# Patient Record
Sex: Male | Born: 1950
Health system: Southern US, Community
[De-identification: ages and names within clinical notes are randomized; demographics above are authoritative.]

## PROBLEM LIST (undated history)

## (undated) DIAGNOSIS — Z789 Other specified health status: Secondary | ICD-10-CM

## (undated) DIAGNOSIS — I700 Atherosclerosis of aorta: Secondary | ICD-10-CM

## (undated) DIAGNOSIS — K5790 Diverticulosis of intestine, part unspecified, without perforation or abscess without bleeding: Secondary | ICD-10-CM

## (undated) DIAGNOSIS — R0600 Dyspnea, unspecified: Secondary | ICD-10-CM

## (undated) DIAGNOSIS — I2510 Atherosclerotic heart disease of native coronary artery without angina pectoris: Secondary | ICD-10-CM

## (undated) DIAGNOSIS — C679 Malignant neoplasm of bladder, unspecified: Secondary | ICD-10-CM

## (undated) DIAGNOSIS — M1990 Unspecified osteoarthritis, unspecified site: Secondary | ICD-10-CM

## (undated) DIAGNOSIS — H269 Unspecified cataract: Secondary | ICD-10-CM

## (undated) DIAGNOSIS — E785 Hyperlipidemia, unspecified: Secondary | ICD-10-CM

## (undated) DIAGNOSIS — J309 Allergic rhinitis, unspecified: Secondary | ICD-10-CM

## (undated) DIAGNOSIS — Z9889 Other specified postprocedural states: Secondary | ICD-10-CM

## (undated) DIAGNOSIS — R112 Nausea with vomiting, unspecified: Secondary | ICD-10-CM

## (undated) DIAGNOSIS — F419 Anxiety disorder, unspecified: Secondary | ICD-10-CM

## (undated) DIAGNOSIS — F32A Depression, unspecified: Secondary | ICD-10-CM

## (undated) HISTORY — DX: Unspecified cataract: H26.9

## (undated) HISTORY — DX: Depression, unspecified: F32.A

## (undated) HISTORY — DX: Anxiety disorder, unspecified: F41.9

## (undated) HISTORY — PX: FRACTURE SURGERY: SHX138

## (undated) HISTORY — DX: Malignant neoplasm of bladder, unspecified: C67.9

---

## 1971-06-12 HISTORY — PX: APPENDECTOMY: SHX54

## 1985-06-11 HISTORY — PX: VASECTOMY: SHX75

## 1998-06-11 HISTORY — PX: HERNIA REPAIR: SHX51

## 1999-06-12 HISTORY — PX: FINGER SURGERY: SHX640

## 2004-06-01 ENCOUNTER — Ambulatory Visit: Payer: Self-pay | Admitting: Internal Medicine

## 2007-01-28 ENCOUNTER — Ambulatory Visit: Payer: Self-pay | Admitting: General Surgery

## 2007-06-12 HISTORY — PX: SHOULDER SURGERY: SHX246

## 2008-11-23 ENCOUNTER — Ambulatory Visit: Payer: Self-pay | Admitting: Family Medicine

## 2009-01-03 ENCOUNTER — Ambulatory Visit: Payer: Self-pay | Admitting: Gastroenterology

## 2010-09-26 ENCOUNTER — Observation Stay: Payer: Self-pay | Admitting: Internal Medicine

## 2011-01-15 ENCOUNTER — Ambulatory Visit: Payer: Self-pay | Admitting: Family Medicine

## 2013-03-09 ENCOUNTER — Ambulatory Visit: Payer: Self-pay | Admitting: Family Medicine

## 2015-04-07 DIAGNOSIS — L821 Other seborrheic keratosis: Secondary | ICD-10-CM | POA: Diagnosis not present

## 2015-04-07 DIAGNOSIS — D225 Melanocytic nevi of trunk: Secondary | ICD-10-CM | POA: Diagnosis not present

## 2015-04-07 DIAGNOSIS — D2271 Melanocytic nevi of right lower limb, including hip: Secondary | ICD-10-CM | POA: Diagnosis not present

## 2015-04-07 DIAGNOSIS — D2272 Melanocytic nevi of left lower limb, including hip: Secondary | ICD-10-CM | POA: Diagnosis not present

## 2015-12-29 ENCOUNTER — Telehealth: Payer: Self-pay | Admitting: Family Medicine

## 2015-12-29 NOTE — Telephone Encounter (Signed)
Pt states he is getting ready to have a grand baby and is need to to verify if he has had the vaccine for Varicella.  [id number]

## 2015-12-29 NOTE — Telephone Encounter (Signed)
No record of Tdap or varicella? documented in EMR. LMTCB to advise pt. Renaldo Fiddler, CMA

## 2015-12-30 ENCOUNTER — Ambulatory Visit (INDEPENDENT_AMBULATORY_CARE_PROVIDER_SITE_OTHER): Payer: Medicare Other | Admitting: Family Medicine

## 2015-12-30 ENCOUNTER — Other Ambulatory Visit: Payer: Self-pay

## 2015-12-30 DIAGNOSIS — G4733 Obstructive sleep apnea (adult) (pediatric): Secondary | ICD-10-CM | POA: Insufficient documentation

## 2015-12-30 DIAGNOSIS — R319 Hematuria, unspecified: Secondary | ICD-10-CM | POA: Insufficient documentation

## 2015-12-30 DIAGNOSIS — E785 Hyperlipidemia, unspecified: Secondary | ICD-10-CM | POA: Insufficient documentation

## 2015-12-30 DIAGNOSIS — X58XXXA Exposure to other specified factors, initial encounter: Secondary | ICD-10-CM | POA: Insufficient documentation

## 2015-12-30 DIAGNOSIS — H6980 Other specified disorders of Eustachian tube, unspecified ear: Secondary | ICD-10-CM | POA: Insufficient documentation

## 2015-12-30 DIAGNOSIS — Z23 Encounter for immunization: Secondary | ICD-10-CM

## 2015-12-30 DIAGNOSIS — J309 Allergic rhinitis, unspecified: Secondary | ICD-10-CM | POA: Insufficient documentation

## 2016-01-02 NOTE — Progress Notes (Signed)
Patient ID: Francis Jones, male   DOB: 12/31/1950, 65 y.o.   MRN: [id number] Here for CMA vist for Tdap only.

## 2016-05-01 ENCOUNTER — Ambulatory Visit (INDEPENDENT_AMBULATORY_CARE_PROVIDER_SITE_OTHER): Payer: Medicare Other | Admitting: Family Medicine

## 2016-05-01 DIAGNOSIS — Z23 Encounter for immunization: Secondary | ICD-10-CM | POA: Diagnosis not present

## 2016-05-23 DIAGNOSIS — D2261 Melanocytic nevi of right upper limb, including shoulder: Secondary | ICD-10-CM | POA: Diagnosis not present

## 2016-05-23 DIAGNOSIS — X32XXXA Exposure to sunlight, initial encounter: Secondary | ICD-10-CM | POA: Diagnosis not present

## 2016-05-23 DIAGNOSIS — L570 Actinic keratosis: Secondary | ICD-10-CM | POA: Diagnosis not present

## 2016-05-23 DIAGNOSIS — L300 Nummular dermatitis: Secondary | ICD-10-CM | POA: Diagnosis not present

## 2016-05-23 DIAGNOSIS — D2272 Melanocytic nevi of left lower limb, including hip: Secondary | ICD-10-CM | POA: Diagnosis not present

## 2016-05-23 DIAGNOSIS — D225 Melanocytic nevi of trunk: Secondary | ICD-10-CM | POA: Diagnosis not present

## 2016-11-20 ENCOUNTER — Encounter: Payer: Self-pay | Admitting: Family Medicine

## 2016-11-20 ENCOUNTER — Ambulatory Visit (INDEPENDENT_AMBULATORY_CARE_PROVIDER_SITE_OTHER): Payer: Medicare Other | Admitting: Family Medicine

## 2016-11-20 ENCOUNTER — Other Ambulatory Visit: Payer: Self-pay | Admitting: Family Medicine

## 2016-11-20 VITALS — BP 126/92 | HR 58 | Temp 97.5°F | Resp 16 | Wt 265.0 lb

## 2016-11-20 DIAGNOSIS — H8110 Benign paroxysmal vertigo, unspecified ear: Secondary | ICD-10-CM

## 2016-11-20 MED ORDER — MECLIZINE HCL 25 MG PO TABS: 25.0000 mg | ORAL_TABLET | Freq: Three times a day (TID) | ORAL | 0 refills | Status: DC | PRN

## 2016-11-20 NOTE — Patient Instructions (Addendum)
We will call you with the lab results. Consider adding a steroid nasal spray for your allergy symptoms.

## 2016-11-20 NOTE — Progress Notes (Signed)
Subjective:     Patient ID: Francis Jones, male   DOB: [birth year], 66 y.o.   MRN: [id number]  HPI  Chief Complaint  Patient presents with  . Hypertension    Patient comes in office today with concerns of elevated blood pressure for the past 2 hrs. Patient states that when he awoke this morning he felt dizzy, patient states that blood pressure was 168/95. Patient denies being under any more stress or starting of new medication or past history of hypertension. Associated with dizziness patient reports 1 episode of vomiting this morning.   States he started getting dizzy last night when he went to bed. When he awoke lying on his back this AM he experienced vertigo. This improved after he got up but has continued to feel inwardly dizzy and off-balance. No change in hearing but reports chronic tinnitus (has had prior ENT evaluation). Also reports mild allergy sx and mild left upper tooth discomfort (has had negative dental x-rays).   Review of Systems     Objective:   Physical Exam  Constitutional: He appears well-developed and well-nourished. He has a sickly appearance.  HENT:  Ear canals patent and TM's intact without inflammation  Eyes: EOM are normal. Pupils are equal, round, and reactive to light.  EOM testing provokes vertigo to left gaze  Neck:  No carotid bruits  Cardiovascular: Normal rate and regular rhythm.   Pulmonary/Chest: Breath sounds normal.  Neurological: Coordination (Finger to Nose and heel to toe WNL. [place] negative) normal.       Assessment:    1. Benign paroxysmal positional vertigo, unspecified laterality - meclizine (ANTIVERT) 25 MG tablet; Take 1 tablet (25 mg total) by mouth 3 (three) times daily as needed for dizziness.  Dispense: 30 tablet; Refill: 0 - Renal function panel    Plan:    Further f/u pending lab work. Not clear why he is on potassium supplement.

## 2016-11-21 LAB — RENAL FUNCTION PANEL
Albumin: 4.4 g/dL (ref 3.6–4.8)
BUN/Creatinine Ratio: 16 (ref 10–24)
BUN: 13 mg/dL (ref 8–27)
CO2: 25 mmol/L (ref 20–29)
CREATININE: 0.83 mg/dL (ref 0.76–1.27)
Calcium: 9.4 mg/dL (ref 8.6–10.2)
Chloride: 104 mmol/L (ref 96–106)
GFR calc Af Amer: 106 mL/min/{1.73_m2} (ref >59)
GFR, EST NON AFRICAN AMERICAN: 92 mL/min/{1.73_m2} (ref >59)
Glucose: 122 mg/dL — ABNORMAL HIGH (ref 65–99)
PHOSPHORUS: 3.1 mg/dL (ref 2.5–4.5)
Potassium: 4.8 mmol/L (ref 3.5–5.2)
SODIUM: 142 mmol/L (ref 134–144)

## 2016-11-27 ENCOUNTER — Other Ambulatory Visit: Payer: Self-pay | Admitting: Family Medicine

## 2016-11-27 ENCOUNTER — Telehealth: Payer: Self-pay | Admitting: Family Medicine

## 2016-11-27 DIAGNOSIS — H8110 Benign paroxysmal vertigo, unspecified ear: Secondary | ICD-10-CM

## 2016-11-27 NOTE — Telephone Encounter (Signed)
Will refer to ENT, Dr.Vaught.His positional vertigo persists.

## 2016-11-27 NOTE — Telephone Encounter (Signed)
Please review. KW 

## 2016-11-27 NOTE — Telephone Encounter (Signed)
Pt would like to talk to [facility] regarding his vertigo.  Pt's call back  [phone]  [facility]

## 2016-12-03 DIAGNOSIS — H6980 Other specified disorders of Eustachian tube, unspecified ear: Secondary | ICD-10-CM | POA: Diagnosis not present

## 2016-12-03 DIAGNOSIS — H8112 Benign paroxysmal vertigo, left ear: Secondary | ICD-10-CM | POA: Diagnosis not present

## 2016-12-03 DIAGNOSIS — H9312 Tinnitus, left ear: Secondary | ICD-10-CM | POA: Diagnosis not present

## 2016-12-03 DIAGNOSIS — R42 Dizziness and giddiness: Secondary | ICD-10-CM | POA: Diagnosis not present

## 2016-12-10 DIAGNOSIS — H8111 Benign paroxysmal vertigo, right ear: Secondary | ICD-10-CM | POA: Diagnosis not present

## 2016-12-19 DIAGNOSIS — H8111 Benign paroxysmal vertigo, right ear: Secondary | ICD-10-CM | POA: Diagnosis not present

## 2017-02-08 ENCOUNTER — Encounter: Payer: Self-pay | Admitting: Family Medicine

## 2017-03-26 ENCOUNTER — Encounter: Payer: Self-pay | Admitting: Family Medicine

## 2017-05-22 DIAGNOSIS — L821 Other seborrheic keratosis: Secondary | ICD-10-CM | POA: Diagnosis not present

## 2017-05-22 DIAGNOSIS — D225 Melanocytic nevi of trunk: Secondary | ICD-10-CM | POA: Diagnosis not present

## 2017-05-22 DIAGNOSIS — D2272 Melanocytic nevi of left lower limb, including hip: Secondary | ICD-10-CM | POA: Diagnosis not present

## 2017-05-22 DIAGNOSIS — X32XXXA Exposure to sunlight, initial encounter: Secondary | ICD-10-CM | POA: Diagnosis not present

## 2017-05-22 DIAGNOSIS — L570 Actinic keratosis: Secondary | ICD-10-CM | POA: Diagnosis not present

## 2017-05-22 DIAGNOSIS — D2261 Melanocytic nevi of right upper limb, including shoulder: Secondary | ICD-10-CM | POA: Diagnosis not present

## 2017-09-11 DIAGNOSIS — H2513 Age-related nuclear cataract, bilateral: Secondary | ICD-10-CM | POA: Diagnosis not present

## 2017-12-20 ENCOUNTER — Encounter: Payer: Self-pay | Admitting: Family Medicine

## 2017-12-20 ENCOUNTER — Telehealth: Payer: Self-pay | Admitting: Family Medicine

## 2017-12-20 NOTE — Telephone Encounter (Signed)
Opened in error

## 2017-12-23 ENCOUNTER — Telehealth: Payer: Self-pay | Admitting: Family Medicine

## 2017-12-23 ENCOUNTER — Other Ambulatory Visit: Payer: Self-pay | Admitting: Family Medicine

## 2017-12-23 DIAGNOSIS — R310 Gross hematuria: Secondary | ICD-10-CM

## 2017-12-23 NOTE — Telephone Encounter (Signed)
Referral in progress. 

## 2017-12-23 NOTE — Telephone Encounter (Signed)
Pt states he has blood in the urine again.  States since he has spoke with Mikki Santee it has cleared up but was referred to [place] by Mikki Santee.  Pt states he called [facility] and they are requesting a referral sent over from our office.

## 2017-12-23 NOTE — Telephone Encounter (Signed)
Please review and advise. KW 

## 2018-01-06 NOTE — Progress Notes (Signed)
01/07/2018 2:25 PM   Francis Jones [birth year] [id number]  Referring provider: Carmon Ginsberg, [address] [place] [address] [place], [place] [address]  Chief Complaint  Patient presents with  . Hematuria    HPI: Patient is a 67 -year-old Caucasian male who presents today as a referral from their Carmon Ginsberg, [place] for gross hematuria.    Patient states that he had an incident of gross hematuria that abated three years ago.    He had the return of the gross hematuria with the passage of clots.  He increased his water intake.  It has since cleared.    He does not have a prior history of recurrent urinary tract infections, nephrolithiasis, trauma to the genitourinary tract, BPH or malignancies of the genitourinary tract.   He does not have a family medical history of nephrolithiasis, malignancies of the genitourinary tract or hematuria.   Today, he is having urgency.  Patient denies any gross hematuria, dysuria or suprapubic/flank pain.  Patient denies any fevers, chills, nausea or vomiting. His UA today demonstrates >30 RBC's.    He is a former smoker, with a 2 ppd history.  Quit 40 years ago.  He is not exposed to secondhand smoke.  He has not worked with chemicals.   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  He has a high BMI.     PMH: History reviewed. No pertinent past medical history.  Surgical History: Past Surgical History:  Procedure Laterality Date  . APPENDECTOMY  1973  . FINGER SURGERY Left 2001  . HERNIA REPAIR  2000  . SHOULDER SURGERY Left 2009  . VASECTOMY  1987    Home Medications:  Allergies as of 01/07/2018      Reactions   Oxycodone Nausea Only   Hydrocodone-acetaminophen Nausea And Vomiting   Other Nausea Only, Other (See Comments)   Drugs that end in "one"      Medication List        Accurate as of 01/07/18  2:25 PM. Always use your most recent med list.          ALLEGRA ALLERGY 60 MG  tablet Generic drug:  fexofenadine Take by mouth.   aspirin 81 MG tablet Take by mouth.   cholecalciferol 1000 units tablet Commonly known as:  VITAMIN D Take by mouth.   Cyanocobalamin 1000 MCG Caps Take by mouth.   loratadine 10 MG tablet Commonly known as:  CLARITIN Take by mouth.   meclizine 25 MG tablet Commonly known as:  ANTIVERT Take 1 tablet (25 mg total) by mouth 3 (three) times daily as needed for dizziness.   MULTIPLE VITAMIN PO Take by mouth.   OMEGA-3 FATTY ACIDS PO Take by mouth.   potassium chloride 10 MEQ CR capsule Commonly known as:  MICRO-K Take by mouth.       Allergies:  Allergies  Allergen Reactions  . Oxycodone Nausea Only  . Hydrocodone-Acetaminophen Nausea And Vomiting  . Other Nausea Only and Other (See Comments)    Drugs that end in "one"    Family History: History reviewed. No pertinent family history.  Social History:  reports that he has never smoked. He has never used smokeless tobacco. His alcohol and drug histories are not on file.  ROS: UROLOGY Frequent Urination?: No Hard to postpone urination?: Yes Burning/pain with urination?: No Get up at night to urinate?: No Leakage of urine?: No Urine stream starts and stops?: No Trouble starting  stream?: No Do you have to strain to urinate?: No Blood in urine?: Yes Urinary tract infection?: No Sexually transmitted disease?: No Injury to kidneys or bladder?: No Painful intercourse?: No Weak stream?: No Erection problems?: No Penile pain?: No  Gastrointestinal Nausea?: No Vomiting?: No Indigestion/heartburn?: No Diarrhea?: No Constipation?: Yes  Constitutional Fever: No Night sweats?: No Weight loss?: No Fatigue?: No  Skin Skin rash/lesions?: No Itching?: No  Eyes Blurred vision?: No Double vision?: No  Ears/Nose/Throat Sore throat?: No Sinus problems?: No  Hematologic/Lymphatic Swollen glands?: No Easy bruising?: No  Cardiovascular Leg  swelling?: No Chest pain?: Yes  Respiratory Cough?: No Shortness of breath?: Yes  Endocrine Excessive thirst?: No  Musculoskeletal Back pain?: Yes Joint pain?: Yes  Neurological Headaches?: No Dizziness?: No  Psychologic Depression?: No Anxiety?: No  Physical Exam: BP 132/77   Pulse 69   Ht 6' (1.829 m)   Wt 272 lb 9.6 oz (123.7 kg)   BMI 36.97 kg/m   Constitutional:  Well nourished. Alert and oriented, No acute distress. HEENT: [place] AT, moist mucus membranes.  Trachea midline, no masses. Cardiovascular: No clubbing, cyanosis, or edema. Respiratory: Normal respiratory effort, no increased work of breathing. GI: Abdomen is soft, non tender, non distended, no abdominal masses. Liver and spleen not palpable.  No hernias appreciated.  Stool sample for occult testing is not indicated.   GU: No CVA tenderness.  No bladder fullness or masses.  Patient with circumcised phallus.  Urethral meatus is patent.  No penile discharge. No penile lesions or rashes. Scrotum without lesions, cysts, rashes and/or edema.  Testicles are located scrotally bilaterally. No masses are appreciated in the testicles. Left and right epididymis are normal. Rectal: Patient with  normal sphincter tone. Anus and perineum without scarring or rashes. No rectal masses are appreciated. Prostate is approximately 50 grams, could only palpate the apex, no nodules are appreciated.  Skin: No rashes, bruises or suspicious lesions. Lymph: No cervical or inguinal adenopathy. Neurologic: Grossly intact, no focal deficits, moving all 4 extremities. Psychiatric: Normal mood and affect.  Laboratory Data: No results found for: WBC, HGB, HCT, MCV, PLT  Lab Results  Component Value Date   CREATININE 0.83 11/20/2016    No results found for: PSA  No results found for: TESTOSTERONE  No results found for: HGBA1C  No results found for: TSH  No results found for: CHOL, HDL, CHOLHDL, VLDL, LDLCALC  No results found for:  AST No results found for: ALT No components found for: ALKALINEPHOPHATASE No components found for: BILIRUBINTOTAL  No results found for: ESTRADIOL   Urinalysis > 30 RBC's.  See Epic.  See HPI.    I have reviewed the labs.   Assessment & Plan:    1. Gross hematuria Explained to the patient that there are a number of causes that can be associated with blood in the urine, such as stones,  BPH, UTI's, damage to the urinary tract and/or cancer. At this time, I felt that the patient warranted further urologic evaluation.   The AUA guidelines state that a CT urogram is the preferred imaging study to evaluate hematuria. I explained to the patient that a contrast material will be injected into a vein and that in rare instances, an allergic reaction can result and may even life threatening   The patient denies any allergies to contrast, iodine and/or seafood and is not taking metformin. Following the imaging study,  I've recommended a cystoscopy. I described how this is performed, typically in an office  setting with a flexible cystoscope. We described the risks, benefits, and possible side effects, the most common of which is a minor amount of blood in the urine and/or burning which usually resolves in 24 to 48 hours.   The patient had the opportunity to ask questions which were answered. Based upon this discussion, the patient is willing to proceed. Therefore, I've ordered: a CT Urogram and cystoscopy.  The patient will return following all of the above for discussion of the results.  UA > 30 RBC's Urine culture pending BUN + creatinine pending  Patient prefers Tramadol for pain   Return for CT Urogram report and cystoscopy.  These notes generated with voice recognition software. I apologize for typographical errors.  Zara Council, PA-C  [facility] [address] [place]  [place], [place] [address] [phone]

## 2018-01-07 ENCOUNTER — Encounter: Payer: Self-pay | Admitting: Urology

## 2018-01-07 ENCOUNTER — Ambulatory Visit (INDEPENDENT_AMBULATORY_CARE_PROVIDER_SITE_OTHER): Payer: Medicare Other | Admitting: Urology

## 2018-01-07 ENCOUNTER — Other Ambulatory Visit: Payer: Self-pay

## 2018-01-07 VITALS — BP 132/77 | HR 69 | Ht 72.0 in | Wt 272.6 lb

## 2018-01-07 DIAGNOSIS — R310 Gross hematuria: Secondary | ICD-10-CM

## 2018-01-07 NOTE — Patient Instructions (Signed)
Hematuria, Adult Hematuria is blood in your urine. It can be caused by a bladder infection, kidney infection, prostate infection, kidney stone, or cancer of your urinary tract. Infections can usually be treated with medicine, and a kidney stone usually will pass through your urine. If neither of these is the cause of your hematuria, further workup to find out the reason may be needed. It is very important that you tell your health care provider about any blood you see in your urine, even if the blood stops without treatment or happens without causing pain. Blood in your urine that happens and then stops and then happens again can be a symptom of a very serious condition. Also, pain is not a symptom in the initial stages of many urinary cancers. Follow these instructions at home:  Drink lots of fluid, 3-4 quarts a day. If you have been diagnosed with an infection, cranberry juice is especially recommended, in addition to large amounts of water.  Avoid caffeine, tea, and carbonated beverages because they tend to irritate the bladder.  Avoid alcohol because it may irritate the prostate.  Take all medicines as directed by your health care provider.  If you were prescribed an antibiotic medicine, finish it all even if you start to feel better.  If you have been diagnosed with a kidney stone, follow your health care provider's instructions regarding straining your urine to catch the stone.  Empty your bladder often. Avoid holding urine for long periods of time.  After a bowel movement, women should cleanse front to back. Use each tissue only once.  Empty your bladder before and after sexual intercourse if you are a male. Contact a health care provider if:  You develop back pain.  You have a fever.  You have a feeling of sickness in your stomach (nausea) or vomiting.  Your symptoms are not better in 3 days. Return sooner if you are getting worse. Get help right away if:  You develop  severe vomiting and are unable to keep the medicine down.  You develop severe back or abdominal pain despite taking your medicines.  You begin passing a large amount of blood or clots in your urine.  You feel extremely weak or faint, or you pass out. This information is not intended to replace advice given to you by your health care provider. Make sure you discuss any questions you have with your health care provider. Document Released: 05/28/2005 Document Revised: 11/03/2015 Document Reviewed: 01/26/2013 Elsevier Interactive Patient Education  2017 [employer]  CT Scan A CT scan (computed tomography scan) is an imaging scan. It uses X-rays and a computer to make detailed pictures of different areas inside the body. A CT scan can give more information than a regular X-ray exam. A CT scan provides data about internal organs, soft tissue structures, blood vessels, and bones. In this procedure, the pictures will be taken in a large machine that has an opening (CT scanner). Tell a health care provider about:  Any allergies you have.  All medicines you are taking, including vitamins, herbs, eye drops, creams, and over-the-counter medicines.  Any blood disorders you have.  Any surgeries you have had.  Any medical conditions you have.  Whether you are pregnant or may be pregnant. What are the risks? Generally, this is a safe procedure. However, problems may occur, including:  An allergic reaction to dyes.  Development of cancer from excessive exposure to radiation from multiple CT scans. This is rare.  What happens before   the procedure? Staying hydrated Follow instructions from your health care provider about hydration, which may include:  Up to 2 hours before the procedure - you may continue to drink clear liquids, such as water, clear fruit juice, black coffee, and plain tea.  Eating and drinking restrictions Follow instructions from your health care provider about eating and  drinking, which may include:  24 hours before the procedure - stop drinking caffeinated beverages, such as energy drinks, tea, soda, coffee, and hot chocolate.  8 hours before the procedure - stop eating heavy meals or foods such as meat, fried foods, or fatty foods.  6 hours before the procedure - stop eating light meals or foods, such as toast or cereal.  6 hours before the procedure - stop drinking milk or drinks that contain milk.  2 hours before the procedure - stop drinking clear liquids.  General instructions  Remove any jewelry.  Ask your health care provider about changing or stopping your regular medicines. This is especially important if you are taking diabetes medicines or blood thinners. What happens during the procedure?  You will lie on a table with your arms above your head.  An IV tube may be inserted into one of your veins.  The contrast dye may be injected into the IV tube. You may feel warm or have a metallic taste in your mouth.  The table you will be lying on will move into the CT scanner.  You will be able to see, hear, and talk to the person running the machine while you are in it. Follow that person's instructions.  The CT scanner will move around you to take pictures. Do not move while it is scanning. Staying still helps the scanner to get a good image.  When the best possible pictures have been taken, the machine will be turned off. The table will be moved out of the machine.  The IV tube will be removed. The procedure may vary among health care providers and hospitals. What happens after the procedure?  It is up to you to get the results of your procedure. Ask your health care provider, or the department that is doing the procedure, when your results will be ready. Summary  A CT scan is an imaging scan.  A CT scan uses X-rays and a computer to make detailed pictures of different areas of your body.  Follow instructions from your health care  provider about eating and drinking before the procedure.  You will be able to see, hear, and talk to the person running the machine while you are in it. Follow that person's instructions. This information is not intended to replace advice given to you by your health care provider. Make sure you discuss any questions you have with your health care provider. Document Released: 07/05/2004 Document Revised: 06/30/2016 Document Reviewed: 06/30/2016 Elsevier Interactive Patient Education  2018 [employer]  Cystoscopy Cystoscopy is a procedure that is used to help diagnose and sometimes treat conditions that affect that lower urinary tract. The lower urinary tract includes the bladder and the tube that drains urine from the bladder out of the body (urethra). Cystoscopy is performed with a thin, tube-shaped instrument with a light and camera at the end (cystoscope). The cystoscope may be hard (rigid) or flexible, depending on the goal of the procedure.The cystoscope is inserted through the urethra, into the bladder. Cystoscopy may be recommended if you have:  Urinary tractinfections that keep coming back (recurring).  Blood in the urine (hematuria).    Loss of bladder control (urinary incontinence) or an overactive bladder.  Unusual cells found in a urine sample.  A blockage in the urethra.  Painful urination.  An abnormality in the bladder found during an intravenous pyelogram (IVP) or CT scan.  Cystoscopy may also be done to remove a sample of tissue to be examined under a microscope (biopsy). Tell a health care provider about:  Any allergies you have.  All medicines you are taking, including vitamins, herbs, eye drops, creams, and over-the-counter medicines.  Any problems you or family members have had with anesthetic medicines.  Any blood disorders you have.  Any surgeries you have had.  Any medical conditions you have.  Whether you are pregnant or may be pregnant. What are the  risks? Generally, this is a safe procedure. However, problems may occur, including:  Infection.  Bleeding.  Allergic reactions to medicines.  Damage to other structures or organs.  What happens before the procedure?  Ask your health care provider about: ? Changing or stopping your regular medicines. This is especially important if you are taking diabetes medicines or blood thinners. ? Taking medicines such as aspirin and ibuprofen. These medicines can thin your blood. Do not take these medicines before your procedure if your health care provider instructs you not to.  Follow instructions from your health care provider about eating or drinking restrictions.  You may be given antibiotic medicine to help prevent infection.  You may have an exam or testing, such as X-rays of the bladder, urethra, or kidneys.  You may have urine tests to check for signs of infection.  Plan to have someone take you home after the procedure. What happens during the procedure?  To reduce your risk of infection,your health care team will wash or sanitize their hands.  You will be given one or more of the following: ? A medicine to help you relax (sedative). ? A medicine to numb the area (local anesthetic).  The area around the opening of your urethra will be cleaned.  The cystoscope will be passed through your urethra into your bladder.  Germ-free (sterile)fluid will flow through the cystoscope to fill your bladder. The fluid will stretch your bladder so that your surgeon can clearly examine your bladder walls.  The cystoscope will be removed and your bladder will be emptied. The procedure may vary among health care providers and hospitals. What happens after the procedure?  You may have some soreness or pain in your abdomen and urethra. Medicines will be available to help you.  You may have some blood in your urine.  Do not drive for 24 hours if you received a sedative. This information is  not intended to replace advice given to you by your health care provider. Make sure you discuss any questions you have with your health care provider. Document Released: 05/25/2000 Document Revised: 10/06/2015 Document Reviewed: 04/14/2015 Elsevier Interactive Patient Education  [employer].  Cystoscopy Cystoscopy is a procedure that is used to help diagnose and sometimes treat conditions that affect that lower urinary tract. The lower urinary tract includes the bladder and the tube that drains urine from the bladder out of the body (urethra). Cystoscopy is performed with a thin, tube-shaped instrument with a light and camera at the end (cystoscope). The cystoscope may be hard (rigid) or flexible, depending on the goal of the procedure.The cystoscope is inserted through the urethra, into the bladder. Cystoscopy may be recommended if you have:  Urinary tractinfections that keep coming back (  recurring).  Blood in the urine (hematuria).  Loss of bladder control (urinary incontinence) or an overactive bladder.  Unusual cells found in a urine sample.  A blockage in the urethra.  Painful urination.  An abnormality in the bladder found during an intravenous pyelogram (IVP) or CT scan.  Cystoscopy may also be done to remove a sample of tissue to be examined under a microscope (biopsy). Tell a health care provider about:  Any allergies you have.  All medicines you are taking, including vitamins, herbs, eye drops, creams, and over-the-counter medicines.  Any problems you or family members have had with anesthetic medicines.  Any blood disorders you have.  Any surgeries you have had.  Any medical conditions you have.  Whether you are pregnant or may be pregnant. What are the risks? Generally, this is a safe procedure. However, problems may occur, including:  Infection.  Bleeding.  Allergic reactions to medicines.  Damage to other structures or organs.  What happens  before the procedure?  Ask your health care provider about: ? Changing or stopping your regular medicines. This is especially important if you are taking diabetes medicines or blood thinners. ? Taking medicines such as aspirin and ibuprofen. These medicines can thin your blood. Do not take these medicines before your procedure if your health care provider instructs you not to.  Follow instructions from your health care provider about eating or drinking restrictions.  You may be given antibiotic medicine to help prevent infection.  You may have an exam or testing, such as X-rays of the bladder, urethra, or kidneys.  You may have urine tests to check for signs of infection.  Plan to have someone take you home after the procedure. What happens during the procedure?  To reduce your risk of infection,your health care team will wash or sanitize their hands.  You will be given one or more of the following: ? A medicine to help you relax (sedative). ? A medicine to numb the area (local anesthetic).  The area around the opening of your urethra will be cleaned.  The cystoscope will be passed through your urethra into your bladder.  Germ-free (sterile)fluid will flow through the cystoscope to fill your bladder. The fluid will stretch your bladder so that your surgeon can clearly examine your bladder walls.  The cystoscope will be removed and your bladder will be emptied. The procedure may vary among health care providers and hospitals. What happens after the procedure?  You may have some soreness or pain in your abdomen and urethra. Medicines will be available to help you.  You may have some blood in your urine.  Do not drive for 24 hours if you received a sedative. This information is not intended to replace advice given to you by your health care provider. Make sure you discuss any questions you have with your health care provider. Document Released: 05/25/2000 Document Revised:  10/06/2015 Document Reviewed: 04/14/2015 Elsevier Interactive Patient Education  [employer].

## 2018-01-08 LAB — MICROSCOPIC EXAMINATION: Epithelial Cells (non renal): NONE SEEN /hpf (ref 0–10)

## 2018-01-08 LAB — URINALYSIS, COMPLETE
BILIRUBIN UA: NEGATIVE
Glucose, UA: NEGATIVE
KETONES UA: NEGATIVE
LEUKOCYTES UA: NEGATIVE
NITRITE UA: NEGATIVE
PH UA: 6 (ref 5.0–7.5)
Protein, UA: NEGATIVE
UUROB: 0.2 mg/dL (ref 0.2–1.0)

## 2018-01-08 LAB — BUN+CREAT
BUN/Creatinine Ratio: 15 (ref 10–24)
BUN: 12 mg/dL (ref 8–27)
CREATININE: 0.8 mg/dL (ref 0.76–1.27)
GFR calc Af Amer: 107 mL/min/{1.73_m2} (ref >59)
GFR, EST NON AFRICAN AMERICAN: 92 mL/min/{1.73_m2} (ref >59)

## 2018-01-09 ENCOUNTER — Encounter: Payer: Self-pay | Admitting: Urology

## 2018-01-09 ENCOUNTER — Telehealth: Payer: Self-pay | Admitting: Family Medicine

## 2018-01-09 NOTE — Telephone Encounter (Signed)
-----   Message from Nori Riis, PA-C sent at 01/09/2018  8:23 AM EDT ----- Please let Mr. Turnbaugh know that I am working to get his appointment moved up.  We have availability next Thursday at 3 pm for the cystoscopy and will need to get his CT scan done prior to this appointment.  Let me know if this works for him.

## 2018-01-09 NOTE — Telephone Encounter (Signed)
Left message with spouse to have patient call back to confirm appointment time.

## 2018-01-10 LAB — CULTURE, URINE COMPREHENSIVE

## 2018-01-15 ENCOUNTER — Ambulatory Visit
Admission: RE | Admit: 2018-01-15 | Discharge: 2018-01-15 | Disposition: A | Discharge status: Home / Self Care | Payer: Medicare Other | Source: Ambulatory Visit | Attending: Urology | Admitting: Urology

## 2018-01-15 DIAGNOSIS — D414 Neoplasm of uncertain behavior of bladder: Secondary | ICD-10-CM | POA: Insufficient documentation

## 2018-01-15 DIAGNOSIS — R918 Other nonspecific abnormal finding of lung field: Secondary | ICD-10-CM | POA: Insufficient documentation

## 2018-01-15 DIAGNOSIS — R310 Gross hematuria: Secondary | ICD-10-CM | POA: Diagnosis not present

## 2018-01-15 DIAGNOSIS — I700 Atherosclerosis of aorta: Secondary | ICD-10-CM | POA: Diagnosis not present

## 2018-01-15 DIAGNOSIS — K5730 Diverticulosis of large intestine without perforation or abscess without bleeding: Secondary | ICD-10-CM | POA: Insufficient documentation

## 2018-01-15 MED ORDER — IOPAMIDOL (ISOVUE-300) INJECTION 61%
125.0000 mL | Freq: Once | INTRAVENOUS | Status: AC | PRN
  Administered 2018-01-15: 125 mL via INTRAVENOUS

## 2018-01-16 ENCOUNTER — Ambulatory Visit (INDEPENDENT_AMBULATORY_CARE_PROVIDER_SITE_OTHER): Payer: Medicare Other | Admitting: Urology

## 2018-01-16 ENCOUNTER — Encounter: Payer: Self-pay | Admitting: Urology

## 2018-01-16 VITALS — BP 151/74 | HR 69 | Ht 72.0 in | Wt 273.9 lb

## 2018-01-16 DIAGNOSIS — R310 Gross hematuria: Secondary | ICD-10-CM

## 2018-01-16 DIAGNOSIS — D414 Neoplasm of uncertain behavior of bladder: Secondary | ICD-10-CM | POA: Diagnosis not present

## 2018-01-16 LAB — URINALYSIS, COMPLETE
Bilirubin, UA: NEGATIVE
Glucose, UA: NEGATIVE
KETONES UA: NEGATIVE
LEUKOCYTES UA: NEGATIVE
NITRITE UA: NEGATIVE
SPEC GRAV UA: 1.02 (ref 1.005–1.030)
Urobilinogen, Ur: 0.2 mg/dL (ref 0.2–1.0)
pH, UA: 5.5 (ref 5.0–7.5)

## 2018-01-16 LAB — MICROSCOPIC EXAMINATION
Epithelial Cells (non renal): NONE SEEN /hpf (ref 0–10)
RBC, UA: >30 /hpf — ABNORMAL HIGH (ref 0–2)

## 2018-01-16 MED ORDER — CIPROFLOXACIN HCL 500 MG PO TABS
500.0000 mg | ORAL_TABLET | Freq: Once | ORAL | Status: AC
Start: 2018-01-16 — End: 2018-01-16
  Administered 2018-01-16: 500 mg via ORAL

## 2018-01-16 MED ORDER — LIDOCAINE HCL URETHRAL/MUCOSAL 2 % EX GEL
1.0000 "application " | Freq: Once | CUTANEOUS | Status: AC
  Administered 2018-01-16: 1 via URETHRAL

## 2018-01-16 NOTE — Progress Notes (Signed)
   01/16/18  CC:  Chief Complaint  Patient presents with  . Cysto    HPI:  Patient returns for cystoscopy.  Gross hematuria evaluation.  He has occasional urgency.  Former smoker.  He underwent CT scan of the abdomen and pelvis August seventh 2019 which revealed a 15 mm right posterior bladder tumor.  There were no worrisome features and no hydronephrosis.    There were no vitals taken for this visit. NED. A&Ox3.   No respiratory distress   Abd soft, NT, ND Normal phallus with bilateral descended testicles  Cystoscopy Procedure Note  Patient identification was confirmed, informed consent was obtained, and patient was prepped using Betadine solution.  Lidocaine jelly was administered per urethral meatus.    Preoperative abx where received prior to procedure.     Pre-Procedure: - Inspection reveals a normal caliber ureteral meatus.  Procedure: The flexible cystoscope was introduced without difficulty - No urethral strictures/lesions are present. - prostate mild obstruction - bladder neck patent - Bilateral ureteral orifices identified - small to medium Bladder tumor just lateral to right UO.  - No bladder stones - No trabeculation  Retroflexion shows normal bladder neck, no other tumors   Post-Procedure: - Patient tolerated the procedure well  Assessment/ Plan:  I discussed with the patient the nature of bladder neoplasms and the nature r/b/a to TURBT with post-op gemcitabine. We discussed he may need a ureteral stent and that one of my colleagues would likely be doing the procedure.  I sent urine for culture.

## 2018-01-17 ENCOUNTER — Other Ambulatory Visit: Payer: Self-pay | Admitting: Radiology

## 2018-01-17 DIAGNOSIS — D414 Neoplasm of uncertain behavior of bladder: Secondary | ICD-10-CM

## 2018-01-17 DIAGNOSIS — R310 Gross hematuria: Secondary | ICD-10-CM

## 2018-01-18 LAB — CULTURE, URINE COMPREHENSIVE

## 2018-01-27 ENCOUNTER — Encounter
Admission: RE | Admit: 2018-01-27 | Discharge: 2018-01-27 | Disposition: A | Discharge status: Home / Self Care | Payer: Medicare Other | Source: Ambulatory Visit | Attending: Urology | Admitting: Urology

## 2018-01-27 ENCOUNTER — Other Ambulatory Visit: Payer: Self-pay

## 2018-01-27 DIAGNOSIS — Z01810 Encounter for preprocedural cardiovascular examination: Secondary | ICD-10-CM | POA: Diagnosis not present

## 2018-01-27 DIAGNOSIS — Z01812 Encounter for preprocedural laboratory examination: Secondary | ICD-10-CM | POA: Insufficient documentation

## 2018-01-27 DIAGNOSIS — R54 Age-related physical debility: Secondary | ICD-10-CM | POA: Diagnosis not present

## 2018-01-27 HISTORY — DX: Other specified postprocedural states: Z98.890

## 2018-01-27 HISTORY — DX: Nausea with vomiting, unspecified: R11.2

## 2018-01-27 NOTE — Patient Instructions (Signed)
  Your procedure is scheduled on: Tuesday February 04, 2018 Report to Same Day Surgery 2nd floor medical mall ([place] [facility]-take elevator on left to 2nd floor.  Check in with surgery information desk.) To find out your arrival time please call [phone] between 1PM - 3PM on Monday February 03, 2018  Remember: Instructions that are not followed completely may result in serious medical risk, up to and including death, or upon the discretion of your surgeon and anesthesiologist your surgery may need to be rescheduled.    _x___ 1. Do not eat food (including mints, candies, chewing gum) after midnight the night before your procedure. You may drink clear liquids up to 2 hours before you are scheduled to arrive at the hospital for your procedure.  Do not drink clear liquids within 2 hours of your scheduled arrival to the hospital.  Clear liquids include  --Water or Apple juice without pulp  --Clear carbohydrate beverage such as Gatorade  --Black Coffee or Clear Tea (No milk, no creamers, do not add anything to the coffee or tea)    __x__ 2. No Alcohol for 24 hours before or after surgery.   __x__ 3. No Smoking or e-cigarettes for 24 prior to surgery.  Do not use any chewable tobacco products for at least 6 hour prior to surgery   __x__ 4. Notify your doctor if there is any change in your medical condition (cold, fever, infections).   __x__ 5. On the morning of surgery brush your teeth with toothpaste and water.  You may rinse your mouth with mouth wash if you wish.  Do not swallow any toothpaste or mouthwash.  Please read over the following fact sheets that you were given:   [facility] Preparing for Surgery and or MRSA Information    Do not wear jewelry.  Do not wear lotions, powders, deodorant, or perfumes.   Do not shave below the face/neck 48 hours prior to surgery.   Do not bring valuables to the hospital.    [facility] is not responsible for any belongings or valuables.            Contacts, dentures or bridgework may not be worn into surgery.  For patients discharged on the day of surgery, you will NOT be permitted to drive yourself home.   _x___ Take anti-hypertensive listed below, cardiac, seizure, asthma, anti-reflux and psychiatric medicines. These include:  1. Cetirizine/Zyrtec  _x___ Follow recommendations from Cardiologist, Pulmonologist or PCP regarding stopping Aspirin, Coumadin, Plavix ,Eliquis, Effient, or Pradaxa, and Pletal.  _x___ Stop Anti-inflammatories such as Advil, Aleve, Ibuprofen, Motrin, Naproxen, Naprosyn, Goodies powders or aspirin products. OK to take Tylenol and Celebrex.   _x___ NOW: Stop supplements until after surgery.  But may continue Vitamin D, Vitamin B, and multivitamin.

## 2018-02-03 ENCOUNTER — Ambulatory Visit: Payer: Medicare Other | Admitting: Urology

## 2018-02-03 MED ORDER — CEFAZOLIN SODIUM-DEXTROSE 2-4 GM/100ML-% IV SOLN
2.0000 g | INTRAVENOUS | Status: AC
  Administered 2018-02-04: 2 g via INTRAVENOUS
  Filled 2018-02-03: qty 100

## 2018-02-04 ENCOUNTER — Encounter: Payer: Self-pay | Admitting: Anesthesiology

## 2018-02-04 ENCOUNTER — Ambulatory Visit
Admission: RE | Admit: 2018-02-04 | Discharge: 2018-02-04 | Disposition: A | Discharge status: Home / Self Care | Payer: Medicare Other | Source: Ambulatory Visit | Attending: Urology | Admitting: Urology

## 2018-02-04 ENCOUNTER — Ambulatory Visit: Payer: Medicare Other | Admitting: Anesthesiology

## 2018-02-04 ENCOUNTER — Other Ambulatory Visit: Payer: Self-pay

## 2018-02-04 ENCOUNTER — Encounter
Admission: RE | Disposition: A | Discharge status: Home / Self Care | Payer: Self-pay | Source: Ambulatory Visit | Attending: Urology

## 2018-02-04 DIAGNOSIS — C672 Malignant neoplasm of lateral wall of bladder: Secondary | ICD-10-CM | POA: Diagnosis not present

## 2018-02-04 DIAGNOSIS — D494 Neoplasm of unspecified behavior of bladder: Secondary | ICD-10-CM

## 2018-02-04 DIAGNOSIS — Z885 Allergy status to narcotic agent status: Secondary | ICD-10-CM | POA: Diagnosis not present

## 2018-02-04 DIAGNOSIS — C679 Malignant neoplasm of bladder, unspecified: Secondary | ICD-10-CM

## 2018-02-04 DIAGNOSIS — G4730 Sleep apnea, unspecified: Secondary | ICD-10-CM | POA: Diagnosis not present

## 2018-02-04 DIAGNOSIS — Z7982 Long term (current) use of aspirin: Secondary | ICD-10-CM | POA: Diagnosis not present

## 2018-02-04 DIAGNOSIS — Z87891 Personal history of nicotine dependence: Secondary | ICD-10-CM | POA: Insufficient documentation

## 2018-02-04 DIAGNOSIS — R319 Hematuria, unspecified: Secondary | ICD-10-CM | POA: Diagnosis not present

## 2018-02-04 DIAGNOSIS — R310 Gross hematuria: Secondary | ICD-10-CM

## 2018-02-04 DIAGNOSIS — D414 Neoplasm of uncertain behavior of bladder: Secondary | ICD-10-CM

## 2018-02-04 HISTORY — DX: Malignant neoplasm of bladder, unspecified: C67.9

## 2018-02-04 HISTORY — PX: CYSTOSCOPY WITH STENT PLACEMENT: SHX5790

## 2018-02-04 HISTORY — PX: TRANSURETHRAL RESECTION OF BLADDER TUMOR: SHX2575

## 2018-02-04 LAB — URINE DRUG SCREEN, QUALITATIVE (ARMC ONLY)
Amphetamines, Ur Screen: NOT DETECTED
Barbiturates, Ur Screen: NOT DETECTED
CANNABINOID 50 NG, UR ~~LOC~~: NOT DETECTED
Cocaine Metabolite,Ur ~~LOC~~: NOT DETECTED
MDMA (ECSTASY) UR SCREEN: NOT DETECTED
Methadone Scn, Ur: NOT DETECTED
Opiate, Ur Screen: NOT DETECTED
PHENCYCLIDINE (PCP) UR S: NOT DETECTED
Tricyclic, Ur Screen: NOT DETECTED

## 2018-02-04 SURGERY — TURBT (TRANSURETHRAL RESECTION OF BLADDER TUMOR)
Anesthesia: General | Laterality: Right | Wound class: Clean Contaminated

## 2018-02-04 MED ORDER — MIDAZOLAM HCL 2 MG/2ML IJ SOLN
INTRAMUSCULAR | Status: DC | PRN
  Administered 2018-02-04: 2 mg via INTRAVENOUS

## 2018-02-04 MED ORDER — DEXAMETHASONE SODIUM PHOSPHATE 10 MG/ML IJ SOLN
INTRAMUSCULAR | Status: DC | PRN
  Administered 2018-02-04: 5 mg via INTRAVENOUS

## 2018-02-04 MED ORDER — HYDROMORPHONE HCL 1 MG/ML IJ SOLN
INTRAMUSCULAR | Status: AC
  Filled 2018-02-04: qty 1

## 2018-02-04 MED ORDER — HYDROMORPHONE HCL 1 MG/ML IJ SOLN
INTRAMUSCULAR | Status: DC | PRN
  Administered 2018-02-04: .2 mg via INTRAVENOUS

## 2018-02-04 MED ORDER — PROPOFOL 10 MG/ML IV BOLUS
INTRAVENOUS | Status: AC
  Filled 2018-02-04: qty 20

## 2018-02-04 MED ORDER — FENTANYL CITRATE (PF) 100 MCG/2ML IJ SOLN
INTRAMUSCULAR | Status: DC | PRN
  Administered 2018-02-04: 100 ug via INTRAVENOUS

## 2018-02-04 MED ORDER — SUGAMMADEX SODIUM 200 MG/2ML IV SOLN
INTRAVENOUS | Status: DC | PRN
  Administered 2018-02-04: 200 mg via INTRAVENOUS

## 2018-02-04 MED ORDER — MIDAZOLAM HCL 2 MG/2ML IJ SOLN
INTRAMUSCULAR | Status: AC
  Filled 2018-02-04: qty 2

## 2018-02-04 MED ORDER — SUCCINYLCHOLINE CHLORIDE 20 MG/ML IJ SOLN
INTRAMUSCULAR | Status: DC | PRN
  Administered 2018-02-04: 160 mg via INTRAVENOUS

## 2018-02-04 MED ORDER — FENTANYL CITRATE (PF) 100 MCG/2ML IJ SOLN: 25.0000 ug | INTRAMUSCULAR | Status: DC | PRN

## 2018-02-04 MED ORDER — FENTANYL CITRATE (PF) 100 MCG/2ML IJ SOLN
INTRAMUSCULAR | Status: AC
  Filled 2018-02-04: qty 2

## 2018-02-04 MED ORDER — SCOPOLAMINE 1 MG/3DAYS TD PT72
MEDICATED_PATCH | TRANSDERMAL | Status: AC
  Administered 2018-02-04: 1.5 mg
  Filled 2018-02-04: qty 1

## 2018-02-04 MED ORDER — LACTATED RINGERS IV SOLN
INTRAVENOUS | Status: DC
  Administered 2018-02-04: 08:00:00 via INTRAVENOUS

## 2018-02-04 MED ORDER — PROPOFOL 10 MG/ML IV BOLUS
INTRAVENOUS | Status: DC | PRN
  Administered 2018-02-04: 160 mg via INTRAVENOUS

## 2018-02-04 MED ORDER — LIDOCAINE HCL (CARDIAC) PF 100 MG/5ML IV SOSY
PREFILLED_SYRINGE | INTRAVENOUS | Status: DC | PRN
  Administered 2018-02-04: 100 mg via INTRAVENOUS

## 2018-02-04 MED ORDER — FAMOTIDINE 20 MG PO TABS
20.0000 mg | ORAL_TABLET | Freq: Once | ORAL | Status: AC
  Administered 2018-02-04: 20 mg via ORAL

## 2018-02-04 MED ORDER — ROCURONIUM BROMIDE 100 MG/10ML IV SOLN
INTRAVENOUS | Status: DC | PRN
  Administered 2018-02-04: 20 mg via INTRAVENOUS

## 2018-02-04 MED ORDER — ONDANSETRON HCL 4 MG/2ML IJ SOLN: 4.0000 mg | Freq: Once | INTRAMUSCULAR | Status: DC | PRN

## 2018-02-04 MED ORDER — FAMOTIDINE 20 MG PO TABS
ORAL_TABLET | ORAL | Status: AC
  Administered 2018-02-04: 20 mg via ORAL
  Filled 2018-02-04: qty 1

## 2018-02-04 SURGICAL SUPPLY — 33 items
BAG DRAIN CYSTO-URO LG1000N (MISCELLANEOUS) ×4 IMPLANT
BAG URINE DRAINAGE (UROLOGICAL SUPPLIES) ×4 IMPLANT
BRUSH SCRUB EZ  4% CHG (MISCELLANEOUS)
BRUSH SCRUB EZ 4% CHG (MISCELLANEOUS) IMPLANT
CATH FOLEY 2WAY  5CC 16FR (CATHETERS)
CATH URETL 5X70 OPEN END (CATHETERS) ×4 IMPLANT
CATH URTH 16FR FL 2W BLN LF (CATHETERS) IMPLANT
CONRAY 43 FOR UROLOGY 50M (MISCELLANEOUS) IMPLANT
DRAPE UTILITY 15X26 TOWEL STRL (DRAPES) ×4 IMPLANT
DRSG TELFA 4X3 1S NADH ST (GAUZE/BANDAGES/DRESSINGS) ×4 IMPLANT
ELECT LOOP 22F BIPOLAR SML (ELECTROSURGICAL)
ELECT REM PT RETURN 9FT ADLT (ELECTROSURGICAL)
ELECTRODE LOOP 22F BIPOLAR SML (ELECTROSURGICAL) IMPLANT
ELECTRODE REM PT RTRN 9FT ADLT (ELECTROSURGICAL) IMPLANT
GLOVE BIO SURGEON STRL SZ8 (GLOVE) ×4 IMPLANT
GOWN STANDARD XL  REUSABL (MISCELLANEOUS) ×4 IMPLANT
GOWN STRL REUS W/ TWL LRG LVL3 (GOWN DISPOSABLE) ×2 IMPLANT
GOWN STRL REUS W/TWL LRG LVL3 (GOWN DISPOSABLE) ×2
GOWN STRL REUS W/TWL XL LVL4 (GOWN DISPOSABLE) ×4 IMPLANT
KIT TURNOVER CYSTO (KITS) ×4 IMPLANT
LOOP CUT BIPOLAR 24F LRG (ELECTROSURGICAL) ×4 IMPLANT
PACK CYSTO AR (MISCELLANEOUS) ×4 IMPLANT
SENSORWIRE 0.038 NOT ANGLED (WIRE) ×4
SET CYSTO W/LG BORE CLAMP LF (SET/KITS/TRAYS/PACK) ×4 IMPLANT
SET IRRIG Y TYPE TUR BLADDER L (SET/KITS/TRAYS/PACK) ×4 IMPLANT
SOL .9 NS 3000ML IRR  AL (IV SOLUTION) ×4
SOL .9 NS 3000ML IRR UROMATIC (IV SOLUTION) ×4 IMPLANT
STENT URET 6FRX24 CONTOUR (STENTS) IMPLANT
STENT URET 6FRX26 CONTOUR (STENTS) IMPLANT
SURGILUBE 2OZ TUBE FLIPTOP (MISCELLANEOUS) ×4 IMPLANT
SYRINGE IRR TOOMEY STRL 70CC (SYRINGE) ×4 IMPLANT
WATER STERILE IRR 1000ML POUR (IV SOLUTION) ×4 IMPLANT
WIRE SENSOR 0.038 NOT ANGLED (WIRE) ×2 IMPLANT

## 2018-02-04 NOTE — Anesthesia Post-op Follow-up Note (Signed)
Anesthesia QCDR form completed.        

## 2018-02-04 NOTE — OR Nursing (Signed)
Gemcitabine instilled at 0911 by Dr. Bernardo Heater

## 2018-02-04 NOTE — H&P (Addendum)
02/04/2018 7:28 AM   Francis Jones August 01, 1950 [id number]   HPI: 67 year old male recently seen in our office for intermittent gross hematuria.  CT urogram performed on 01/15/2018 was remarkable for an enhancing 2.2 cm polypoid lesion in the right posterior bladder wall.  Cystoscopy performed by Dr. Junious Silk was remarkable for a medium papillary tumor just lateral to the right ureteral orifice.  He presents for TURBT.   PMH: Past Medical History:  Diagnosis Date  . PONV (postoperative nausea and vomiting)     Surgical History: Past Surgical History:  Procedure Laterality Date  . APPENDECTOMY  1973  . FINGER SURGERY Left 2001  . HERNIA REPAIR  2000  . SHOULDER SURGERY Left 2009  . VASECTOMY  1987    Home Medications:   Allergies:  Allergies  Allergen Reactions  . Hydrocodone-Acetaminophen Nausea And Vomiting  . Oxycodone Nausea Only  . Other Nausea Only and Other (See Comments)    Drugs that end in "one"   ALLEGRA ALLERGY 60 MG tablet Generic drug:  fexofenadine Take by mouth.   aspirin 81 MG tablet Take by mouth.   cholecalciferol 1000 units tablet Commonly known as:  VITAMIN D Take by mouth.   Cyanocobalamin 1000 MCG Caps Take by mouth.   loratadine 10 MG tablet Commonly known as:  CLARITIN Take by mouth.   meclizine 25 MG tablet Commonly known as:  ANTIVERT Take 1 tablet (25 mg total) by mouth 3 (three) times daily as needed for dizziness.   MULTIPLE VITAMIN PO Take by mouth.   OMEGA-3 FATTY ACIDS PO Take by mouth.   potassium chloride 10 MEQ CR capsule Commonly known as:  MICRO-K Take by mouth.   Family History: History reviewed. No pertinent family history.  Social History:  reports that he quit smoking about 40 years ago. His smoking use included cigarettes. He has never used smokeless tobacco. He reports that he drinks alcohol. He reports that he has current or past drug history. Drugs: LSD and Opium.  ROS: No significant change  from 01/07/2018  Physical Exam: BP 129/76   Pulse 74   Resp 16   Ht 6' (1.829 m)   Wt 122.2 kg   SpO2 98%   BMI 36.53 kg/m   Constitutional:  Alert and oriented, No acute distress. HEENT: [place] AT, moist mucus membranes.  Trachea midline, no masses. Cardiovascular: No clubbing, cyanosis, or edema.  RRR Respiratory: Normal respiratory effort, no increased work of breathing.  Lungs clear GI: Abdomen is soft, nontender, nondistended, no abdominal masses GU: No CVA tenderness Lymph: No cervical or inguinal lymphadenopathy. Skin: No rashes, bruises or suspicious lesions. Neurologic: Grossly intact, no focal deficits, moving all 4 extremities. Psychiatric: Normal mood and affect.    Pertinent Imaging:  Results for orders placed during the hospital encounter of 01/15/18  CT HEMATURIA WORKUP   Narrative CLINICAL DATA:  Intermittent gross hematuria both remotely and 6 weeks ago  EXAM: CT ABDOMEN AND PELVIS WITHOUT AND WITH CONTRAST  TECHNIQUE: Multidetector CT imaging of the abdomen and pelvis was performed following the standard protocol before and following the bolus administration of intravenous contrast.  CONTRAST:  175mL ISOVUE-300 IOPAMIDOL (ISOVUE-300) INJECTION 61%  COMPARISON:  Report from 05/04/2002  FINDINGS: Lower chest: There is an unusual lesion in the right pleural adipose tissues at the lung base, measuring up to about 27 Hounsfield units without appreciable enhancement, and with in appearance of multifocal lobularity. The entire region measures about 7.8 by 5.3 by 5.2 cm on  the supine images. This includes the adipose tissue separating the individual lobulations. Interestingly, on the prone images, the process seems to migrate anteriorly adjacent to the pericardial adipose tissue as shown on image 1/17. A description of similar findings is noted on prior chest CT reports from 2004.  Calcified granulomas in the left lower lobe. Mild lingular atelectasis or  scarring.  Hepatobiliary: Contracted gallbladder. Mildly prominent caudate lobe without appreciable mass. Otherwise unremarkable.  Pancreas: Unremarkable  Spleen: Unremarkable  Adrenals/Urinary Tract: The adrenal glands appear normal.  Enhancing 2.2 by 1.7 cm polypoid lesion within the right posterior urinary bladder in the vicinity of the right UVJ, with slightly lobulated borders.  No renal parenchymal mass. No additional filling defect along the urothelium. No urinary tract calculi.  Stomach/Bowel: Descending and proximal sigmoid colon diverticulosis. Wall thickening in the lower rectum is probably from nondistention rather than tumor or inflammation, although is technically nonspecific.  Vascular/Lymphatic: Aortoiliac atherosclerotic vascular disease. No pathologic adenopathy identified.  Reproductive: Coarse calcifications centrally in the prostate gland. There is no prostatomegaly, but there is asymmetric dilation enlargement of both seminal vesicles, with the right larger than the left.  Other: No supplemental non-categorized findings.  Musculoskeletal: Degenerative disc disease at L5-S1 with vacuum disc phenomenon and facet arthropathy. These factors contribute to mild bilateral foraminal stenosis at this level.  IMPRESSION: 1. Enhancing 2.2 cm polypoid lesion in the right posterior urinary bladder along the urothelium, favoring transitional cell carcinoma given the history of hematuria. No adenopathy or additional synchronous lesion identified. 2. Bilateral seminal vesicle dilation, right greater than left. Possibilities might include seminal vesicle cysts or ejaculatory duct obstruction. The kidneys appear normal. 3. Unusual lesion in the right pleural adipose tissues at the lung base, with complex nonenhancing multilobular density higher than fluid density making up this lesion. The lesion seems to be mobile, shifting from posterior to a patient-anterior  position on prone imaging. This unusual constellation of features raises the possibility of pleural loose bodies/pleural fibrin bodies, a rare benign condition. Similar findings were described on a chest CT from 15 years ago; those images from 2004 are not available for me to directly compare but similar findings were described in the report. Lack of progression over such a long time period would favor a benign etiology of the pleural lesion. 4.  Aortic Atherosclerosis (ICD10-I70.0). 5. Descending and sigmoid colon diverticulosis. 6. Lumbar impingement at L5-S1. 7. There is wall thickening in the lower rectum which is probably from nondistention rather than tumor or inflammation.   Electronically Signed   By: Van Clines M.D.   On: 01/16/2018 09:04      Assessment & Plan:   67 year old male with a left lateral wall bladder tumor scheduled for TURBT, instillation of post resection intravesical chemotherapy and possible right ureteral stent placement.  I discussed procedure including potential risks of bleeding, infection and bladder injury.  He indicated all questions were answered and desires to proceed.   Abbie Sons, [place] [address] [place], [place] [address] [phone]

## 2018-02-04 NOTE — Discharge Instructions (Signed)

## 2018-02-04 NOTE — Transfer of Care (Signed)
Immediate Anesthesia Transfer of Care Note  Patient: JARMAINE EHRLER  Procedure(s) Performed: TRANSURETHRAL RESECTION OF BLADDER TUMOR (TURBT) with gemcitabine (N/A ) CYSTOSCOPY WITH STENT PLACEMENT (Right )  Patient Location: PACU  Anesthesia Type:General  Level of Consciousness: sedated  Airway & Oxygen Therapy: Patient Spontanous Breathing and Patient connected to face mask oxygen  Post-op Assessment: Report given to RN and Post -op Vital signs reviewed and stable  Post vital signs: Reviewed and stable  Last Vitals:  Vitals Value Taken Time  BP 137/78 02/04/2018  9:35 AM  Temp 36.7 C 02/04/2018  9:35 AM  Pulse 66 02/04/2018  9:46 AM  Resp 14 02/04/2018  9:46 AM  SpO2 99 % 02/04/2018  9:46 AM  Vitals shown include unvalidated device data.  Last Pain:  Vitals:   02/04/18 0634  PainSc: 0-No pain         Complications: No apparent anesthesia complications

## 2018-02-04 NOTE — Anesthesia Preprocedure Evaluation (Signed)
Anesthesia Evaluation  Patient identified by MRN, date of birth, ID band Patient awake    Reviewed: Allergy & Precautions, NPO status , Patient's Chart, lab work & pertinent test results  History of Anesthesia Complications (+) PONV and history of anesthetic complications  Airway Mallampati: II  TM Distance: >3 FB     Dental   Pulmonary sleep apnea , former smoker,    Pulmonary exam normal        Cardiovascular negative cardio ROS Normal cardiovascular exam     Neuro/Psych negative neurological ROS  negative psych ROS   GI/Hepatic negative GI ROS, Neg liver ROS,   Endo/Other  negative endocrine ROS  Renal/GU negative Renal ROS     Musculoskeletal negative musculoskeletal ROS (+)   Abdominal Normal abdominal exam  (+)   Peds negative pediatric ROS (+)  Hematology negative hematology ROS (+)   Anesthesia Other Findings   Reproductive/Obstetrics                             Anesthesia Physical Anesthesia Plan  ASA: II  Anesthesia Plan: General   Post-op Pain Management:    Induction: Intravenous  PONV Risk Score and Plan:   Airway Management Planned: Oral ETT  Additional Equipment:   Intra-op Plan:   Post-operative Plan: Extubation in OR  Informed Consent: I have reviewed the patients History and Physical, chart, labs and discussed the procedure including the risks, benefits and alternatives for the proposed anesthesia with the patient or authorized representative who has indicated his/her understanding and acceptance.     Plan Discussed with: CRNA and Surgeon  Anesthesia Plan Comments:         Anesthesia Quick Evaluation

## 2018-02-04 NOTE — Op Note (Signed)
Preoperative diagnosis: 1. Bladder tumor (>2cm)  Postoperative diagnosis:  1. Bladder tumor (>2cm)  Procedure:  1. Cystoscopy 2. Transurethral resection of bladder tumor (medium) 3. Instillation intravesical gemcitabine  Surgeon: Nicki Reaper C. Stoioff, M.D.  Anesthesia: General  Complications: None  Intraoperative findings:  1. Bladder tumor: 2 to 3 cm papillary bladder tumor right lateral wall lateral to but not involving the ureteral orifice  EBL: Minimal  Specimens: 1. Bladder tumor      Indication: Francis Jones is a patient with intermittent gross hematuria who was found to have a right lateral wall bladder tumor. After reviewing the management options for treatment, he elected to proceed with the above surgical procedure(s). We have discussed the potential benefits and risks of the procedure, side effects of the proposed treatment, the likelihood of the patient achieving the goals of the procedure, and any potential problems that might occur during the procedure or recuperation. Informed consent has been obtained.  Description of procedure:  The patient was taken to the operating room and general anesthesia was induced.  The patient was placed in the dorsal lithotomy position, prepped and draped in the usual sterile fashion, and preoperative antibiotics were administered. A preoperative time-out was performed.   Cystourethroscopy was performed.  The patient's urethra was examined and was normal/ demonstrated mild lateral lobe enlargement with moderate bladder neck elevation.   The bladder was then systematically examined in its entirety.  With findings as described above.  A 26 French continuous-flow resectoscope sheath with visual obturator was lubricated and passed per urethra.  The [facility] resectoscope with a loop was then placed into the sheath.  The bladder was then re-examined after the resectoscope was placed.  The bladder tumor was 2-3 cm. Using loop cautery  resection, the entire tumor was resected and removed for permanent pathologic analysis.  After tumor resection the right ureteral orifice was uninvolved and effluxing clear urine.  A 0.038 Sensor wire was placed on the right ureteral orifice and the wire was not visible and the resected tumor bed.  Hemostasis was then achieved with the loop cautery and the bladder was emptied and reinspected with no further bleeding noted at the end of the procedure.    The bladder was then emptied and the the resectoscope was removed.    A 16 French Foley catheter was placed with return of pink-tinged effluent upon irrigation.  2000 mg of gemcitabine was then instilled through the catheter tubing and a closed system and will remain indwelling for 1 hour.  The patient appeared to tolerate the procedure well and without complications.  After anesthetic reversal he was transported to the PACU in stable condition.   Plan: His catheter will be removed prior to discharge.  Follow-up approximately 2 weeks for pathology review.   Francis C. Bernardo Heater,  MD

## 2018-02-04 NOTE — Interval H&P Note (Signed)
History and Physical Interval Note:  02/04/2018 7:38 AM  Francis Jones  has presented today for surgery, with the diagnosis of hematuria, bladder neoplasm  The various methods of treatment have been discussed with the patient and family. After consideration of risks, benefits and other options for treatment, the patient has consented to  Procedure(s): TRANSURETHRAL RESECTION OF BLADDER TUMOR (TURBT) with gemcitabine (N/A) CYSTOSCOPY WITH STENT PLACEMENT (Right) as a surgical intervention .  The patient's history has been reviewed, patient examined, no change in status, stable for surgery.  I have reviewed the patient's chart and labs.  Questions were answered to the patient's satisfaction.     [place]

## 2018-02-04 NOTE — Anesthesia Procedure Notes (Addendum)
Procedure Name: Intubation Date/Time: 02/04/2018 8:10 AM Performed by: Justus Memory, CRNA Pre-anesthesia Checklist: Patient identified, Patient being monitored, Timeout performed, Emergency Drugs available and Suction available Patient Re-evaluated:Patient Re-evaluated prior to induction Oxygen Delivery Method: Circle system utilized Preoxygenation: Pre-oxygenation with 100% oxygen Induction Type: IV induction Ventilation: Two handed mask ventilation required Laryngoscope Size: Mac and 3 Grade View: Grade I Tube type: Oral Tube size: 7.0 mm Number of attempts: 1 Airway Equipment and Method: Stylet Placement Confirmation: ETT inserted through vocal cords under direct vision,  positive ETCO2 and breath sounds checked- equal and bilateral Secured at: 21 cm Tube secured with: Tape Dental Injury: Teeth and Oropharynx as per pre-operative assessment  Difficulty Due To: Difficulty was anticipated and Difficult Airway- due to large tongue Future Recommendations: Recommend- induction with short-acting agent, and alternative techniques readily available

## 2018-02-05 ENCOUNTER — Telehealth: Payer: Self-pay | Admitting: Urology

## 2018-02-05 LAB — SURGICAL PATHOLOGY

## 2018-02-05 NOTE — Telephone Encounter (Signed)
-----   Message from Abbie Sons, MD sent at 02/05/2018  7:48 AM EDT ----- Needs postop follow-up appointment 2-3 weeks

## 2018-02-05 NOTE — Anesthesia Postprocedure Evaluation (Signed)
Anesthesia Post Note  Patient: Francis Jones  Procedure(s) Performed: TRANSURETHRAL RESECTION OF BLADDER TUMOR (TURBT) with gemcitabine (N/A ) CYSTOSCOPY (Right )  Patient location during evaluation: PACU Anesthesia Type: General Level of consciousness: awake and alert and oriented Pain management: pain level controlled Vital Signs Assessment: post-procedure vital signs reviewed and stable Respiratory status: spontaneous breathing Cardiovascular status: blood pressure returned to baseline Anesthetic complications: no     Last Vitals:  Vitals:   02/04/18 1033 02/04/18 1055  BP: (!) 141/83 (!) 144/80  Pulse: (!) 56   Resp: 16   Temp: (!) 36.2 C   SpO2: 97%     Last Pain:  Vitals:   02/05/18 0856  PainSc: 0-No pain                 [name]

## 2018-02-05 NOTE — Telephone Encounter (Signed)
I made the app and lm for pt to cb to confirm   Francis Jones

## 2018-02-13 ENCOUNTER — Other Ambulatory Visit: Payer: Medicare Other | Admitting: Urology

## 2018-03-04 ENCOUNTER — Encounter: Payer: Self-pay | Admitting: Urology

## 2018-03-04 ENCOUNTER — Ambulatory Visit (INDEPENDENT_AMBULATORY_CARE_PROVIDER_SITE_OTHER): Payer: Medicare Other | Admitting: Urology

## 2018-03-04 VITALS — BP 126/71 | HR 70 | Ht 72.0 in | Wt 276.1 lb

## 2018-03-04 DIAGNOSIS — C679 Malignant neoplasm of bladder, unspecified: Secondary | ICD-10-CM | POA: Diagnosis not present

## 2018-03-04 NOTE — Progress Notes (Signed)
03/04/2018 2:46 PM   Francis Jones 08/07/1950 [id number]  Referring provider: Jerrol Banana., MD [address] [place],  [address]  Chief Complaint  Patient presents with  . Routine Post Op    HPI: 67 year old male presents for postop follow-up.  TURBT was performed on 02/04/2018 for a 2-3 cm papillary tumor of the right lateral wall which did not involve the ureteral orifice.  He had an episode of hematuria 2 weeks postop which resolved and he has had no complaints.  Pathology: The resected tumor was a high-grade papillary urothelial carcinoma.  The path report states muscularis was not present for evaluation however the resection was extended in the muscle at the time of surgery.  No lamina propria invasion is present.   PMH: Past Medical History:  Diagnosis Date  . PONV (postoperative nausea and vomiting)     Surgical History: Past Surgical History:  Procedure Laterality Date  . APPENDECTOMY  1973  . CYSTOSCOPY WITH STENT PLACEMENT Right 02/04/2018   Procedure: CYSTOSCOPY;  Surgeon: Abbie Sons, MD;  Location: [facility] ORS;  Service: Urology;  Laterality: Right;  . FINGER SURGERY Left 2001  . HERNIA REPAIR  2000  . SHOULDER SURGERY Left 2009  . TRANSURETHRAL RESECTION OF BLADDER TUMOR N/A 02/04/2018   Procedure: TRANSURETHRAL RESECTION OF BLADDER TUMOR (TURBT) with gemcitabine;  Surgeon: Abbie Sons, MD;  Location: [facility] ORS;  Service: Urology;  Laterality: N/A;  . VASECTOMY  1987    Home Medications:  Allergies as of 03/04/2018      Reactions   Hydrocodone-acetaminophen Nausea And Vomiting   Oxycodone Nausea Only   Other Nausea Only, Other (See Comments)   Drugs that end in "one"      Medication List        Accurate as of 03/04/18  2:46 PM. Always use your most recent med list.          acetaminophen 500 MG tablet Commonly known as:  TYLENOL Take 1,000 mg by mouth daily as needed for moderate pain.   cetirizine 10 MG  tablet Commonly known as:  ZYRTEC Take 10 mg by mouth daily.   cholecalciferol 1000 units tablet Commonly known as:  VITAMIN D Take 1,000 Units by mouth daily.   FISH OIL PO Take 1,200 mg by mouth daily.   ibuprofen 200 MG tablet Commonly known as:  ADVIL,MOTRIN Take 400 mg by mouth daily as needed (muscle pain).   MULTIPLE VITAMIN PO Take 1 tablet by mouth daily.   SUPER B COMPLEX/C Caps Take 1 capsule by mouth daily.       Allergies:  Allergies  Allergen Reactions  . Hydrocodone-Acetaminophen Nausea And Vomiting  . Oxycodone Nausea Only  . Other Nausea Only and Other (See Comments)    Drugs that end in "one"    Family History: No family history on file.  Social History:  reports that he quit smoking about 40 years ago. His smoking use included cigarettes. He has never used smokeless tobacco. He reports that he drinks alcohol. He reports that he has current or past drug history. Drugs: LSD and Opium.  ROS: UROLOGY Frequent Urination?: No Hard to postpone urination?: No Burning/pain with urination?: No Get up at night to urinate?: No Leakage of urine?: No Urine stream starts and stops?: No Trouble starting stream?: No Do you have to strain to urinate?: No Blood in urine?: No Urinary tract infection?: No Sexually transmitted disease?: No Injury to kidneys or bladder?: No  Painful intercourse?: No Weak stream?: No Erection problems?: No Penile pain?: No  Gastrointestinal Nausea?: No Vomiting?: No Indigestion/heartburn?: No Diarrhea?: No Constipation?: No  Constitutional Fever: No Night sweats?: No Weight loss?: No Fatigue?: No  Skin Skin rash/lesions?: No Itching?: No  Eyes Blurred vision?: No Double vision?: No  Ears/Nose/Throat Sore throat?: No Sinus problems?: No  Hematologic/Lymphatic Swollen glands?: No Easy bruising?: No  Cardiovascular Leg swelling?: No Chest pain?: No  Respiratory Cough?: No Shortness of breath?:  No  Endocrine Excessive thirst?: No  Musculoskeletal Back pain?: No Joint pain?: No  Neurological Headaches?: No Dizziness?: No  Psychologic Depression?: No Anxiety?: No  Physical Exam: BP 126/71 (BP Location: Left Arm, Patient Position: Sitting, Cuff Size: Large)   Pulse 70   Ht 6' (1.829 m)   Wt 276 lb 1.6 oz (125.2 kg)   BMI 37.45 kg/m   Constitutional:  Alert and oriented, No acute distress.   Assessment & Plan:   67 year old male with intermediate risk Ta urothelial carcinoma.  He received a single post resection dose of gemcitabine.  We discussed strategies for prevention of recurrence and progression.  Surveillance cystoscopy versus a 6-week course of intravesical therapy were discussed and he would like to pursue.  Will plan on a 6-week induction of one half strength BCG.  Potential side effects were discussed including irritative voiding symptoms, low-grade fever, malaise and rarely BCG sepsis.  He indicated all questions were answered and desires to proceed.   Abbie Sons, [place] [address] [place], [place] [address] [phone]

## 2018-03-05 ENCOUNTER — Encounter: Payer: Self-pay | Admitting: Urology

## 2018-03-14 ENCOUNTER — Encounter: Payer: Self-pay | Admitting: Urology

## 2018-03-20 ENCOUNTER — Telehealth: Payer: Self-pay

## 2018-03-20 NOTE — Telephone Encounter (Signed)
Left pt mess to call to schedule BCG treatments. Split dose

## 2018-03-28 ENCOUNTER — Ambulatory Visit (INDEPENDENT_AMBULATORY_CARE_PROVIDER_SITE_OTHER): Payer: Medicare Other | Admitting: Family Medicine

## 2018-03-28 DIAGNOSIS — C679 Malignant neoplasm of bladder, unspecified: Secondary | ICD-10-CM | POA: Diagnosis not present

## 2018-03-28 LAB — URINALYSIS, COMPLETE
BILIRUBIN UA: NEGATIVE
GLUCOSE, UA: NEGATIVE
KETONES UA: NEGATIVE
Leukocytes, UA: NEGATIVE
Nitrite, UA: NEGATIVE
PROTEIN UA: NEGATIVE
Specific Gravity, UA: 1.02 (ref 1.005–1.030)
UUROB: 1 mg/dL (ref 0.2–1.0)
pH, UA: 6 (ref 5.0–7.5)

## 2018-03-28 MED ORDER — BCG LIVE 50 MG IS SUSR
0.3000 mL | Freq: Once | INTRAVESICAL | Status: AC
  Administered 2018-03-28: 7.5 mg via INTRAVESICAL

## 2018-03-28 NOTE — Progress Notes (Signed)
BCG Bladder Instillation  BCG # 1  Due to Bladder Cancer patient is present today for a BCG treatment. Patient was cleaned and prepped in a sterile fashion with betadine and lidocaine 2% jelly was instilled into the urethra.  A 14FR catheter was inserted, urine return was noted 1105ml, urine was yellow in color.  9ml of reconstituted BCG was instilled into the bladder. The catheter was then removed. Patient tolerated well, no complications were noted  Preformed by: Elberta Leatherwood, CMA  Follow up/ Additional notes: 1 week

## 2018-04-03 ENCOUNTER — Telehealth: Payer: Self-pay | Admitting: Medical

## 2018-04-03 NOTE — Telephone Encounter (Signed)
Patient here for Flu Vaccine Clinic today. Reviewed  Dr. Bernardo Heater message in [place].  Okay for patient to receive Flu vaccine in late October. Given flu vaccine today.

## 2018-04-04 ENCOUNTER — Ambulatory Visit (INDEPENDENT_AMBULATORY_CARE_PROVIDER_SITE_OTHER): Payer: Medicare Other | Admitting: Family Medicine

## 2018-04-04 DIAGNOSIS — C679 Malignant neoplasm of bladder, unspecified: Secondary | ICD-10-CM | POA: Diagnosis not present

## 2018-04-04 LAB — URINALYSIS, COMPLETE
Bilirubin, UA: NEGATIVE
Glucose, UA: NEGATIVE
Ketones, UA: NEGATIVE
LEUKOCYTES UA: NEGATIVE
Nitrite, UA: NEGATIVE
PH UA: 7 (ref 5.0–7.5)
Protein, UA: NEGATIVE
RBC, UA: NEGATIVE
Specific Gravity, UA: 1.01 (ref 1.005–1.030)
Urobilinogen, Ur: 0.2 mg/dL (ref 0.2–1.0)

## 2018-04-04 LAB — MICROSCOPIC EXAMINATION
BACTERIA UA: NONE SEEN
WBC UA: NONE SEEN /HPF (ref 0–5)

## 2018-04-04 MED ORDER — BCG LIVE 50 MG IS SUSR
0.3000 mL | Freq: Once | INTRAVESICAL | Status: AC
  Administered 2018-04-04: 7.5 mg via INTRAVESICAL

## 2018-04-04 NOTE — Progress Notes (Signed)
BCG Bladder Instillation  BCG # 2  Due to Bladder Cancer patient is present today for a BCG treatment. Patient was cleaned and prepped in a sterile fashion with betadine and lidocaine 2% jelly was instilled into the urethra.  A 14FR catheter was inserted, urine return was noted 280ml, urine was yellow in color.  98ml of reconstituted BCG was instilled into the bladder. The catheter was then removed. Patient tolerated well, no complications were noted  Preformed by: Elberta Leatherwood, CMA, Cristie Hem, CMA  Follow up/ Additional notes: 1 week

## 2018-04-11 ENCOUNTER — Ambulatory Visit: Payer: Medicare Other

## 2018-04-11 DIAGNOSIS — C679 Malignant neoplasm of bladder, unspecified: Secondary | ICD-10-CM

## 2018-04-11 LAB — URINALYSIS, COMPLETE
BILIRUBIN UA: NEGATIVE
GLUCOSE, UA: NEGATIVE
Ketones, UA: NEGATIVE
Leukocytes, UA: NEGATIVE
NITRITE UA: NEGATIVE
Protein, UA: NEGATIVE
RBC UA: NEGATIVE
Specific Gravity, UA: 1.01 (ref 1.005–1.030)
UUROB: 0.2 mg/dL (ref 0.2–1.0)
pH, UA: 6 (ref 5.0–7.5)

## 2018-04-11 LAB — MICROSCOPIC EXAMINATION
Epithelial Cells (non renal): NONE SEEN /hpf (ref 0–10)
RBC, UA: NONE SEEN /hpf (ref 0–2)
WBC UA: NONE SEEN /HPF (ref 0–5)

## 2018-04-17 ENCOUNTER — Ambulatory Visit (INDEPENDENT_AMBULATORY_CARE_PROVIDER_SITE_OTHER): Payer: Medicare Other | Admitting: Family Medicine

## 2018-04-17 DIAGNOSIS — C679 Malignant neoplasm of bladder, unspecified: Secondary | ICD-10-CM

## 2018-04-17 LAB — URINALYSIS, COMPLETE
Bilirubin, UA: NEGATIVE
Glucose, UA: NEGATIVE
KETONES UA: NEGATIVE
Leukocytes, UA: NEGATIVE
Nitrite, UA: NEGATIVE
PH UA: 7.5 (ref 5.0–7.5)
Protein, UA: NEGATIVE
SPEC GRAV UA: 1.02 (ref 1.005–1.030)
Urobilinogen, Ur: 1 mg/dL (ref 0.2–1.0)

## 2018-04-17 LAB — MICROSCOPIC EXAMINATION
EPITHELIAL CELLS (NON RENAL): NONE SEEN /HPF (ref 0–10)
WBC, UA: NONE SEEN /hpf (ref 0–5)

## 2018-04-17 MED ORDER — BCG LIVE 50 MG IS SUSR
0.5000 mL | Freq: Once | INTRAVESICAL | Status: AC
  Administered 2018-04-17: 12.5 mg via INTRAVESICAL

## 2018-04-17 NOTE — Progress Notes (Signed)
BCG Bladder Instillation  BCG # 4  Due to Bladder Cancer patient is present today for a BCG treatment. Patient was cleaned and prepped in a sterile fashion with betadine and lidocaine 2% jelly was instilled into the urethra.  A 14FR catheter was inserted, urine return was noted 51ml, urine was yellow in color.  3ml of reconstituted BCG was instilled into the bladder. The catheter was then removed. Patient tolerated well, no complications were noted  The BCG was split in half with 1 other patient.  Preformed by: Elberta Leatherwood, CMA  Follow up/ Additional notes: 1 week

## 2018-04-18 ENCOUNTER — Ambulatory Visit: Payer: Medicare Other

## 2018-04-25 ENCOUNTER — Ambulatory Visit (INDEPENDENT_AMBULATORY_CARE_PROVIDER_SITE_OTHER): Payer: Medicare Other | Admitting: Family Medicine

## 2018-04-25 DIAGNOSIS — C679 Malignant neoplasm of bladder, unspecified: Secondary | ICD-10-CM

## 2018-04-25 LAB — URINALYSIS, COMPLETE
BILIRUBIN UA: NEGATIVE
GLUCOSE, UA: NEGATIVE
Ketones, UA: NEGATIVE
Leukocytes, UA: NEGATIVE
Nitrite, UA: NEGATIVE
PROTEIN UA: NEGATIVE
SPEC GRAV UA: 1.01 (ref 1.005–1.030)
Urobilinogen, Ur: 0.2 mg/dL (ref 0.2–1.0)
pH, UA: 6 (ref 5.0–7.5)

## 2018-04-25 MED ORDER — BCG LIVE 50 MG IS SUSR
3.2400 mL | Freq: Once | INTRAVESICAL | Status: AC
  Administered 2018-04-28: 81 mg via INTRAVESICAL

## 2018-04-25 NOTE — Progress Notes (Signed)
BCG Bladder Instillation  BCG # 5  Due to Bladder Cancer patient is present today for a BCG treatment. Patient was cleaned and prepped in a sterile fashion with betadine and lidocaine 2% jelly was instilled into the urethra.  A 14FR catheter was inserted, urine return was noted 130ml, urine was yellow in color.  24ml of reconstituted BCG was instilled into the bladder. The catheter was then removed. Patient tolerated well, no complications were noted  1/2 dose given  Preformed by: Elberta Leatherwood, CMA, Dorette Grate, CMA  Follow up/ Additional notes: 1 week

## 2018-04-28 DIAGNOSIS — C679 Malignant neoplasm of bladder, unspecified: Secondary | ICD-10-CM | POA: Diagnosis not present

## 2018-05-02 ENCOUNTER — Ambulatory Visit (INDEPENDENT_AMBULATORY_CARE_PROVIDER_SITE_OTHER): Payer: Medicare Other

## 2018-05-02 DIAGNOSIS — C679 Malignant neoplasm of bladder, unspecified: Secondary | ICD-10-CM | POA: Diagnosis not present

## 2018-05-02 LAB — URINALYSIS, COMPLETE
Bilirubin, UA: NEGATIVE
Glucose, UA: NEGATIVE
Ketones, UA: NEGATIVE
Nitrite, UA: NEGATIVE
PH UA: 6 (ref 5.0–7.5)
Protein, UA: NEGATIVE
Specific Gravity, UA: 1.01 (ref 1.005–1.030)
Urobilinogen, Ur: 0.2 mg/dL (ref 0.2–1.0)

## 2018-05-02 LAB — MICROSCOPIC EXAMINATION
BACTERIA UA: NONE SEEN
EPITHELIAL CELLS (NON RENAL): NONE SEEN /HPF (ref 0–10)

## 2018-05-02 MED ORDER — BCG LIVE 50 MG IS SUSR
0.5000 mL | Freq: Once | INTRAVESICAL | Status: AC
  Administered 2018-05-02: 12.5 mg via INTRAVESICAL

## 2018-05-02 NOTE — Progress Notes (Signed)
BCG Bladder Instillation  BCG # 6  Due to Bladder Cancer patient is present today for a BCG treatment. Patient was cleaned and prepped in a sterile fashion with betadine and lidocaine 2% jelly was instilled into the urethra.  A 14FR catheter was inserted, urine return was noted 74ml, urine was yellow in color.  16ml of reconstituted BCG was instilled into the bladder. The catheter was then removed. Patient tolerated well, no complications were noted  Preformed by: Cristie Hem, CMA and Darrick Grinder, CMA  Follow up/ Additional notes: As Scheduled

## 2018-05-06 ENCOUNTER — Other Ambulatory Visit: Payer: Self-pay | Admitting: Urology

## 2018-05-06 ENCOUNTER — Other Ambulatory Visit: Payer: Medicare Other

## 2018-05-06 ENCOUNTER — Telehealth: Payer: Self-pay | Admitting: Urology

## 2018-05-06 DIAGNOSIS — R310 Gross hematuria: Secondary | ICD-10-CM | POA: Diagnosis not present

## 2018-05-06 DIAGNOSIS — D414 Neoplasm of uncertain behavior of bladder: Secondary | ICD-10-CM | POA: Diagnosis not present

## 2018-05-06 DIAGNOSIS — C679 Malignant neoplasm of bladder, unspecified: Secondary | ICD-10-CM

## 2018-05-06 LAB — URINALYSIS, COMPLETE
Bilirubin, UA: NEGATIVE
GLUCOSE, UA: NEGATIVE
KETONES UA: NEGATIVE
Nitrite, UA: NEGATIVE
Protein, UA: NEGATIVE
SPEC GRAV UA: 1.02 (ref 1.005–1.030)
Urobilinogen, Ur: 0.2 mg/dL (ref 0.2–1.0)
pH, UA: 6.5 (ref 5.0–7.5)

## 2018-05-06 LAB — MICROSCOPIC EXAMINATION: Epithelial Cells (non renal): NONE SEEN /hpf (ref 0–10)

## 2018-05-06 NOTE — Progress Notes (Signed)
error 

## 2018-05-06 NOTE — Telephone Encounter (Signed)
UA and CX were done today, please review. Thanks!

## 2018-05-06 NOTE — Telephone Encounter (Signed)
Would your call Francis Jones and ask if he can drop off a urine for UA and culture?  He is having gross hematuria, urgency and frequency.

## 2018-05-09 LAB — CULTURE, URINE COMPREHENSIVE

## 2018-05-13 ENCOUNTER — Other Ambulatory Visit: Payer: Self-pay | Admitting: Urology

## 2018-05-13 ENCOUNTER — Ambulatory Visit
Admission: RE | Admit: 2018-05-13 | Discharge: 2018-05-13 | Disposition: A | Discharge status: Home / Self Care | Payer: Medicare Other | Source: Ambulatory Visit | Attending: Urology | Admitting: Urology

## 2018-05-13 ENCOUNTER — Other Ambulatory Visit: Payer: Self-pay

## 2018-05-13 DIAGNOSIS — K5649 Other impaction of intestine: Secondary | ICD-10-CM | POA: Diagnosis not present

## 2018-05-13 DIAGNOSIS — R309 Painful micturition, unspecified: Secondary | ICD-10-CM | POA: Diagnosis not present

## 2018-05-13 DIAGNOSIS — R109 Unspecified abdominal pain: Secondary | ICD-10-CM | POA: Diagnosis not present

## 2018-05-13 DIAGNOSIS — C679 Malignant neoplasm of bladder, unspecified: Secondary | ICD-10-CM

## 2018-05-13 NOTE — Progress Notes (Signed)
KUB order is in .  

## 2018-05-14 ENCOUNTER — Ambulatory Visit: Payer: Medicare Other

## 2018-05-14 ENCOUNTER — Other Ambulatory Visit: Payer: Self-pay

## 2018-05-14 VITALS — BP 132/86 | HR 67 | Wt 272.0 lb

## 2018-05-14 DIAGNOSIS — C679 Malignant neoplasm of bladder, unspecified: Secondary | ICD-10-CM

## 2018-05-14 LAB — BLADDER SCAN AMB NON-IMAGING: Scan Result: 77

## 2018-05-14 NOTE — Progress Notes (Signed)
Patient seen today per Zara Council.  Bladder scan done resulting at 56ml.

## 2018-05-18 ENCOUNTER — Telehealth: Payer: Self-pay | Admitting: Urology

## 2018-05-18 NOTE — Telephone Encounter (Signed)
Spoke with Francis Jones and he states he is improving.  He is now having regular BM's.  He has stopped the Myrbetriq as he was starting to have difficultly with urination.  He is still having the urgency to urinate.  Patient denies any gross hematuria, dysuria or suprapubic/flank pain.  Patient denies any fevers, chills, nausea or vomiting.

## 2018-05-22 DIAGNOSIS — D2272 Melanocytic nevi of left lower limb, including hip: Secondary | ICD-10-CM | POA: Diagnosis not present

## 2018-05-22 DIAGNOSIS — D225 Melanocytic nevi of trunk: Secondary | ICD-10-CM | POA: Diagnosis not present

## 2018-05-22 DIAGNOSIS — D2261 Melanocytic nevi of right upper limb, including shoulder: Secondary | ICD-10-CM | POA: Diagnosis not present

## 2018-05-22 DIAGNOSIS — X32XXXA Exposure to sunlight, initial encounter: Secondary | ICD-10-CM | POA: Diagnosis not present

## 2018-05-22 DIAGNOSIS — L538 Other specified erythematous conditions: Secondary | ICD-10-CM | POA: Diagnosis not present

## 2018-05-22 DIAGNOSIS — D2271 Melanocytic nevi of right lower limb, including hip: Secondary | ICD-10-CM | POA: Diagnosis not present

## 2018-05-22 DIAGNOSIS — L570 Actinic keratosis: Secondary | ICD-10-CM | POA: Diagnosis not present

## 2018-05-22 DIAGNOSIS — L821 Other seborrheic keratosis: Secondary | ICD-10-CM | POA: Diagnosis not present

## 2018-05-22 DIAGNOSIS — L820 Inflamed seborrheic keratosis: Secondary | ICD-10-CM | POA: Diagnosis not present

## 2018-05-22 DIAGNOSIS — D2262 Melanocytic nevi of left upper limb, including shoulder: Secondary | ICD-10-CM | POA: Diagnosis not present

## 2018-07-18 ENCOUNTER — Other Ambulatory Visit: Payer: Medicare Other | Admitting: Urology

## 2018-07-29 ENCOUNTER — Other Ambulatory Visit: Payer: Medicare Other | Admitting: Urology

## 2018-08-01 ENCOUNTER — Other Ambulatory Visit: Payer: Medicare Other | Admitting: Urology

## 2018-08-15 ENCOUNTER — Encounter: Payer: Self-pay | Admitting: Urology

## 2018-08-15 ENCOUNTER — Ambulatory Visit (INDEPENDENT_AMBULATORY_CARE_PROVIDER_SITE_OTHER): Payer: Medicare Other | Admitting: Urology

## 2018-08-15 VITALS — BP 144/87 | HR 80 | Ht 72.0 in | Wt 276.0 lb

## 2018-08-15 DIAGNOSIS — C679 Malignant neoplasm of bladder, unspecified: Secondary | ICD-10-CM | POA: Diagnosis not present

## 2018-08-15 LAB — URINALYSIS, COMPLETE
Bilirubin, UA: NEGATIVE
Glucose, UA: NEGATIVE
Ketones, UA: NEGATIVE
Nitrite, UA: NEGATIVE
Protein, UA: NEGATIVE
Specific Gravity, UA: 1.02 (ref 1.005–1.030)
UUROB: 2 mg/dL — AB (ref 0.2–1.0)
pH, UA: 7 (ref 5.0–7.5)

## 2018-08-15 LAB — MICROSCOPIC EXAMINATION: EPITHELIAL CELLS (NON RENAL): NONE SEEN /HPF (ref 0–10)

## 2018-08-15 MED ORDER — LIDOCAINE HCL URETHRAL/MUCOSAL 2 % EX GEL
1.0000 "application " | Freq: Once | CUTANEOUS | Status: AC
  Administered 2018-08-15: 1 via URETHRAL

## 2018-08-15 NOTE — Patient Instructions (Signed)
Bladder Cancer  Bladder cancer is an abnormal growth of tissue in the bladder. The bladder is the balloon-like sac in the pelvis. It collects and stores urine that comes from the kidneys through the ureters. The bladder wall is made of layers. If cancer spreads into these layers and through the wall of the bladder, it becomes more difficult to treat. What are the causes? The cause of this condition is not known. What increases the risk? The following factors may make you more likely to develop this condition:  Smoking.  Workplace risks (occupational exposures), such as rubber, leather, textile, dyes, chemicals, and paint.  Being white.  Your age. Most people with bladder cancer are over the age of 55.  Being male.  Having chronic bladder inflammation.  Having a personal history of bladder cancer.  Having a family history of bladder cancer (heredity).  Having had chemotherapy or radiation therapy to the pelvis.  Having been exposed to arsenic. What are the signs or symptoms? Initial symptoms of this condition include:  Blood in the urine.  Painful urination.  Frequent bladder or urine infections.  Increase in urgency and frequency of urination. Advanced symptoms of this condition include:  Not being able to urinate.  Low back pain on one side.  Loss of appetite.  Weight loss.  Fatigue.  Swelling in the feet.  Bone pain. How is this diagnosed? This condition is diagnosed based on your medical history, a physical exam, urine tests, lab tests, imaging tests, and your symptoms. You may also have other tests or procedures done, such as:  A narrow tube being inserted into your bladder through your urethra (cystoscopy) in order to view the lining of your bladder for tumors.  A biopsy to sample the tumor to see if cancer is present. If cancer is present, it will then be staged to determine its severity and extent. Staging is an assessment of:  The size of the  tumor.  Whether the cancer has spread.  Where the cancer has spread. It is important to know how deeply into the bladder wall cancer has grown and whether cancer has spread to any other parts of your body. Staging may require blood tests or imaging tests, such as a CT scan, MRI, bone scan, or chest X-ray. How is this treated? Based on the stage of cancer, one treatment or a combination of treatments may be recommended. The most common forms of treatment are:  Surgery to remove the cancer. Procedures that may be done include transurethral resection and cystectomy.  Radiation therapy. This is high-energy X-rays or other particles. This is often used in combination with chemotherapy.  Chemotherapy. During this treatment, medicines are used to kill cancer cells.  Immunotherapy. This uses medicines to help your own immune system destroy cancer cells. Follow these instructions at home:  Take over-the-counter and prescription medicines only as told by your health care provider.  Maintain a healthy diet. Some of your treatments might affect your appetite.  Consider joining a support group. This may help you learn to cope with the stress of having bladder cancer.  Tell your cancer care team if you develop side effects. They may be able to recommend ways to relieve them.  Keep all follow-up visits as told by your health care provider. This is important. Where to find more information  American Cancer Society: www.cancer.org  National Cancer Institute (NCI): www.cancer.gov Contact a health care provider if:  You have symptoms of a urinary tract infection. These include: ?   Fever. ? Chills. ? Weakness. ? Muscle aches. ? Abdominal pain. ? Frequent and intense urge to urinate. ? Burning feeling in the bladder or urethra during urination. Get help right away if:  There is blood in your urine.  You cannot urinate.  You have severe pain or other symptoms that do not go  away. Summary  Bladder cancer is an abnormal growth of tissue in the bladder.  This condition is diagnosed based on your medical history, a physical exam, urine tests, lab tests, imaging tests, and your symptoms.  Based on the stage of cancer, surgery, chemotherapy, or a combination of treatments may be recommended.  Consider joining a support group. This may help you learn to cope with the stress of having bladder cancer. This information is not intended to replace advice given to you by your health care provider. Make sure you discuss any questions you have with your health care provider. Document Released: 05/31/2003 Document Revised: 05/01/2016 Document Reviewed: 05/01/2016 Elsevier Interactive Patient Education  2019 Elsevier Inc.  

## 2018-08-15 NOTE — Progress Notes (Signed)
   08/15/18  CC:  Chief Complaint  Patient presents with  . Cysto   Urologic history:  -TURBT August 2019 papillary tumor right lateral wall  - High-grade urothelial carcinoma Ta  - Induction BCG completed November 3053  HPI: 68 year old male presents for surveillance cystoscopy  Blood pressure (!) 144/87, pulse 80, height 6' (1.829 m), weight 276 lb (125.2 kg). NED. A&Ox3.   No respiratory distress   Abd soft, NT, ND Normal phallus with bilateral descended testicles  Cystoscopy Procedure Note  Patient identification was confirmed, informed consent was obtained, and patient was prepped using Betadine solution.  Lidocaine jelly was administered per urethral meatus.     Pre-Procedure: - Inspection reveals a normal caliber urethral meatus.  Procedure: The flexible cystoscope was introduced without difficulty - No urethral strictures/lesions are present. - Mild lateral lobe enlargement prostate - Mild elevation bladder neck - Bilateral ureteral orifices identified - Bladder mucosa  reveals no ulcers, tumors, or lesions - No bladder stones - No trabeculation  Retroflexion shows no significant abnormalities   Post-Procedure: - Patient tolerated the procedure well  Assessment/ Plan: No evidence of recurrent urothelial carcinoma.  Discussed maintenance BCG and he would like to think over.  Return in about 3 months (around 11/15/2018) for Cystoscopy.   Francis Sons, MD

## 2018-12-04 ENCOUNTER — Other Ambulatory Visit: Payer: Medicare Other | Admitting: Urology

## 2019-01-01 ENCOUNTER — Encounter: Payer: Self-pay | Admitting: Urology

## 2019-01-01 ENCOUNTER — Ambulatory Visit (INDEPENDENT_AMBULATORY_CARE_PROVIDER_SITE_OTHER): Payer: Medicare Other | Admitting: Urology

## 2019-01-01 ENCOUNTER — Other Ambulatory Visit: Payer: Self-pay

## 2019-01-01 VITALS — BP 117/78 | HR 74 | Ht 72.0 in | Wt 276.0 lb

## 2019-01-01 DIAGNOSIS — C679 Malignant neoplasm of bladder, unspecified: Secondary | ICD-10-CM

## 2019-01-01 LAB — URINALYSIS, COMPLETE
Bilirubin, UA: NEGATIVE
Glucose, UA: NEGATIVE
Ketones, UA: NEGATIVE
Leukocytes,UA: NEGATIVE
Nitrite, UA: NEGATIVE
Protein,UA: NEGATIVE
RBC, UA: NEGATIVE
Specific Gravity, UA: 1.02 (ref 1.005–1.030)
Urobilinogen, Ur: 0.2 mg/dL (ref 0.2–1.0)
pH, UA: 6 (ref 5.0–7.5)

## 2019-01-01 LAB — MICROSCOPIC EXAMINATION
Bacteria, UA: NONE SEEN
Epithelial Cells (non renal): NONE SEEN /hpf (ref 0–10)
WBC, UA: NONE SEEN /hpf (ref 0–5)

## 2019-01-01 MED ORDER — LIDOCAINE HCL URETHRAL/MUCOSAL 2 % EX GEL
1.0000 "application " | Freq: Once | CUTANEOUS | Status: AC
  Administered 2019-01-01: 1 via URETHRAL

## 2019-01-01 NOTE — Progress Notes (Signed)
   01/01/19  CC:  Chief Complaint  Patient presents with  . Cysto    Urologic history:             -TURBT August 2019 papillary bladder tumor             - High-grade urothelial carcinoma Ta             - Induction BCG completed November 2019  HPI: [name] presents today for surveillance cystoscopy.  Denies gross hematuria.  Blood pressure 117/78, pulse 74, height 6' (1.829 m), weight 276 lb (125.2 kg). NED. A&Ox3.   No respiratory distress   Abd soft, NT, ND Normal phallus with bilateral descended testicles  Cystoscopy Procedure Note  Patient identification was confirmed, informed consent was obtained, and patient was prepped using Betadine solution.  Lidocaine jelly was administered per urethral meatus.     Pre-Procedure: - Inspection reveals a normal caliber urethral meatus.  Procedure: The flexible cystoscope was introduced without difficulty - No urethral strictures/lesions are present. - Mild lateral lobe enlargement prostate - Mild elevation bladder neck - Bilateral ureteral orifices identified - Bladder mucosa  reveals no ulcers, tumors, or lesions - No bladder stones - No trabeculation  Retroflexion shows no significant abnormalities   Post-Procedure: - Patient tolerated the procedure well  Assessment/ Plan: No evidence of recurrent bladder tumor.  Urine was sent for cytology.  Follow-up surveillance cystoscopy in 3 months.   Abbie Sons, MD

## 2019-01-01 NOTE — Patient Instructions (Signed)
Bladder Cancer  Bladder cancer is an abnormal growth of tissue in the bladder. The bladder is the balloon-like sac in the pelvis. It collects and stores urine that comes from the kidneys through the ureters. The bladder wall is made of layers. If cancer spreads into these layers and through the wall of the bladder, it becomes more difficult to treat. What are the causes? The cause of this condition is not known. What increases the risk? The following factors may make you more likely to develop this condition:  Smoking.  Workplace risks (occupational exposures), such as rubber, leather, textile, dyes, chemicals, and paint.  Being white.  Your age. Most people with bladder cancer are over the age of 4.  Being male.  Having chronic bladder inflammation.  Having a personal history of bladder cancer.  Having a family history of bladder cancer (heredity).  Having had chemotherapy or radiation therapy to the pelvis.  Having been exposed to arsenic. What are the signs or symptoms? Initial symptoms of this condition include:  Blood in the urine.  Painful urination.  Frequent bladder or urine infections.  Increase in urgency and frequency of urination. Advanced symptoms of this condition include:  Not being able to urinate.  Low back pain on one side.  Loss of appetite.  Weight loss.  Fatigue.  Swelling in the feet.  Bone pain. How is this diagnosed? This condition is diagnosed based on your medical history, a physical exam, urine tests, lab tests, imaging tests, and your symptoms. You may also have other tests or procedures done, such as:  A narrow tube being inserted into your bladder through your urethra (cystoscopy) in order to view the lining of your bladder for tumors.  A biopsy to sample the tumor to see if cancer is present. If cancer is present, it will then be staged to determine its severity and extent. Staging is an assessment of:  The size of the tumor.   Whether the cancer has spread.  Where the cancer has spread. It is important to know how deeply into the bladder wall cancer has grown and whether cancer has spread to any other parts of your body. Staging may require blood tests or imaging tests, such as a CT scan, MRI, bone scan, or chest X-ray. How is this treated? Based on the stage of cancer, one treatment or a combination of treatments may be recommended. The most common forms of treatment are:  Surgery to remove the cancer. Procedures that may be done include transurethral resection and cystectomy.  Radiation therapy. This is high-energy X-rays or other particles. This is often used in combination with chemotherapy.  Chemotherapy. During this treatment, medicines are used to kill cancer cells.  Immunotherapy. This uses medicines to help your own immune system destroy cancer cells. Follow these instructions at home:  Take over-the-counter and prescription medicines only as told by your health care provider.  Maintain a healthy diet. Some of your treatments might affect your appetite.  Consider joining a support group. This may help you learn to cope with the stress of having bladder cancer.  Tell your cancer care team if you develop side effects. They may be able to recommend ways to relieve them.  Keep all follow-up visits as told by your health care provider. This is important. Where to find more information  American Cancer Society: www.cancer.[place] www.cancer.gov Contact a health care provider if:  You have symptoms of a urinary tract infection. These include: ?  Fever. ? Chills. ? Weakness. ? Muscle aches. ? Abdominal pain. ? Frequent and intense urge to urinate. ? Burning feeling in the bladder or urethra during urination. Get help right away if:  There is blood in your urine.  You cannot urinate.  You have severe pain or other symptoms that do not go  away. Summary  Bladder cancer is an abnormal growth of tissue in the bladder.  This condition is diagnosed based on your medical history, a physical exam, urine tests, lab tests, imaging tests, and your symptoms.  Based on the stage of cancer, surgery, chemotherapy, or a combination of treatments may be recommended.  Consider joining a support group. This may help you learn to cope with the stress of having bladder cancer. This information is not intended to replace advice given to you by your health care provider. Make sure you discuss any questions you have with your health care provider. Document Released: 05/31/2003 Document Revised: 05/10/2017 Document Reviewed: 05/01/2016 Elsevier Patient Education  2020 Elsevier Inc.  

## 2019-02-08 IMAGING — CT CT ABD-PEL WO/W CM
3 of 12 series · 11 of 46 positions shown, 17 images · IV contrast (iopamidol)
Comparison: Report from 05/04/2002

CLINICAL DATA: Intermittent gross hematuria both remotely and 6
weeks ago

EXAM:
CT ABDOMEN AND PELVIS WITHOUT AND WITH CONTRAST
TECHNIQUE: Multidetector CT imaging of the abdomen and pelvis was performed
following the standard protocol before and following the bolus
administration of intravenous contrast.
CONTRAST:  125mL 3GCZQS-YUU IOPAMIDOL (3GCZQS-YUU) INJECTION 61%

[Series 2: without pre · axial · non-contrast · 0.98mm/px · z∈[-1656,-1266]mm · 7 of 106 slices shown, 12 images]
[im 14/106  soft-tissue]
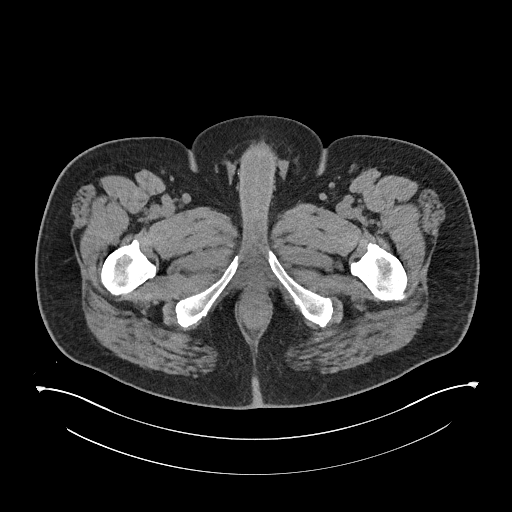
[im 14/106  bone]
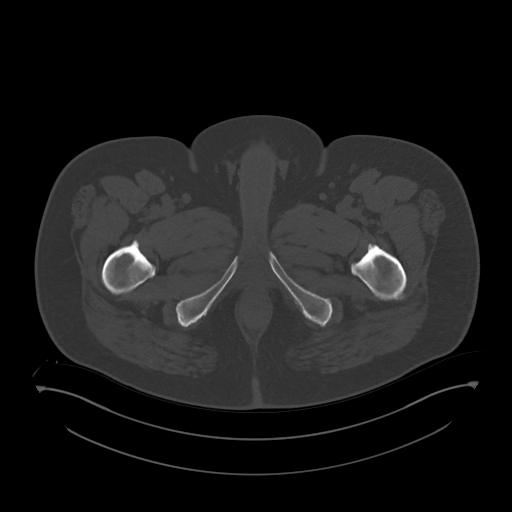
[im 27/106  soft-tissue]
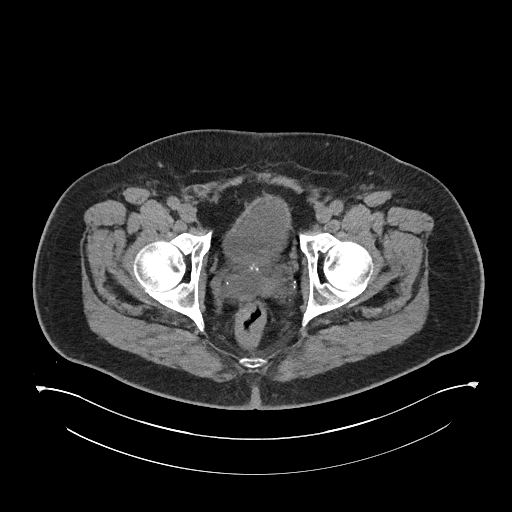
[im 40/106  soft-tissue]
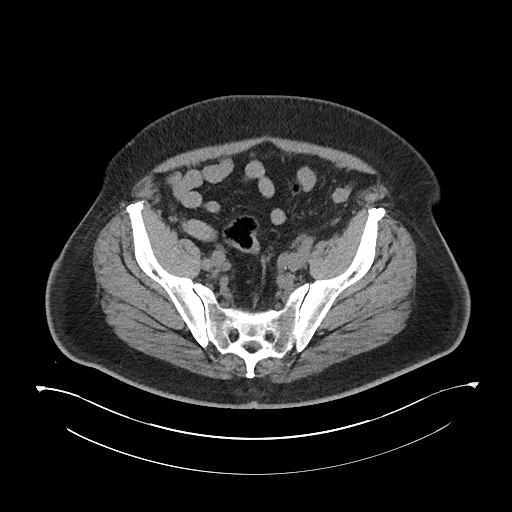
[im 53/106  soft-tissue]
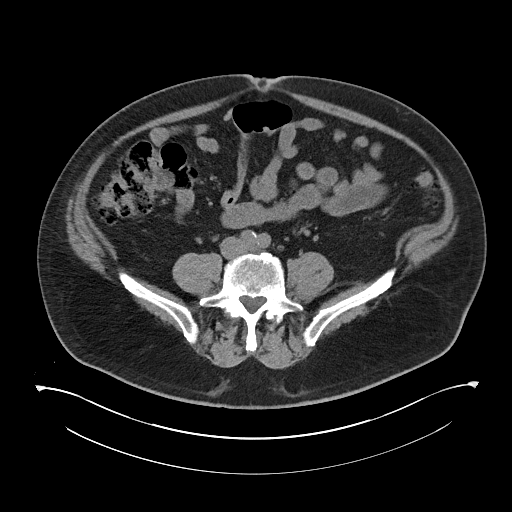
[im 53/106  lung]
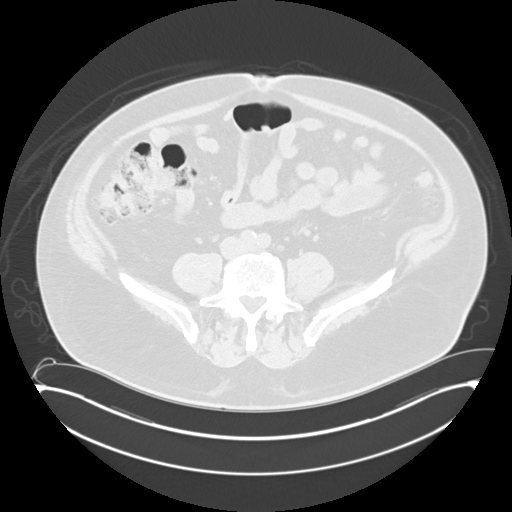
[im 66/106  soft-tissue]
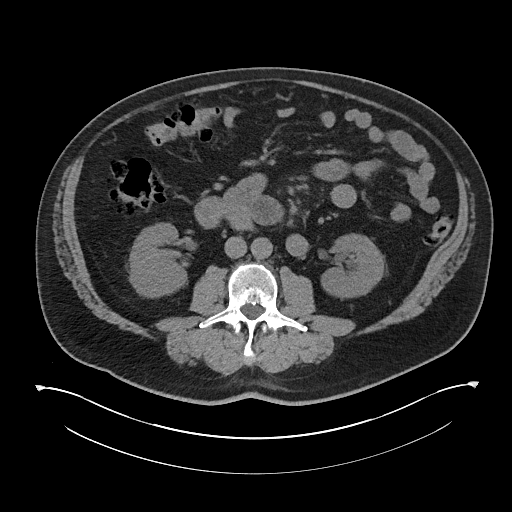
[im 66/106  lung]
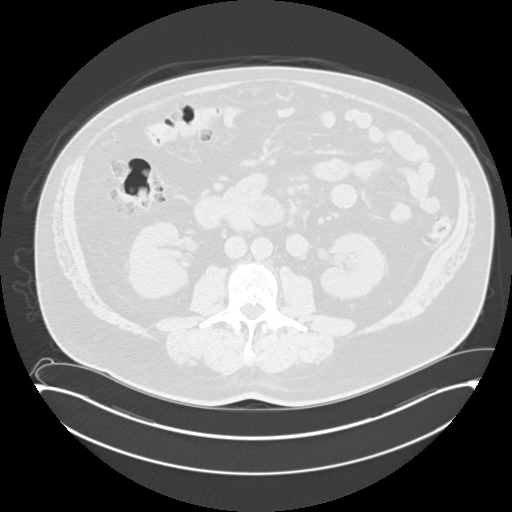
[im 79/106  soft-tissue]
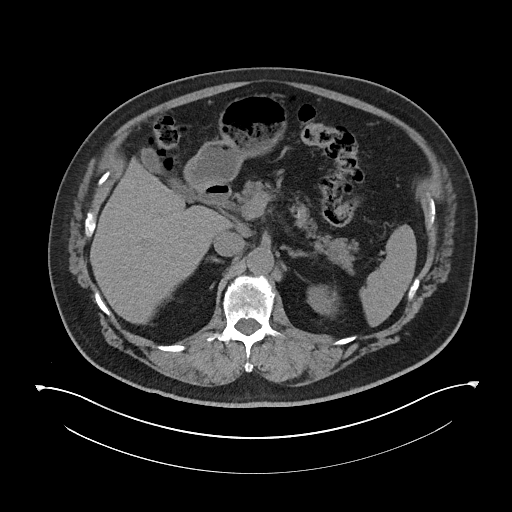
[im 79/106  lung]
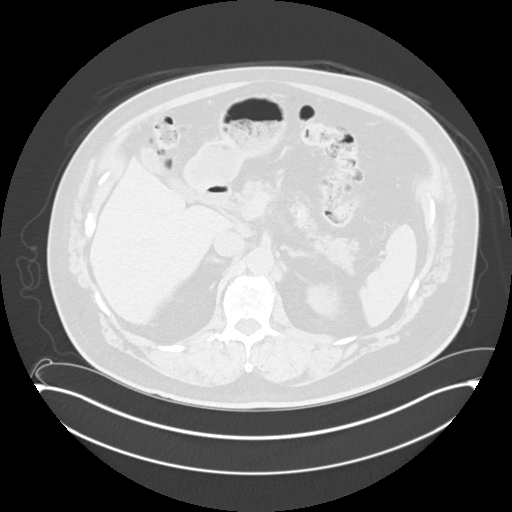
[im 92/106  soft-tissue]
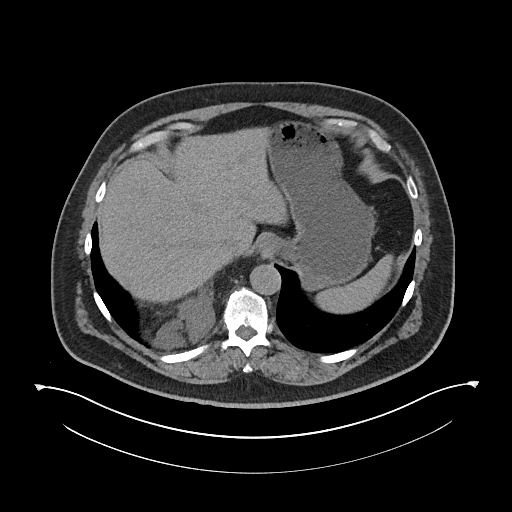
[im 92/106  lung]
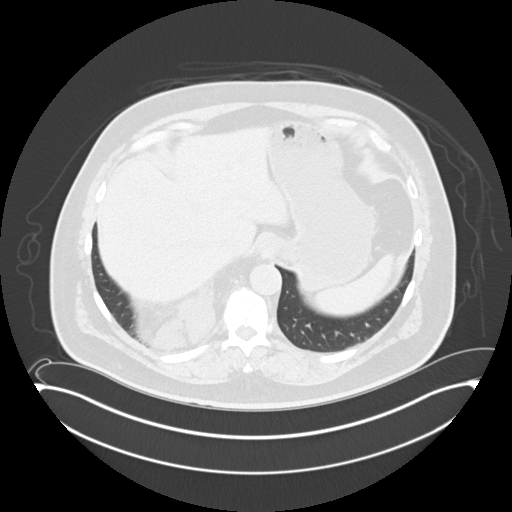

[Series 5: cor without without pre · coronal · non-contrast · 0.88mm/px · 2 of 176 slices shown, 3 images]
[im 59/176  soft-tissue]
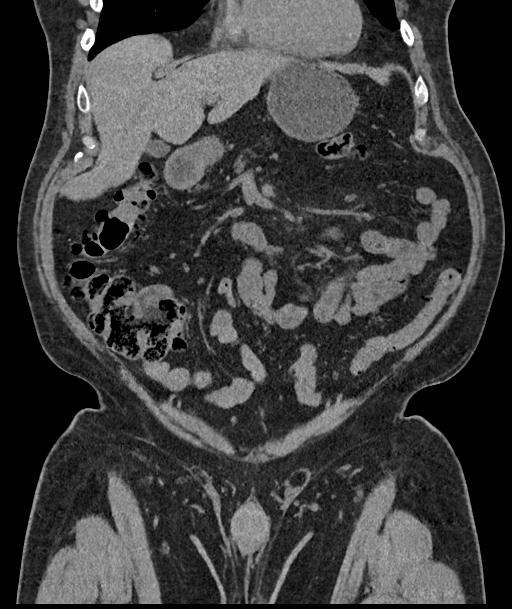
[im 59/176  bone]
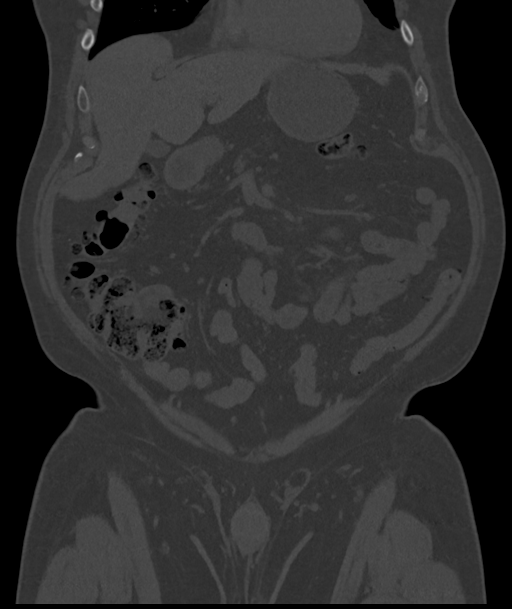
[im 117/176  soft-tissue]
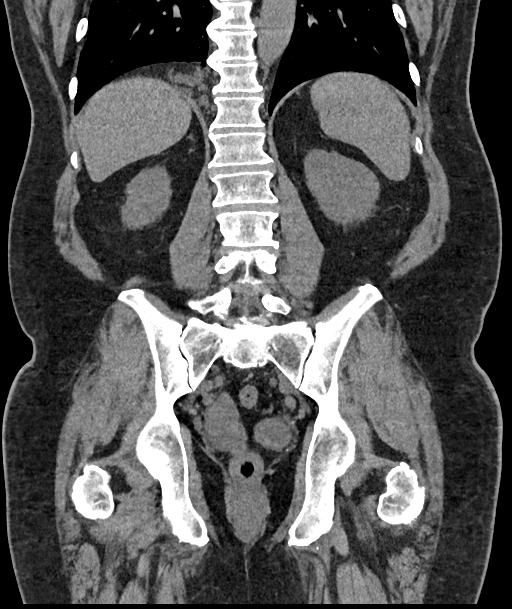

[Series 9: axial with hematuria with · axial · 0.98mm/px · z∈[-1646,-1571]mm · 2 of 106 slices shown]
[im 16/106  soft-tissue]
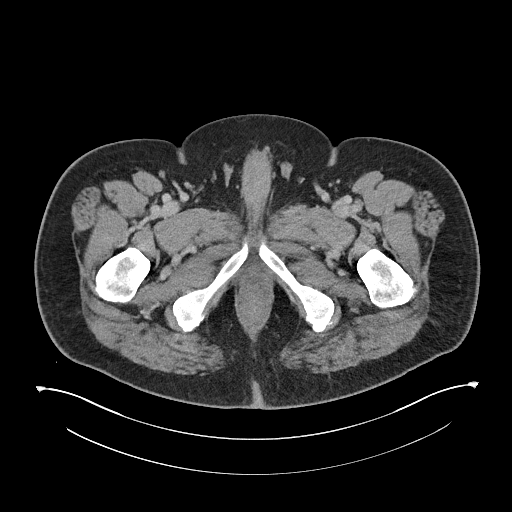
[im 31/106  soft-tissue]
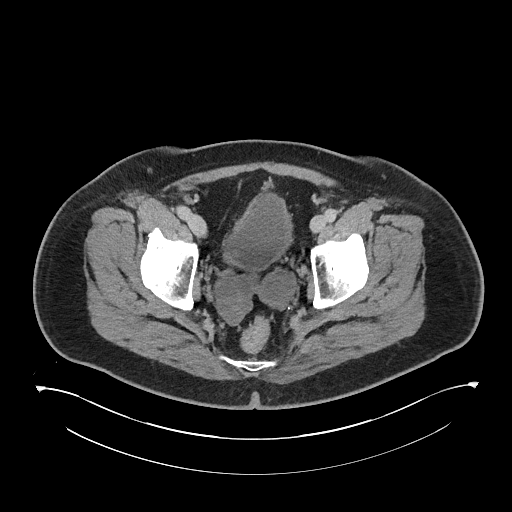

[11 of 46 positions shown; findings below may reference images not displayed]

FINDINGS: Lower chest: There is an unusual lesion in the right pleural adipose
tissues at the lung base, measuring up to about 27 Hounsfield units
without appreciable enhancement, and with in appearance of
multifocal lobularity. The entire region measures about 7.8 by
by 5.2 cm on the supine images. This includes the adipose tissue
separating the individual lobulations. Interestingly, on the prone
images, the process seems to migrate anteriorly adjacent to the
pericardial adipose tissue as shown on image [DATE]. A description of
similar findings is noted on prior chest CT reports from 5229.

Calcified granulomas in the left lower lobe. Mild lingular
atelectasis or scarring.

Hepatobiliary: Contracted gallbladder. Mildly prominent caudate lobe
without appreciable mass. Otherwise unremarkable.

Pancreas: Unremarkable

Spleen: Unremarkable

Adrenals/Urinary Tract: The adrenal glands appear normal.

Enhancing 2.2 by 1.7 cm polypoid lesion within the right posterior
urinary bladder in the vicinity of the right UVJ, with slightly
lobulated borders.

No renal parenchymal mass. No additional filling defect along the
urothelium. No urinary tract calculi.

Stomach/Bowel: Descending and proximal sigmoid colon diverticulosis.
Wall thickening in the lower rectum is probably from nondistention
rather than tumor or inflammation, although is technically
nonspecific.

Vascular/Lymphatic: Aortoiliac atherosclerotic vascular disease. No
pathologic adenopathy identified.

Reproductive: Coarse calcifications centrally in the prostate gland.
There is no prostatomegaly, but there is asymmetric dilation
enlargement of both seminal vesicles, with the right larger than the
left.

Other: No supplemental non-categorized findings.

Musculoskeletal: Degenerative disc disease at L5-S1 with vacuum disc
phenomenon and facet arthropathy. These factors contribute to mild
bilateral foraminal stenosis at this level.
IMPRESSION: 1. Enhancing 2.2 cm polypoid lesion in the right posterior urinary
bladder along the urothelium, favoring transitional cell carcinoma
given the history of hematuria. No adenopathy or additional
synchronous lesion identified.
2. Bilateral seminal vesicle dilation, right greater than left.
Possibilities might include seminal vesicle cysts or ejaculatory
duct obstruction. The kidneys appear normal.
3. Unusual lesion in the right pleural adipose tissues at the lung
base, with complex nonenhancing multilobular density higher than
fluid density making up this lesion. The lesion seems to be mobile,
shifting from posterior to a patient-anterior position on prone
imaging. This unusual constellation of features raises the
possibility of pleural loose bodies/pleural fibrin bodies, a rare
benign condition. Similar findings were described on a chest CT from
directly compare but similar findings were described in the report.
Lack of progression over such a long time period would favor a
benign etiology of the pleural lesion.
4.  Aortic Atherosclerosis ([id number]).
5. Descending and sigmoid colon diverticulosis.
6. Lumbar impingement at L5-S1.
7. There is wall thickening in the lower rectum which is probably
from nondistention rather than tumor or inflammation.

## 2019-03-25 ENCOUNTER — Other Ambulatory Visit: Payer: Self-pay

## 2019-03-25 ENCOUNTER — Ambulatory Visit: Payer: Self-pay

## 2019-03-25 DIAGNOSIS — Z23 Encounter for immunization: Secondary | ICD-10-CM

## 2019-04-03 ENCOUNTER — Other Ambulatory Visit: Payer: Self-pay

## 2019-04-03 ENCOUNTER — Ambulatory Visit (INDEPENDENT_AMBULATORY_CARE_PROVIDER_SITE_OTHER): Payer: Medicare Other | Admitting: Urology

## 2019-04-03 ENCOUNTER — Encounter: Payer: Self-pay | Admitting: Urology

## 2019-04-03 VITALS — BP 164/90 | HR 63 | Ht 72.0 in | Wt 276.0 lb

## 2019-04-03 DIAGNOSIS — C679 Malignant neoplasm of bladder, unspecified: Secondary | ICD-10-CM | POA: Diagnosis not present

## 2019-04-03 LAB — MICROSCOPIC EXAMINATION: Bacteria, UA: NONE SEEN

## 2019-04-03 LAB — URINALYSIS, COMPLETE
Bilirubin, UA: NEGATIVE
Glucose, UA: NEGATIVE
Ketones, UA: NEGATIVE
Leukocytes,UA: NEGATIVE
Nitrite, UA: NEGATIVE
Protein,UA: NEGATIVE
Specific Gravity, UA: >1.03 — ABNORMAL HIGH (ref 1.005–1.030)
Urobilinogen, Ur: 0.2 mg/dL (ref 0.2–1.0)
pH, UA: 5 (ref 5.0–7.5)

## 2019-04-03 MED ORDER — LIDOCAINE HCL URETHRAL/MUCOSAL 2 % EX GEL
1.0000 "application " | Freq: Once | CUTANEOUS | Status: AC
  Administered 2019-04-03: 1 via URETHRAL

## 2019-04-03 NOTE — Progress Notes (Signed)
   04/03/19  CC:  Chief Complaint  Patient presents with  . Cysto    Indications: -TURBT August 2019 papillary bladder tumor -High-grade urothelial carcinomaTa - Induction BCG completed November 2019  HPI: 68 y.o. male presents for quarterly surveillance cystoscopy.  He has no complaints  Blood pressure (!) 164/90, pulse 63, height 6' (1.829 m), weight 276 lb (125.2 kg). NED. A&Ox3.   No respiratory distress    Cystoscopy Procedure Note  Patient identification was confirmed, informed consent was obtained, and patient was prepped using Betadine solution.  Lidocaine jelly was administered per urethral meatus.    Pre-Procedure: - Inspection reveals a normal caliber urethral meatus.  Procedure: The flexible cystoscope was introduced without difficulty - No urethral strictures/lesions are present. -Mild lateral lobe enlargementprostate -Mild elevationbladder neck - Bilateral ureteral orifices identified - Bladder mucosa reveals no ulcers, tumors, or lesions - No bladder stones - No trabeculation  Retroflexion showsno significant abnormalities   Post-Procedure: - Patient tolerated the procedure well  Assessment/ Plan: No evidence of recurrent bladder tumor. Follow-up surveillance cystoscopy in 3 months.   Abbie Sons, MD

## 2019-04-05 ENCOUNTER — Encounter: Payer: Self-pay | Admitting: Urology

## 2019-05-21 DIAGNOSIS — L570 Actinic keratosis: Secondary | ICD-10-CM | POA: Diagnosis not present

## 2019-05-21 DIAGNOSIS — X32XXXA Exposure to sunlight, initial encounter: Secondary | ICD-10-CM | POA: Diagnosis not present

## 2019-05-21 DIAGNOSIS — D2271 Melanocytic nevi of right lower limb, including hip: Secondary | ICD-10-CM | POA: Diagnosis not present

## 2019-05-21 DIAGNOSIS — D2261 Melanocytic nevi of right upper limb, including shoulder: Secondary | ICD-10-CM | POA: Diagnosis not present

## 2019-05-21 DIAGNOSIS — D2272 Melanocytic nevi of left lower limb, including hip: Secondary | ICD-10-CM | POA: Diagnosis not present

## 2019-05-21 DIAGNOSIS — D2262 Melanocytic nevi of left upper limb, including shoulder: Secondary | ICD-10-CM | POA: Diagnosis not present

## 2019-05-21 DIAGNOSIS — D225 Melanocytic nevi of trunk: Secondary | ICD-10-CM | POA: Diagnosis not present

## 2019-05-21 DIAGNOSIS — D485 Neoplasm of uncertain behavior of skin: Secondary | ICD-10-CM | POA: Diagnosis not present

## 2019-07-06 ENCOUNTER — Encounter: Payer: Self-pay | Admitting: Urology

## 2019-07-06 ENCOUNTER — Other Ambulatory Visit: Payer: Self-pay

## 2019-07-06 ENCOUNTER — Ambulatory Visit (INDEPENDENT_AMBULATORY_CARE_PROVIDER_SITE_OTHER): Payer: Medicare Other | Admitting: Urology

## 2019-07-06 VITALS — BP 130/86 | HR 67 | Ht 72.0 in | Wt 267.0 lb

## 2019-07-06 DIAGNOSIS — Z8551 Personal history of malignant neoplasm of bladder: Secondary | ICD-10-CM

## 2019-07-06 DIAGNOSIS — R3129 Other microscopic hematuria: Secondary | ICD-10-CM | POA: Diagnosis not present

## 2019-07-06 DIAGNOSIS — R310 Gross hematuria: Secondary | ICD-10-CM | POA: Diagnosis not present

## 2019-07-06 LAB — URINALYSIS, COMPLETE
Bilirubin, UA: NEGATIVE
Glucose, UA: NEGATIVE
Ketones, UA: NEGATIVE
Leukocytes,UA: NEGATIVE
Nitrite, UA: NEGATIVE
Protein,UA: NEGATIVE
Specific Gravity, UA: 1.025 (ref 1.005–1.030)
Urobilinogen, Ur: 0.2 mg/dL (ref 0.2–1.0)
pH, UA: 5.5 (ref 5.0–7.5)

## 2019-07-06 LAB — MICROSCOPIC EXAMINATION: Bacteria, UA: NONE SEEN

## 2019-07-06 NOTE — Progress Notes (Signed)
   07/06/19  CC:  Chief Complaint  Patient presents with  . Cysto    Indications: -TURBT August 2019 papillarybladdertumor -High-grade urothelial carcinomaTa - Induction BCG completed November 2019  HPI: 69 y.o. male presents for quarterly surveillance cystoscopy.  He has no complaints.  Denies gross hematuria since his last visit.  Blood pressure 130/86, pulse 67, height 6' (1.829 m), weight 267 lb (121.1 kg). NED. A&Ox3.   No respiratory distress   Abd soft, NT, ND Normal phallus with bilateral descended testicles  Cystoscopy Procedure Note  Patient identification was confirmed, informed consent was obtained, and patient was prepped using Betadine solution.  Lidocaine jelly was administered per urethral meatus.     Pre-Procedure: - Inspection reveals a normal caliber urethral meatus.  Procedure: The flexible cystoscope was introduced without difficulty - No urethral strictures/lesions are present. -Mild lateral lobe enlargementprostate -Mild elevationbladder neck - Bilateral ureteral orifices identified - Bladder mucosa reveals no ulcers, tumors, or lesions - No bladder stones - No trabeculation  Retroflexion showsno significant abnormalities   Post-Procedure: - Patient tolerated the procedure well  Assessment/ Plan: -No evidence of recurrent bladder tumor -Urine cytology sent -Follow-up cystoscopy 3 months   Abbie Sons, MD

## 2019-07-06 NOTE — Patient Instructions (Signed)

## 2019-07-09 ENCOUNTER — Other Ambulatory Visit: Payer: Self-pay | Admitting: Urology

## 2019-07-24 ENCOUNTER — Other Ambulatory Visit: Payer: Self-pay

## 2019-07-24 ENCOUNTER — Ambulatory Visit: Payer: Medicare Other | Attending: Internal Medicine

## 2019-07-24 DIAGNOSIS — Z23 Encounter for immunization: Secondary | ICD-10-CM | POA: Insufficient documentation

## 2019-07-24 NOTE — Progress Notes (Signed)
   Covid-19 Vaccination Clinic  Name:  Francis Jones    MRN: [id number] DOB: January 12, 1951  07/24/2019  Mr. Morning was observed post Covid-19 immunization for 15 minutes without incidence. He was provided with Vaccine Information Sheet and instruction to access the V-Safe system.   [name] was instructed to call 911 with any severe reactions post vaccine: Marland Kitchen Difficulty breathing  . Swelling of your face and throat  . A fast heartbeat  . A bad rash all over your body  . Dizziness and weakness    Immunizations Administered    Name Date Dose VIS Date Route   Pfizer COVID-19 Vaccine 07/24/2019 11:32 AM 0.3 mL 05/22/2019 Intramuscular   Manufacturer: [place]   Lot: [id number]   [place]: [id number]

## 2019-08-19 ENCOUNTER — Ambulatory Visit: Payer: Medicare Other | Attending: Internal Medicine

## 2019-08-19 DIAGNOSIS — Z23 Encounter for immunization: Secondary | ICD-10-CM | POA: Insufficient documentation

## 2019-08-19 NOTE — Progress Notes (Signed)
   Covid-19 Vaccination Clinic  Name:  CHIDOZIE BREGENZER    MRN: [id number] DOB: 06/23/1950  08/19/2019  Mr. Buhrman was observed post Covid-19 immunization for 15 minutes without incident. He was provided with Vaccine Information Sheet and instruction to access the V-Safe system.   Mr. Duston was instructed to call 911 with any severe reactions post vaccine: Marland Kitchen Difficulty breathing  . Swelling of face and throat  . A fast heartbeat  . A bad rash all over body  . Dizziness and weakness   Immunizations Administered    Name Date Dose VIS Date Route   Pfizer COVID-19 Vaccine 08/19/2019 11:59 AM 0.3 mL 05/22/2019 Intramuscular   Manufacturer: [place]   Lot: [id number]   [place]: [id number]

## 2019-10-07 ENCOUNTER — Other Ambulatory Visit: Payer: Self-pay | Admitting: Radiology

## 2019-10-07 ENCOUNTER — Encounter: Payer: Self-pay | Admitting: Urology

## 2019-10-07 ENCOUNTER — Ambulatory Visit (INDEPENDENT_AMBULATORY_CARE_PROVIDER_SITE_OTHER): Payer: Medicare Other | Admitting: Urology

## 2019-10-07 ENCOUNTER — Other Ambulatory Visit: Payer: Self-pay

## 2019-10-07 VITALS — BP 159/83 | HR 65 | Ht 72.0 in | Wt 267.0 lb

## 2019-10-07 DIAGNOSIS — D494 Neoplasm of unspecified behavior of bladder: Secondary | ICD-10-CM

## 2019-10-07 DIAGNOSIS — D414 Neoplasm of uncertain behavior of bladder: Secondary | ICD-10-CM

## 2019-10-07 DIAGNOSIS — Z8551 Personal history of malignant neoplasm of bladder: Secondary | ICD-10-CM | POA: Diagnosis not present

## 2019-10-07 DIAGNOSIS — R310 Gross hematuria: Secondary | ICD-10-CM | POA: Diagnosis not present

## 2019-10-07 MED ORDER — GEMCITABINE CHEMO FOR BLADDER INSTILLATION 2000 MG: 2000.0000 mg | Freq: Once | INTRAVENOUS | Status: DC

## 2019-10-07 NOTE — Progress Notes (Signed)
   10/07/19  CC:  Chief Complaint  Patient presents with  . Cysto    Indications: -TURBT August 2019 papillarybladdertumor -High-grade urothelial carcinomaTa - Induction BCG completed November 2019   HPI: 69 y.o. male presents for surveillance cystoscopy.  He has no complaints.  Denies gross hematuria  Blood pressure (!) 159/83, pulse 65, height 6' (1.829 m), weight 267 lb (121.1 kg). NED. A&Ox3.   CV: RRR Lungs: Clear Abd soft, NT, ND  Normal phallus with bilateral descended testicles  Cystoscopy Procedure Note  Patient identification was confirmed, informed consent was obtained, and patient was prepped using Betadine solution.  Lidocaine jelly was administered per urethral meatus.     Pre-Procedure: - Inspection reveals a normal caliber urethral meatus.  Procedure: The flexible cystoscope was introduced without difficulty - No urethral strictures/lesions are present. - Mild lateral lobe enlargement prostate  - Mild elevation bladder neck - Bilateral ureteral orifices identified - Bladder mucosa  reveals a <10 mm papillary lesion at the right bladder neck in the 3 o'clock position - No bladder stones - No trabeculation  Retroflexion shows visualization of the above-noted papillary tumor   Post-Procedure: - Patient tolerated the procedure well  Assessment/ Plan: -Papillary bladder tumor consistent with urothelial carcinoma.  Endoscopically this may be a low-grade lesion -Schedule TURBT with post resection gemcitabine. -The procedure was discussed in detail including potential risks of bleeding, infection and anesthetic risk.  He indicated all questions were answered and desires to proceed.   Abbie Sons, MD

## 2019-10-07 NOTE — Patient Instructions (Signed)
Transurethral Resection of Bladder Tumor  Transurethral resection of a bladder tumor is the removal (resection) of a cancerous growth (tumor) on the inside wall of the bladder. The bladder is the organ that holds urine. The tumor is removed through the tube that carries urine out of the body (urethra). In a transurethral resection, a thin telescope with a light, a tiny camera, and an electric cutting edge (resectoscope) is passed through the urethra. In men, the opening of the urethra is at the end of the penis. In women, it is just above the opening of the vagina. Tell a health care provider about:  Any allergies you have.  All medicines you are taking, including vitamins, herbs, eye drops, creams, and over-the-counter medicines.  Any problems you or family members have had with anesthetic medicines.  Any blood disorders you have.  Any surgeries you have had.  Any medical conditions you have.  Any recent urinary tract infections you have had.  Whether you are pregnant or may be pregnant. What are the risks? Generally, this is a safe procedure. However, problems may occur, including:  Infection.  Bleeding.  Allergic reactions to medicines.  Damage to nearby structures or organs, such as: ? The urethra. ? The tubes that drain urine from the kidneys into the bladder (ureters).  Pain and burning during urination.  Difficulty urinating due to partial blockage of the urethra.  Inability to urinate (urinary retention). What happens before the procedure? Staying hydrated Follow instructions from your health care provider about hydration, which may include:  Up to 2 hours before the procedure - you may continue to drink clear liquids, such as water, clear fruit juice, black coffee, and plain tea.  Eating and drinking restrictions Follow instructions from your health care provider about eating and drinking, which may include:  8 hours before the procedure - stop eating heavy  meals or foods, such as meat, fried foods, or fatty foods.  6 hours before the procedure - stop eating light meals or foods, such as toast or cereal.  6 hours before the procedure - stop drinking milk or drinks that contain milk.  2 hours before the procedure - stop drinking clear liquids. Medicines Ask your health care provider about:  Changing or stopping your regular medicines. This is especially important if you are taking diabetes medicines or blood thinners.  Taking medicines such as aspirin and ibuprofen. These medicines can thin your blood. Do not take these medicines unless your health care provider tells you to take them.  Taking over-the-counter medicines, vitamins, herbs, and supplements. Tests You may have exams or tests, including:  Physical exam.  Blood tests.  Urine tests.  Electrocardiogram (ECG). This test measures the electrical activity of the heart. General instructions  Plan to have someone take you home from the hospital or clinic.  Ask your health care provider how your surgical site will be marked or identified.  Ask your health care provider what steps will be taken to help prevent infection. These may include: ? Washing skin with a germ-killing soap. ? Taking antibiotic medicine. What happens during the procedure?  An IV will be inserted into one of your veins.  You will be given one or more of the following: ? A medicine to help you relax (sedative). ? A medicine to make you fall asleep (general anesthetic). ? A medicine that is injected into your spine to numb the area below and slightly above the injection site (spinal anesthetic).  Your legs will be   placed in foot rests (stirrups) so that your legs are apart and your knees are bent.  The resectoscope will be passed through your urethra and into your bladder.  The part of your bladder that is affected by the tumor will be resected using the cutting edge of the resectoscope.  The  resectoscope will be removed.  A thin, flexible tube (catheter) will be passed through your urethra and into your bladder. The catheter will drain urine into a bag outside of your body. ? Fluid may be passed through the catheter to keep the catheter open. The procedure may vary among health care providers and hospitals. What happens after the procedure?  Your blood pressure, heart rate, breathing rate, and blood oxygen level will be monitored until you leave the hospital or clinic.  You may continue to receive fluids and medicines through an IV.  You will have some pain. You will be given pain medicine to relieve pain.  You will have a catheter to drain your urine. ? You will have blood in your urine. Your catheter may be kept in until your urine is clear. ? The amount of urine will be monitored. If necessary, your bladder may be rinsed out (irrigated) by passing fluid through your catheter.  You will be encouraged to walk around as soon as possible.  You may have to wear compression stockings. These stockings help to prevent blood clots and reduce swelling in your legs.  Do not drive for 24 hours if you were given a sedative during your procedure. Summary  Transurethral resection of a bladder tumor is the removal (resection) of a cancerous growth (tumor) on the inside wall of the bladder.  To do this procedure, your health care provider uses a thin telescope with a light, a tiny camera, and an electric cutting edge (resectoscope).  Follow your health care provider's instructions. You may need to stop or change certain medicines, and you may be told to stop eating and drinking several hours before the procedure.  Your blood pressure, heart rate, breathing rate, and blood oxygen level will be monitored until you leave the hospital or clinic.  You may have to wear compression stockings. These stockings help to prevent blood clots and reduce swelling in your legs. This information is  not intended to replace advice given to you by your health care provider. Make sure you discuss any questions you have with your health care provider. Document Revised: 12/27/2017 Document Reviewed: 12/27/2017 Elsevier Patient Education  2020 [employer]  

## 2019-10-07 NOTE — H&P (View-Only) (Signed)
   10/07/19  CC:  Chief Complaint  Patient presents with  . Cysto    Indications: -TURBT August 2019 papillarybladdertumor -High-grade urothelial carcinomaTa - Induction BCG completed November 2019   HPI: 69 y.o. male presents for surveillance cystoscopy.  He has no complaints.  Denies gross hematuria  Blood pressure (!) 159/83, pulse 65, height 6' (1.829 m), weight 267 lb (121.1 kg). NED. A&Ox3.   CV: RRR Lungs: Clear Abd soft, NT, ND  Normal phallus with bilateral descended testicles  Cystoscopy Procedure Note  Patient identification was confirmed, informed consent was obtained, and patient was prepped using Betadine solution.  Lidocaine jelly was administered per urethral meatus.     Pre-Procedure: - Inspection reveals a normal caliber urethral meatus.  Procedure: The flexible cystoscope was introduced without difficulty - No urethral strictures/lesions are present. - Mild lateral lobe enlargement prostate  - Mild elevation bladder neck - Bilateral ureteral orifices identified - Bladder mucosa  reveals a <10 mm papillary lesion at the right bladder neck in the 3 o'clock position - No bladder stones - No trabeculation  Retroflexion shows visualization of the above-noted papillary tumor   Post-Procedure: - Patient tolerated the procedure well  Assessment/ Plan: -Papillary bladder tumor consistent with urothelial carcinoma.  Endoscopically this may be a low-grade lesion -Schedule TURBT with post resection gemcitabine. -The procedure was discussed in detail including potential risks of bleeding, infection and anesthetic risk.  He indicated all questions were answered and desires to proceed.   Abbie Sons, MD

## 2019-10-08 LAB — MICROSCOPIC EXAMINATION: Bacteria, UA: NONE SEEN

## 2019-10-08 LAB — URINALYSIS, COMPLETE
Bilirubin, UA: NEGATIVE
Glucose, UA: NEGATIVE
Ketones, UA: NEGATIVE
Leukocytes,UA: NEGATIVE
Nitrite, UA: NEGATIVE
Protein,UA: NEGATIVE
Specific Gravity, UA: 1.025 (ref 1.005–1.030)
Urobilinogen, Ur: 0.2 mg/dL (ref 0.2–1.0)
pH, UA: 6 (ref 5.0–7.5)

## 2019-10-09 ENCOUNTER — Encounter: Payer: Self-pay | Admitting: Urology

## 2019-10-09 ENCOUNTER — Other Ambulatory Visit: Payer: Self-pay

## 2019-10-09 ENCOUNTER — Encounter
Admission: RE | Admit: 2019-10-09 | Discharge: 2019-10-09 | Disposition: A | Discharge status: Home / Self Care | Payer: Medicare Other | Source: Ambulatory Visit | Attending: Urology | Admitting: Urology

## 2019-10-09 HISTORY — DX: Other specified health status: Z78.9

## 2019-10-09 LAB — CULTURE, URINE COMPREHENSIVE

## 2019-10-09 NOTE — Patient Instructions (Signed)
OVID TESTING Date:10/12/19 Testing site:  [facility] [address] Thru Hours:  [id number] am - 1:00 pm Once you are tested, you are asked to stay quarantined (avoiding public places) until after your surgery.   Your procedure is scheduled on: 10/13/19 Report to Day Surgery on the 2nd floor of the [facility]. To find out your arrival time, please call [phone] between 1PM - 3PM on: 10/12/19  REMEMBER: Instructions that are not followed completely may result in serious medical risk, up to and including death; or upon the discretion of your surgeon and anesthesiologist your surgery may need to be rescheduled.  Do not eat food after midnight the night before surgery.  No gum chewing, lozengers or hard candies.  You may however, drink CLEAR liquids up to 2 hours before you are scheduled to arrive for your surgery. Do not drink anything within 2 hours of your scheduled arrival time.  Clear liquids include: - water  - apple juice without pulp - gatorade (not RED) - black coffee or tea (Do NOT add milk or creamers to the coffee or tea) Do NOT drink anything that is not on this list.  TAKE THESE MEDICATIONS THE MORNING OF SURGERY WITH A SIP OF WATER:  Zyrtec   Stop Anti-inflammatories (NSAIDS) such as Advil, Aleve, Ibuprofen, Motrin, Naproxen, Naprosyn and Aspirin based products such as Excedrin, Goodys Powder, BC Powder. (May take Tylenol or Acetaminophen if needed.)  Stop ANY OVER THE COUNTER supplements until after surgery. (May continue Vitamin D, Vitamin B, and multivitamin.)  No Alcohol for 24 hours before or after surgery.  No Smoking including e-cigarettes for 24 hours prior to surgery.  No chewable tobacco products for at least 6 hours prior to surgery.  No nicotine patches on the day of surgery.  Do not use any "recreational" drugs for at least a week prior to your surgery.  Please be advised that the combination of cocaine and anesthesia may  have negative outcomes, up to and including death. If you test positive for cocaine, your surgery will be cancelled.  On the morning of surgery brush your teeth with toothpaste and water, you may rinse your mouth with mouthwash if you wish. Do not swallow any toothpaste or mouthwash.  Do not wear jewelry, make-up, hairpins, clips or nail polish.  Do not wear lotions, powders, or perfumes.   Do not shave 48 hours prior to surgery.   Contact lenses, hearing aids and dentures may not be worn into surgery.  Do not bring valuables to the hospital. [facility] is not responsible for any missing/lost belongings or valuables.   Notify your doctor if there is any change in your medical condition (cold, fever, infection).  Wear comfortable clothing (specific to your surgery type) to the hospital.  Plan for stool softeners for home use; pain medications have a tendency to cause constipation. You can also help prevent constipation by eating foods high in fiber such as fruits and vegetables and drinking plenty of fluids as your diet allows.  After surgery, you can help prevent lung complications by doing breathing exercises.  Take deep breaths and cough every 1-2 hours. Your doctor may order a device called an Incentive Spirometer to help you take deep breaths. When coughing or sneezing, hold a pillow firmly against your incision with both hands. This is called "splinting." Doing this helps protect your incision. It also decreases belly discomfort.  If you are being admitted to the hospital overnight, leave your  suitcase in the car. After surgery it may be brought to your room.  If you are being discharged the day of surgery, you will not be allowed to drive home. You will need a responsible adult (18 years or older) to drive you home and stay with you that night.   If you are taking public transportation, you will need to have a responsible adult (18 years or older) with you. Please confirm  with your physician that it is acceptable to use public transportation.   Please call the [facility] at [phone] if you have any questions about these instructions.  Visitation Policy:  Patients undergoing a surgery or procedure may have one family member or support person with them as long as that person is not COVID-19 positive or experiencing its symptoms.  That person may remain in the waiting area during the procedure.  Children under 19 years of age may have both parents or legal guardians with them during their hospital stay.   Inpatient Visitation Update:  Two designated support people may visit a patient during visiting hours 7 am to 8 pm. It must be the same two designated people for the duration of the patient stay. The visitors may come and go during the day, and there is no switching out to have different visitors. A mask must be worn at all times, including in the patient room.

## 2019-10-12 ENCOUNTER — Other Ambulatory Visit
Admission: RE | Admit: 2019-10-12 | Discharge: 2019-10-12 | Disposition: A | Discharge status: Home / Self Care | Payer: Medicare Other | Source: Ambulatory Visit | Attending: Urology | Admitting: Urology

## 2019-10-12 ENCOUNTER — Other Ambulatory Visit: Payer: Self-pay

## 2019-10-12 DIAGNOSIS — Z20822 Contact with and (suspected) exposure to covid-19: Secondary | ICD-10-CM | POA: Diagnosis not present

## 2019-10-12 DIAGNOSIS — Z01812 Encounter for preprocedural laboratory examination: Secondary | ICD-10-CM | POA: Diagnosis not present

## 2019-10-12 LAB — BASIC METABOLIC PANEL
Anion gap: 10 (ref 5–15)
BUN: 12 mg/dL (ref 8–23)
CO2: 22 mmol/L (ref 22–32)
Calcium: 8.8 mg/dL — ABNORMAL LOW (ref 8.9–10.3)
Chloride: 107 mmol/L (ref 98–111)
Creatinine, Ser: 0.67 mg/dL (ref 0.61–1.24)
GFR calc Af Amer: >60 mL/min (ref >60)
GFR calc non Af Amer: >60 mL/min (ref >60)
Glucose, Bld: 168 mg/dL — ABNORMAL HIGH (ref 70–99)
Potassium: 4 mmol/L (ref 3.5–5.1)
Sodium: 139 mmol/L (ref 135–145)

## 2019-10-12 LAB — CBC
HCT: 45.3 % (ref 39.0–52.0)
Hemoglobin: 15.5 g/dL (ref 13.0–17.0)
MCH: 29.4 pg (ref 26.0–34.0)
MCHC: 34.2 g/dL (ref 30.0–36.0)
MCV: 86 fL (ref 80.0–100.0)
Platelets: 201 10*3/uL (ref 150–400)
RBC: 5.27 MIL/uL (ref 4.22–5.81)
RDW: 12.3 % (ref 11.5–15.5)
WBC: 7.3 10*3/uL (ref 4.0–10.5)
nRBC: 0 % (ref 0.0–0.2)

## 2019-10-12 LAB — SARS CORONAVIRUS 2 (TAT 6-24 HRS): SARS Coronavirus 2: NEGATIVE

## 2019-10-13 ENCOUNTER — Ambulatory Visit: Payer: Medicare Other | Admitting: Anesthesiology

## 2019-10-13 ENCOUNTER — Other Ambulatory Visit: Payer: Self-pay

## 2019-10-13 ENCOUNTER — Encounter: Payer: Self-pay | Admitting: Urology

## 2019-10-13 ENCOUNTER — Ambulatory Visit
Admission: RE | Admit: 2019-10-13 | Discharge: 2019-10-13 | Disposition: A | Discharge status: Home / Self Care | Payer: Medicare Other | Attending: Urology | Admitting: Urology

## 2019-10-13 ENCOUNTER — Encounter
Admission: RE | Disposition: A | Discharge status: Home / Self Care | Payer: Self-pay | Source: Home / Self Care | Attending: Urology

## 2019-10-13 DIAGNOSIS — D494 Neoplasm of unspecified behavior of bladder: Secondary | ICD-10-CM | POA: Diagnosis not present

## 2019-10-13 DIAGNOSIS — E669 Obesity, unspecified: Secondary | ICD-10-CM | POA: Insufficient documentation

## 2019-10-13 DIAGNOSIS — D414 Neoplasm of uncertain behavior of bladder: Secondary | ICD-10-CM

## 2019-10-13 DIAGNOSIS — Z6836 Body mass index (BMI) 36.0-36.9, adult: Secondary | ICD-10-CM | POA: Insufficient documentation

## 2019-10-13 DIAGNOSIS — Z87891 Personal history of nicotine dependence: Secondary | ICD-10-CM | POA: Insufficient documentation

## 2019-10-13 DIAGNOSIS — C675 Malignant neoplasm of bladder neck: Secondary | ICD-10-CM | POA: Diagnosis not present

## 2019-10-13 HISTORY — PX: TRANSURETHRAL RESECTION OF BLADDER TUMOR: SHX2575

## 2019-10-13 SURGERY — TURBT (TRANSURETHRAL RESECTION OF BLADDER TUMOR)
Anesthesia: General | Site: Bladder

## 2019-10-13 MED ORDER — PROPOFOL 10 MG/ML IV BOLUS
INTRAVENOUS | Status: DC | PRN
  Administered 2019-10-13: 150 mg via INTRAVENOUS

## 2019-10-13 MED ORDER — FAMOTIDINE 20 MG PO TABS: 20.0000 mg | ORAL_TABLET | Freq: Once | ORAL | Status: AC

## 2019-10-13 MED ORDER — ONDANSETRON HCL 4 MG/2ML IJ SOLN
INTRAMUSCULAR | Status: AC
  Filled 2019-10-13: qty 2

## 2019-10-13 MED ORDER — CEFAZOLIN SODIUM-DEXTROSE 2-4 GM/100ML-% IV SOLN
INTRAVENOUS | Status: AC
  Filled 2019-10-13: qty 100

## 2019-10-13 MED ORDER — ONDANSETRON HCL 4 MG/2ML IJ SOLN: 4.0000 mg | Freq: Once | INTRAMUSCULAR | Status: DC | PRN

## 2019-10-13 MED ORDER — FENTANYL CITRATE (PF) 100 MCG/2ML IJ SOLN: 25.0000 ug | INTRAMUSCULAR | Status: DC | PRN

## 2019-10-13 MED ORDER — LIDOCAINE HCL (CARDIAC) PF 100 MG/5ML IV SOSY
PREFILLED_SYRINGE | INTRAVENOUS | Status: DC | PRN
  Administered 2019-10-13: 100 mg via INTRAVENOUS

## 2019-10-13 MED ORDER — GEMCITABINE CHEMO FOR BLADDER INSTILLATION 2000 MG
INTRAVENOUS | Status: DC | PRN
  Administered 2019-10-13: 2000 mg via INTRAVESICAL

## 2019-10-13 MED ORDER — DEXAMETHASONE SODIUM PHOSPHATE 10 MG/ML IJ SOLN
INTRAMUSCULAR | Status: AC
  Filled 2019-10-13: qty 1

## 2019-10-13 MED ORDER — SEVOFLURANE IN SOLN
RESPIRATORY_TRACT | Status: AC
  Filled 2019-10-13: qty 250

## 2019-10-13 MED ORDER — FENTANYL CITRATE (PF) 100 MCG/2ML IJ SOLN
INTRAMUSCULAR | Status: AC
  Filled 2019-10-13: qty 2

## 2019-10-13 MED ORDER — LIDOCAINE HCL (PF) 2 % IJ SOLN
INTRAMUSCULAR | Status: AC
  Filled 2019-10-13: qty 5

## 2019-10-13 MED ORDER — LACTATED RINGERS IV SOLN: INTRAVENOUS | Status: DC

## 2019-10-13 MED ORDER — FENTANYL CITRATE (PF) 100 MCG/2ML IJ SOLN
INTRAMUSCULAR | Status: DC | PRN
  Administered 2019-10-13: 50 ug via INTRAVENOUS

## 2019-10-13 MED ORDER — DEXAMETHASONE SODIUM PHOSPHATE 10 MG/ML IJ SOLN
INTRAMUSCULAR | Status: DC | PRN
  Administered 2019-10-13: 10 mg via INTRAVENOUS

## 2019-10-13 MED ORDER — ONDANSETRON HCL 4 MG/2ML IJ SOLN
INTRAMUSCULAR | Status: DC | PRN
  Administered 2019-10-13: 4 mg via INTRAVENOUS

## 2019-10-13 MED ORDER — FAMOTIDINE 20 MG PO TABS
ORAL_TABLET | ORAL | Status: AC
  Administered 2019-10-13: 20 mg via ORAL
  Filled 2019-10-13: qty 1

## 2019-10-13 MED ORDER — URIBEL 118 MG PO CAPS
1.0000 | ORAL_CAPSULE | Freq: Three times a day (TID) | ORAL | 0 refills | Status: DC | PRN
Start: 2019-10-13 — End: 2019-12-03

## 2019-10-13 MED ORDER — CEFAZOLIN SODIUM-DEXTROSE 2-4 GM/100ML-% IV SOLN
2.0000 g | INTRAVENOUS | Status: AC
  Administered 2019-10-13: 2 g via INTRAVENOUS

## 2019-10-13 SURGICAL SUPPLY — 22 items
BAG DRAIN CYSTO-URO LG1000N (MISCELLANEOUS) ×3 IMPLANT
BAG URINE DRAIN 2000ML AR STRL (UROLOGICAL SUPPLIES) ×3 IMPLANT
CATH FOLEY 2WAY 18X30 (CATHETERS) IMPLANT
CATH FOLEY 2WAY SIL 18X30 (CATHETERS)
DRAPE UTILITY 15X26 TOWEL STRL (DRAPES) ×3 IMPLANT
DRSG TELFA 4X3 1S NADH ST (GAUZE/BANDAGES/DRESSINGS) ×3 IMPLANT
ELECT LOOP 22F BIPOLAR SML (ELECTROSURGICAL) ×3
ELECT REM PT RETURN 9FT ADLT (ELECTROSURGICAL)
ELECTRODE LOOP 22F BIPOLAR SML (ELECTROSURGICAL) IMPLANT
ELECTRODE REM PT RTRN 9FT ADLT (ELECTROSURGICAL) IMPLANT
GLOVE BIO SURGEON STRL SZ8 (GLOVE) ×3 IMPLANT
GOWN STRL REUS W/ TWL LRG LVL3 (GOWN DISPOSABLE) ×1 IMPLANT
GOWN STRL REUS W/TWL LRG LVL3 (GOWN DISPOSABLE) ×2
GOWN STRL REUS W/TWL XL LVL4 (GOWN DISPOSABLE) ×3 IMPLANT
IV NS IRRIG 3000ML ARTHROMATIC (IV SOLUTION) ×6 IMPLANT
KIT TURNOVER CYSTO (KITS) ×3 IMPLANT
LOOP CUT BIPOLAR 24F LRG (ELECTROSURGICAL) IMPLANT
PACK CYSTO AR (MISCELLANEOUS) ×3 IMPLANT
SET IRRIG Y TYPE TUR BLADDER L (SET/KITS/TRAYS/PACK) ×3 IMPLANT
SURGILUBE 2OZ TUBE FLIPTOP (MISCELLANEOUS) ×3 IMPLANT
SYRINGE IRR TOOMEY STRL 70CC (SYRINGE) ×3 IMPLANT
WATER STERILE IRR 1000ML POUR (IV SOLUTION) ×3 IMPLANT

## 2019-10-13 NOTE — Anesthesia Postprocedure Evaluation (Signed)
Anesthesia Post Note  Patient: NAVIAN AGUILA  Procedure(s) Performed: TRANSURETHRAL RESECTION OF BLADDER TUMOR (TURBT) with gemcitabine (N/A Bladder)  Patient location during evaluation: PACU Anesthesia Type: General Level of consciousness: awake and alert Pain management: pain level controlled Vital Signs Assessment: post-procedure vital signs reviewed and stable Respiratory status: spontaneous breathing, nonlabored ventilation, respiratory function stable and patient connected to nasal cannula oxygen Cardiovascular status: blood pressure returned to baseline and stable Postop Assessment: no apparent nausea or vomiting Anesthetic complications: no     Last Vitals:  Vitals:   10/13/19 1623 10/13/19 1636  BP: (!) 142/90 140/78  Pulse: (!) 56 61  Resp: 14 16  Temp: 36.4 C 36.5 C  SpO2: 94% 97%    Last Pain:  Vitals:   10/13/19 1636  TempSrc: Temporal  PainSc: 0-No pain                 Arita Miss

## 2019-10-13 NOTE — Transfer of Care (Signed)
Immediate Anesthesia Transfer of Care Note  Patient: Francis Jones  Procedure(s) Performed: Procedure(s): TRANSURETHRAL RESECTION OF BLADDER TUMOR (TURBT) with gemcitabine (N/A)  Patient Location: PACU  Anesthesia Type:General  Level of Consciousness: sedated  Airway & Oxygen Therapy: Patient Spontanous Breathing and Patient connected to face mask oxygen  Post-op Assessment: Report given to RN and Post -op Vital signs reviewed and stable  Post vital signs: Reviewed and stable  Last Vitals:  Vitals:   10/13/19 1321 10/13/19 1538  BP: (!) 159/97 (!) 160/91  Pulse: 67 (!) 59  Resp: 20 11  Temp: (!) 36.3 C 36.6 C  SpO2: [id number]    Complications: No apparent anesthesia complications

## 2019-10-13 NOTE — Anesthesia Preprocedure Evaluation (Signed)
Anesthesia Evaluation  Patient identified by MRN, date of birth, ID band Patient awake    Reviewed: Allergy & Precautions, NPO status , Patient's Chart, lab work & pertinent test results  History of Anesthesia Complications (+) PONV and history of anesthetic complications  Airway Mallampati: II  TM Distance: >3 FB Neck ROM: Full    Dental no notable dental hx.    Pulmonary neg sleep apnea, neg COPD, former smoker,    breath sounds clear to auscultation- rhonchi (-) wheezing      Cardiovascular Exercise Tolerance: Good (-) hypertension(-) CAD, (-) Past MI, (-) Cardiac Stents and (-) CABG  Rhythm:Regular Rate:Normal - Systolic murmurs and - Diastolic murmurs    Neuro/Psych neg Seizures negative neurological ROS  negative psych ROS   GI/Hepatic negative GI ROS, Neg liver ROS,   Endo/Other  negative endocrine ROSneg diabetes  Renal/GU negative Renal ROS     Musculoskeletal negative musculoskeletal ROS (+)   Abdominal (+) + obese,   Peds  Hematology negative hematology ROS (+)   Anesthesia Other Findings Past Medical History: No date: Medical history non-contributory No date: PONV (postoperative nausea and vomiting)   Reproductive/Obstetrics                             Anesthesia Physical Anesthesia Plan  ASA: II  Anesthesia Plan: General   Post-op Pain Management:    Induction: Intravenous  PONV Risk Score and Plan: 2 and Dexamethasone, Ondansetron and Treatment may vary due to age or medical condition  Airway Management Planned: LMA  Additional Equipment:   Intra-op Plan:   Post-operative Plan:   Informed Consent: I have reviewed the patients History and Physical, chart, labs and discussed the procedure including the risks, benefits and alternatives for the proposed anesthesia with the patient or authorized representative who has indicated his/her understanding and acceptance.      Dental advisory given  Plan Discussed with: CRNA and Anesthesiologist  Anesthesia Plan Comments:         Anesthesia Quick Evaluation

## 2019-10-13 NOTE — Discharge Instructions (Signed)
AMBULATORY SURGERY  DISCHARGE INSTRUCTIONS   1) The drugs that you were given will stay in your system until tomorrow so for the next 24 hours you should not:  A) Drive an automobile B) Make any legal decisions C) Drink any alcoholic beverage   2) You may resume regular meals tomorrow.  Today it is better to start with liquids and gradually work up to solid foods.  You may eat anything you prefer, but it is better to start with liquids, then soup and crackers, and gradually work up to solid foods.   3) Please notify your doctor immediately if you have any unusual bleeding, trouble breathing, redness and pain at the surgery site, drainage, fever, or pain not relieved by medication.    4) Additional Instructions:    Transurethral Resection of Bladder Tumor (TURBT) or Bladder Biopsy   Definition:  Transurethral Resection of the Bladder Tumor is a surgical procedure used to diagnose and remove tumors within the bladder.   General instructions:     Your recent bladder surgery requires very little post hospital care but some definite precautions.  Despite the fact that no skin incisions were used, the area around the bladder incisions are raw and covered with scabs to promote healing and prevent bleeding. Certain precautions are needed to insure that the scabs are not disturbed over the next 2-4 weeks while the healing proceeds.  Because the raw surface inside your bladder and the irritating effects of urine you may expect frequency of urination and/or urgency (a stronger desire to urinate) and perhaps even getting up at night more often. This will usually resolve or improve slowly over the healing period. You may see some blood in your urine over the first 6 weeks. Do not be alarmed, even if the urine was clear for a while. Get off your feet and drink lots of fluids until clearing occurs. If you start to pass clots or don't improve call us.  Diet:  You may return to your normal  diet immediately. Because of the raw surface of your bladder, alcohol, spicy foods, foods high in acid and drinks with caffeine may cause irritation or frequency and should be used in moderation. To keep your urine flowing freely and avoid constipation, drink plenty of fluids during the day (8-10 glasses). Tip: Avoid cranberry juice because it is very acidic.  Activity:  Your physical activity doesn't need to be restricted. However, if you are very active, you may see some blood in the urine. We suggest that you reduce your activity under the circumstances until the bleeding has stopped.  Bowels:  It is important to keep your bowels regular during the postoperative period. Straining with bowel movements can cause bleeding. A bowel movement every other day is reasonable. Use a mild laxative if needed, such as milk of magnesia 2-3 tablespoons, or 2 Dulcolax tablets. Call if you continue to have problems. If you had been taking narcotics for pain, before, during or after your surgery, you may be constipated. Take a laxative if necessary.    Medication:  You should resume your pre-surgery medications unless told not to.  All medication should be taken as prescribed until the bottles are finished unless you are having an unusual reaction to one of the drugs.  A prescription for Uribel was sent to your pharmacy which will help postop frequency, urgency, burning.  This medication is not always covered by insurance and if too expensive is an option but not a necessity.  [facility] [address], [place], [place] [address] [phone]       Please contact your physician with any problems or Same Day Surgery at [phone], Monday through Friday 6 am to 4 pm, or [place] at [facility] number at [phone].

## 2019-10-13 NOTE — Op Note (Signed)
Preoperative diagnosis: 1. Bladder tumor (< 2cm)  Postoperative diagnosis:  1. Bladder tumor (< 2cm)  Procedure:  1. Cystoscopy 2. Transurethral resection of bladder tumor (small) 3. Instillation intravesical gemcitabine  Surgeon: Nicki Reaper C. Abbygayle Helfand, M.D.  Anesthesia: General  Complications: None  Intraoperative findings:  1. Bladder tumor: ~1 cm papillary tumor right bladder neck   EBL: Minimal  Specimens: 1. Bladder tumor   Indication: Francis Jones is a 69 y.o. male with a history of a high-grade  Ta urothelial carcinoma the bladder in August 2019.  He completed BCG  induction.  On recent surveillance cystoscopy he was found to have a 1 cm papillary tumor at the right bladder neck. After reviewing the management options for treatment, he elected to proceed with the above surgical procedure(s). We have discussed the potential benefits and risks of the procedure, side effects of the proposed treatment, the likelihood of the patient achieving the goals of the procedure, and any potential problems that might occur during the procedure or recuperation. Informed consent has been obtained.  Description of procedure:  The patient was taken to the operating room and general anesthesia was induced.  The patient was placed in the dorsal lithotomy position, prepped and draped in the usual sterile fashion, and preoperative antibiotics were administered. A preoperative time-out was performed.   A 26 French continuous-flow resectoscope sheath with visual obturator was unable to be passed per urethra and a 24 French sheath was unable to be passed.  The distal urethra was dilated with Leander Rams sounds from 22-30 [place] and the 24 [place] sheath was then passed without difficulty.  The urethra was normal in caliber without stricture.  The prostate demonstrated mild lateral lobe enlargement with mild to moderate bladder neck elevation.  The visual after it is removed and replaced with an Beatrix Fetters  resectoscope with loop and the bladder mucosa was inspected in its entirety.  The papillary lesion at the bladder neck in the 9 o'clock position was identified.  No other mucosal lesions were found.  Using bipolar loop cautery resection, the entire tumor was resected and removed for permanent pathologic analysis.    Hemostasis was then achieved with the loop cautery and the bladder was emptied and reinspected with no bleeding noted at the end of the procedure.    The bladder was then emptied and a 16 French Foley catheter was placed with return of pink-tinged effluent upon irrigation.  The patient appeared to tolerate the procedure well and without complications.  The patient was able to be awakened and transferred to the recovery unit in satisfactory condition.   2000 mg of intravesical gemcitabine was instilled to the bladder.  This was allowed to dwell in the PACU for 1 hour.  This was well-tolerated.  After 1 hour, the catheter was drained and removed.  Plan: A virtual visit will be scheduled for pathology review.   Julienne Vogler C. Bernardo Heater,  MD

## 2019-10-13 NOTE — Anesthesia Procedure Notes (Signed)
Procedure Name: LMA Insertion Date/Time: 10/13/2019 2:56 PM Performed by: Doreen Salvage, CRNA Pre-anesthesia Checklist: Patient identified, Patient being monitored, Timeout performed, Emergency Drugs available and Suction available Patient Re-evaluated:Patient Re-evaluated prior to induction Oxygen Delivery Method: Circle system utilized Preoxygenation: Pre-oxygenation with 100% oxygen Induction Type: IV induction Ventilation: Mask ventilation without difficulty LMA: LMA inserted LMA Size: 5.0 Tube type: Oral Number of attempts: 1 Placement Confirmation: positive ETCO2 and breath sounds checked- equal and bilateral Tube secured with: Tape Dental Injury: Teeth and Oropharynx as per pre-operative assessment

## 2019-10-13 NOTE — Interval H&P Note (Signed)
History and Physical Interval Note:  10/13/2019 2:32 PM  Francis Jones  has presented today for surgery, with the diagnosis of bladder tumor.  The various methods of treatment have been discussed with the patient and family. After consideration of risks, benefits and other options for treatment, the patient has consented to  Procedure(s): TRANSURETHRAL RESECTION OF BLADDER TUMOR (TURBT) with gemcitabine (N/A) as a surgical intervention.  The patient's history has been reviewed, patient examined, no change in status, stable for surgery.  I have reviewed the patient's chart and labs.  Questions were answered to the patient's satisfaction.     [place]

## 2019-10-14 ENCOUNTER — Telehealth: Payer: Self-pay | Admitting: Radiology

## 2019-10-14 ENCOUNTER — Telehealth: Payer: Self-pay | Admitting: Urology

## 2019-10-14 NOTE — Telephone Encounter (Signed)
App made patient is aware  Michelle 

## 2019-10-14 NOTE — Telephone Encounter (Signed)
-----   Message from Abbie Sons, MD sent at 10/13/2019  3:47 PM EDT ----- Regarding: Path results Please schedule virtual telephone follow-up for path results

## 2019-10-14 NOTE — Telephone Encounter (Signed)
Patient reports hematuria and clots have resolved. Wants Dr Bernardo Heater to be aware.

## 2019-10-15 ENCOUNTER — Encounter: Payer: Self-pay | Admitting: Urology

## 2019-10-15 LAB — SURGICAL PATHOLOGY

## 2019-10-21 ENCOUNTER — Encounter: Payer: Self-pay | Admitting: Urology

## 2019-10-21 ENCOUNTER — Telehealth (INDEPENDENT_AMBULATORY_CARE_PROVIDER_SITE_OTHER): Payer: Medicare Other | Admitting: Urology

## 2019-10-21 ENCOUNTER — Other Ambulatory Visit: Payer: Self-pay

## 2019-10-21 DIAGNOSIS — C679 Malignant neoplasm of bladder, unspecified: Secondary | ICD-10-CM | POA: Diagnosis not present

## 2019-10-21 NOTE — Progress Notes (Signed)
error 

## 2019-10-21 NOTE — Progress Notes (Signed)
Virtual Visit via Telephone Note  I connected with Francis Jones on 10/21/19 at  8:00 AM EDT by telephone and verified that I am speaking with the correct person using two identifiers.  Location: Patient: Home Provider: [facility]   I discussed the limitations, risks, security and privacy concerns of performing an evaluation and management service by telephone and the availability of in person appointments. I also discussed with the patient that there may be a patient responsible charge related to this service. The patient expressed understanding and agreed to proceed.   History of Present Illness: 69 y.o. male with a history of high-grade urothelial carcinoma the bladder in 2019 recently found to have a small bladder neck tumor which was resected on 10/13/2019.  He received post resection intravesical gemcitabine.  He had occasional blood clots postoperatively but states he is doing well.  Path: Low-grade, noninvasive urothelial carcinoma the bladder   Observations/Objective: N/A  Assessment and Plan: -History high-grade urothelial carcinoma the bladder -Recent recurrence which was low-grade and he received post resection intravesical gemcitabine.  The pathology report was discussed in detail.  He will need no further therapy at this time  Follow Up Instructions: -Schedule office surveillance cystoscopy in 6 months   I discussed the assessment and treatment plan with the patient. The patient was provided an opportunity to ask questions and all were answered. The patient agreed with the plan and demonstrated an understanding of the instructions.   The patient was advised to call back or seek an in-person evaluation if the symptoms worsen or if the condition fails to improve as anticipated.  I provided 10 minutes of non-face-to-face time during this encounter.   Abbie Sons, MD

## 2019-10-23 NOTE — Telephone Encounter (Signed)
-----   Message from Abbie Sons, MD sent at 10/21/2019  9:14 AM EDT ----- Please schedule surveillance cystoscopy 6 months

## 2019-10-23 NOTE — Telephone Encounter (Signed)
App made and mailed ° °Francis Jones ° °

## 2019-11-25 ENCOUNTER — Encounter: Payer: Self-pay | Admitting: Urology

## 2019-11-27 ENCOUNTER — Other Ambulatory Visit: Payer: Self-pay

## 2019-11-27 ENCOUNTER — Other Ambulatory Visit: Payer: Medicare Other

## 2019-11-27 DIAGNOSIS — D494 Neoplasm of unspecified behavior of bladder: Secondary | ICD-10-CM

## 2019-11-27 DIAGNOSIS — C679 Malignant neoplasm of bladder, unspecified: Secondary | ICD-10-CM

## 2019-12-01 ENCOUNTER — Other Ambulatory Visit: Payer: Self-pay

## 2019-12-01 LAB — URINALYSIS, COMPLETE
Bilirubin, UA: NEGATIVE
Leukocytes,UA: NEGATIVE
Nitrite, UA: NEGATIVE
Protein,UA: NEGATIVE
Specific Gravity, UA: 1.025 (ref 1.005–1.030)
Urobilinogen, Ur: 1 mg/dL (ref 0.2–1.0)
pH, UA: 6 (ref 5.0–7.5)

## 2019-12-01 LAB — MICROSCOPIC EXAMINATION: Bacteria, UA: NONE SEEN

## 2019-12-03 ENCOUNTER — Telehealth: Payer: Self-pay | Admitting: *Deleted

## 2019-12-03 LAB — CULTURE, URINE COMPREHENSIVE

## 2019-12-03 MED ORDER — URIBEL 118 MG PO CAPS: 1.0000 | ORAL_CAPSULE | Freq: Three times a day (TID) | ORAL | 0 refills | Status: DC | PRN

## 2019-12-03 NOTE — Telephone Encounter (Signed)
Patient notified, voiced understanding.

## 2019-12-03 NOTE — Telephone Encounter (Signed)
-----   Message from Abbie Sons, MD sent at 12/03/2019 11:45 AM EDT ----- Can let patient know his urine culture grew a skin contaminant and that no treatment is needed

## 2020-03-10 ENCOUNTER — Ambulatory Visit: Payer: Medicare Other | Attending: Internal Medicine

## 2020-03-10 DIAGNOSIS — Z23 Encounter for immunization: Secondary | ICD-10-CM

## 2020-03-10 NOTE — Progress Notes (Signed)
   Covid-19 Vaccination Clinic  Name:  Francis Jones    MRN: [id number] DOB: 10-28-1950  03/10/2020  [name] was observed post Covid-19 immunization for 15 minutes without incident. He was provided with Vaccine Information Sheet and instruction to access the V-Safe system.   [name] was instructed to call 911 with any severe reactions post vaccine: Marland Kitchen Difficulty breathing  . Swelling of face and throat  . A fast heartbeat  . A bad rash all over body  . Dizziness and weakness

## 2020-04-25 ENCOUNTER — Encounter: Payer: Self-pay | Admitting: Urology

## 2020-04-25 ENCOUNTER — Ambulatory Visit (INDEPENDENT_AMBULATORY_CARE_PROVIDER_SITE_OTHER): Payer: Medicare Other | Admitting: Urology

## 2020-04-25 ENCOUNTER — Other Ambulatory Visit: Payer: Self-pay

## 2020-04-25 VITALS — BP 149/81 | HR 69 | Ht 69.0 in | Wt 270.0 lb

## 2020-04-25 DIAGNOSIS — Z8551 Personal history of malignant neoplasm of bladder: Secondary | ICD-10-CM

## 2020-04-25 DIAGNOSIS — D494 Neoplasm of unspecified behavior of bladder: Secondary | ICD-10-CM | POA: Diagnosis not present

## 2020-04-26 ENCOUNTER — Encounter: Payer: Self-pay | Admitting: Urology

## 2020-04-26 LAB — URINALYSIS, COMPLETE
Bilirubin, UA: NEGATIVE
Glucose, UA: NEGATIVE
Ketones, UA: NEGATIVE
Nitrite, UA: NEGATIVE
Protein,UA: NEGATIVE
Specific Gravity, UA: 1.02 (ref 1.005–1.030)
Urobilinogen, Ur: 2 mg/dL — ABNORMAL HIGH (ref 0.2–1.0)
pH, UA: 7 (ref 5.0–7.5)

## 2020-04-26 LAB — MICROSCOPIC EXAMINATION: Bacteria, UA: NONE SEEN

## 2020-04-26 NOTE — Progress Notes (Signed)
   04/26/20  CC:  Chief Complaint  Patient presents with  . Cysto    Indications: -TURBT August 2019 papillarybladdertumor -High-grade urothelial carcinomaTa - Induction BCG completed November 2019 -TURBT 10/13/2019 10 mm papillary lesion right bladder neck, received post resection gemcitabine -Pathology Ta urothelial carcinoma (low-grade)  HPI: Mr. Stephanie Coup has no complaints today  Blood pressure (!) 149/81, pulse 69, height 5\' 9"  (1.753 m), weight 270 lb (122.5 kg). NED. A&Ox3.   No respiratory distress   Abd soft, NT, ND Normal phallus with bilateral descended testicles  Cystoscopy Procedure Note  Patient identification was confirmed, informed consent was obtained, and patient was prepped using Betadine solution.  Lidocaine jelly was administered per urethral meatus.     Pre-Procedure: - Inspection reveals a normal caliber urethral meatus.  Procedure: The flexible cystoscope was introduced without difficulty - No urethral strictures/lesions are present. - Mild lateral lobe enlargement prostate  - Mild elevation bladder neck - Bilateral ureteral orifices identified - Bladder mucosa  reveals no ulcers, tumors, or lesions - No bladder stones - No trabeculation  Retroflexion shows no abnormalities   Post-Procedure: - Patient tolerated the procedure well  Assessment/ Plan:  No evidence recurrence urothelial carcinoma  Follow-up surveillance cystoscopy 6 months   Abbie Sons, MD

## 2020-05-19 DIAGNOSIS — D225 Melanocytic nevi of trunk: Secondary | ICD-10-CM | POA: Diagnosis not present

## 2020-05-19 DIAGNOSIS — D2262 Melanocytic nevi of left upper limb, including shoulder: Secondary | ICD-10-CM | POA: Diagnosis not present

## 2020-05-19 DIAGNOSIS — D2271 Melanocytic nevi of right lower limb, including hip: Secondary | ICD-10-CM | POA: Diagnosis not present

## 2020-05-19 DIAGNOSIS — D2261 Melanocytic nevi of right upper limb, including shoulder: Secondary | ICD-10-CM | POA: Diagnosis not present

## 2020-05-19 DIAGNOSIS — L821 Other seborrheic keratosis: Secondary | ICD-10-CM | POA: Diagnosis not present

## 2020-05-19 DIAGNOSIS — X32XXXA Exposure to sunlight, initial encounter: Secondary | ICD-10-CM | POA: Diagnosis not present

## 2020-05-19 DIAGNOSIS — L570 Actinic keratosis: Secondary | ICD-10-CM | POA: Diagnosis not present

## 2020-05-19 DIAGNOSIS — D2272 Melanocytic nevi of left lower limb, including hip: Secondary | ICD-10-CM | POA: Diagnosis not present

## 2020-08-30 DIAGNOSIS — H04123 Dry eye syndrome of bilateral lacrimal glands: Secondary | ICD-10-CM | POA: Diagnosis not present

## 2020-10-05 DIAGNOSIS — Z23 Encounter for immunization: Secondary | ICD-10-CM | POA: Diagnosis not present

## 2020-10-24 ENCOUNTER — Other Ambulatory Visit: Payer: Medicare Other | Admitting: Urology

## 2020-11-11 ENCOUNTER — Ambulatory Visit (INDEPENDENT_AMBULATORY_CARE_PROVIDER_SITE_OTHER): Payer: Medicare Other | Admitting: Urology

## 2020-11-11 ENCOUNTER — Encounter: Payer: Self-pay | Admitting: Urology

## 2020-11-11 ENCOUNTER — Other Ambulatory Visit: Payer: Self-pay

## 2020-11-11 VITALS — BP 138/72 | HR 70 | Ht 72.0 in | Wt 278.0 lb

## 2020-11-11 DIAGNOSIS — D494 Neoplasm of unspecified behavior of bladder: Secondary | ICD-10-CM | POA: Diagnosis not present

## 2020-11-11 LAB — URINALYSIS, COMPLETE
Bilirubin, UA: NEGATIVE
Glucose, UA: NEGATIVE
Ketones, UA: NEGATIVE
Leukocytes,UA: NEGATIVE
Nitrite, UA: NEGATIVE
Protein,UA: NEGATIVE
Specific Gravity, UA: 1.025 (ref 1.005–1.030)
Urobilinogen, Ur: 2 mg/dL — ABNORMAL HIGH (ref 0.2–1.0)
pH, UA: 6 (ref 5.0–7.5)

## 2020-11-11 LAB — MICROSCOPIC EXAMINATION
Bacteria, UA: NONE SEEN
Epithelial Cells (non renal): NONE SEEN /hpf (ref 0–10)

## 2020-11-11 NOTE — Progress Notes (Signed)
   11/11/20  CC:  Chief Complaint  Patient presents with  . Cysto   Indications: -TURBT August 2019 papillarybladdertumor -High-grade urothelial carcinomaTa - Induction BCG completed November 2019 -TURBT 10/13/2019 10 mm papillary lesion right bladder neck, received post resection gemcitabine -Pathology Ta urothelial carcinoma (low-grade)  HPI: Presents for surveillance cystoscopy.  He has no complaints  There were no vitals taken for this visit. NED. A&Ox3.   No respiratory distress   Abd soft, NT, ND Normal phallus with bilateral descended testicles  Cystoscopy Procedure Note  Patient identification was confirmed, informed consent was obtained, and patient was prepped using Betadine solution.  Lidocaine jelly was administered per urethral meatus.     Pre-Procedure: - Inspection reveals a normal caliber urethral meatus.  Procedure: The flexible cystoscope was introduced without difficulty - No urethral strictures/lesions are present. - Mild lateral lobe enlargement prostate  - Small median lobe  - Bilateral ureteral orifices identified - Bladder mucosa  reveals ~ 5 mm papillary tumor junction mid posterior wall/dome - No bladder stones -Mild trabeculation  Retroflexion shows no abnormalities   Post-Procedure: - Patient tolerated the procedure well  Assessment/ Plan:  Small recurrent bladder tumor  Schedule cystoscopy with biopsy/fulguration  Post resection gemcitabine instillation  The procedure was discussed including potential risks of bleeding, infection and bladder injury.  All questions were answered and he desires to proceed.   Abbie Sons, MD

## 2020-11-30 ENCOUNTER — Other Ambulatory Visit: Payer: Self-pay | Admitting: Urology

## 2020-11-30 DIAGNOSIS — D494 Neoplasm of unspecified behavior of bladder: Secondary | ICD-10-CM

## 2020-12-14 ENCOUNTER — Other Ambulatory Visit: Payer: Self-pay

## 2020-12-14 DIAGNOSIS — D494 Neoplasm of unspecified behavior of bladder: Secondary | ICD-10-CM

## 2020-12-16 ENCOUNTER — Other Ambulatory Visit
Admission: RE | Admit: 2020-12-16 | Discharge: 2020-12-16 | Disposition: A | Discharge status: Home / Self Care | Payer: Medicare Other | Source: Ambulatory Visit | Attending: Urology | Admitting: Urology

## 2020-12-16 ENCOUNTER — Other Ambulatory Visit: Payer: Medicare Other

## 2020-12-16 ENCOUNTER — Encounter
Admission: RE | Admit: 2020-12-16 | Discharge: 2020-12-16 | Disposition: A | Discharge status: Home / Self Care | Payer: Medicare Other | Source: Ambulatory Visit | Attending: Urology | Admitting: Urology

## 2020-12-16 ENCOUNTER — Encounter: Payer: Self-pay | Admitting: Emergency Medicine

## 2020-12-16 ENCOUNTER — Ambulatory Visit
Admission: EM | Admit: 2020-12-16 | Discharge: 2020-12-16 | Disposition: A | Discharge status: Home / Self Care | Payer: Medicare Other | Attending: Emergency Medicine | Admitting: Emergency Medicine

## 2020-12-16 ENCOUNTER — Other Ambulatory Visit: Payer: Self-pay

## 2020-12-16 DIAGNOSIS — I4891 Unspecified atrial fibrillation: Secondary | ICD-10-CM

## 2020-12-16 DIAGNOSIS — C679 Malignant neoplasm of bladder, unspecified: Secondary | ICD-10-CM | POA: Diagnosis not present

## 2020-12-16 DIAGNOSIS — D494 Neoplasm of unspecified behavior of bladder: Secondary | ICD-10-CM | POA: Diagnosis not present

## 2020-12-16 DIAGNOSIS — D414 Neoplasm of uncertain behavior of bladder: Secondary | ICD-10-CM | POA: Diagnosis not present

## 2020-12-16 DIAGNOSIS — Z8551 Personal history of malignant neoplasm of bladder: Secondary | ICD-10-CM | POA: Diagnosis not present

## 2020-12-16 DIAGNOSIS — H6691 Otitis media, unspecified, right ear: Secondary | ICD-10-CM | POA: Diagnosis not present

## 2020-12-16 DIAGNOSIS — R310 Gross hematuria: Secondary | ICD-10-CM | POA: Diagnosis not present

## 2020-12-16 DIAGNOSIS — Z01812 Encounter for preprocedural laboratory examination: Secondary | ICD-10-CM | POA: Diagnosis not present

## 2020-12-16 HISTORY — DX: Dyspnea, unspecified: R06.00

## 2020-12-16 HISTORY — DX: Malignant neoplasm of bladder, unspecified: C67.9

## 2020-12-16 HISTORY — DX: Unspecified atrial fibrillation: I48.91

## 2020-12-16 LAB — BASIC METABOLIC PANEL
Anion gap: 9 (ref 5–15)
BUN: 12 mg/dL (ref 8–23)
CO2: 24 mmol/L (ref 22–32)
Calcium: 9 mg/dL (ref 8.9–10.3)
Chloride: 104 mmol/L (ref 98–111)
Creatinine, Ser: 0.89 mg/dL (ref 0.61–1.24)
GFR, Estimated: >60 mL/min (ref >60)
Glucose, Bld: 153 mg/dL — ABNORMAL HIGH (ref 70–99)
Potassium: 3.6 mmol/L (ref 3.5–5.1)
Sodium: 137 mmol/L (ref 135–145)

## 2020-12-16 LAB — CBC
HCT: 46 % (ref 39.0–52.0)
Hemoglobin: 15.8 g/dL (ref 13.0–17.0)
MCH: 29.9 pg (ref 26.0–34.0)
MCHC: 34.3 g/dL (ref 30.0–36.0)
MCV: 87 fL (ref 80.0–100.0)
Platelets: 232 10*3/uL (ref 150–400)
RBC: 5.29 MIL/uL (ref 4.22–5.81)
RDW: 12.5 % (ref 11.5–15.5)
WBC: 9.1 10*3/uL (ref 4.0–10.5)
nRBC: 0 % (ref 0.0–0.2)

## 2020-12-16 MED ORDER — AMOXICILLIN 875 MG PO TABS: 875.0000 mg | ORAL_TABLET | Freq: Two times a day (BID) | ORAL | 0 refills | Status: AC

## 2020-12-16 NOTE — ED Triage Notes (Signed)
Patient c/o RT ear pain x 1 day.   Patient denies fever, nasal congestion, hearing changes, headache, and sore throat.  Patient has a history of clogged ears and infections. Patient reports small eustachian tube diagnosed by ENT.   Patient has taken loratadine with no relief of symptoms.

## 2020-12-16 NOTE — ED Provider Notes (Signed)
Francis Jones    CSN: [id number] Arrival date & time: 12/16/20  1504      History   Chief Complaint Chief Complaint  Patient presents with   Otalgia    HPI MARTICE Jones is a 70 y.o. male.  Patient presents with 1 day history of acute right ear pain.  He denies fever, chills, ear drainage, sore throat, cough, shortness of breath, or other symptoms.  OTC treatment attempted at home.  Patient states he has a history of frequent ear infections.  His medical history also includes bladder cancer, allergic rhinitis, obstructive apnea.  The history is provided by the patient and medical records.   Past Medical History:  Diagnosis Date   Bladder cancer [facility])    Dyspnea    Medical history non-contributory    PONV (postoperative nausea and vomiting)     Patient Active Problem List   Diagnosis Date Noted   Allergic rhinitis 12/30/2015   HLD (hyperlipidemia) 12/30/2015   Obstructive apnea 12/30/2015    Past Surgical History:  Procedure Laterality Date   APPENDECTOMY  1973   CYSTOSCOPY WITH STENT PLACEMENT Right 02/04/2018   Procedure: CYSTOSCOPY;  Surgeon: Abbie Sons, MD;  Location: [facility] ORS;  Service: Urology;  Laterality: Right;   FINGER SURGERY Left 2001   titanium in left 5th finger   HERNIA REPAIR  2000   SHOULDER SURGERY Left 2009   TRANSURETHRAL RESECTION OF BLADDER TUMOR N/A 02/04/2018   Procedure: TRANSURETHRAL RESECTION OF BLADDER TUMOR (TURBT) with gemcitabine;  Surgeon: Abbie Sons, MD;  Location: [facility] ORS;  Service: Urology;  Laterality: N/A;   TRANSURETHRAL RESECTION OF BLADDER TUMOR N/A 10/13/2019   Procedure: TRANSURETHRAL RESECTION OF BLADDER TUMOR (TURBT) with gemcitabine;  Surgeon: Abbie Sons, MD;  Location: [facility] ORS;  Service: Urology;  Laterality: N/A;   VASECTOMY  1987       Home Medications    Prior to Admission medications   Medication Sig Start Date End Date Taking? Authorizing Provider  amoxicillin (AMOXIL) 875 MG tablet  Take 1 tablet (875 mg total) by mouth 2 (two) times daily for 10 days. 12/16/20 12/26/20 Yes Sharion Balloon, NP  Carboxymethylcellul-Glycerin (REFRESH OPTIVE OP) Place 1 drop into both eyes daily as needed (dryness).   Yes [provider]  cholecalciferol (VITAMIN D3) 25 MCG (1000 UNIT) tablet Take 1,000 Units by mouth daily.   Yes [provider]  loratadine (CLARITIN) 10 MG tablet Take 10 mg by mouth daily.   Yes [provider]  MULTIPLE VITAMIN PO Take 1 tablet by mouth daily.    Yes [provider]  Omega-3 Fatty Acids (FISH OIL) 1200 MG CAPS Take 2,400 mg by mouth daily.   Yes [provider]  SUPER B COMPLEX/C CAPS Take 1 capsule by mouth daily.   Yes [provider]  acetaminophen (TYLENOL) 650 MG CR tablet Take 650-1,300 mg by mouth every 8 (eight) hours as needed for pain.    [provider]  ibuprofen (ADVIL,MOTRIN) 200 MG tablet Take 200-400 mg by mouth daily as needed for moderate pain.    [provider]    Family History History reviewed. No pertinent family history.  Social History Social History   Tobacco Use   Smoking status: Former    Pack years: 0.00    Types: Cigarettes    Quit date: 1979    Years since quitting: 43.5   Smokeless tobacco: Never  Vaping Use   Vaping Use: Never used  Substance Use Topics   Alcohol use: Yes    Alcohol/week: 2.0 standard drinks    Types: 1 Glasses of wine, 1 Cans of beer per week    Comment: occasional   Drug use: Not Currently    Types: LSD, Opium    Comment: as a teenager     Allergies   Hydrocodone-acetaminophen and Oxycodone   Review of Systems Review of Systems  Constitutional:  Negative for chills and fever.  HENT:  Positive for ear pain. Negative for ear discharge and sore throat.   Respiratory:  Negative for cough and shortness of breath.   Cardiovascular:  Negative for chest pain and palpitations.  Skin:  Negative for color change and rash.   All other systems reviewed and are negative.   Physical Exam Triage Vital Signs ED Triage Vitals  Enc Vitals Group     BP      Pulse      Resp      Temp      Temp src      SpO2      Weight      Height      Head Circumference      Peak Flow      Pain Score      Pain Loc      Pain Edu?      Excl. in [place]?    No data found.  Updated Vital Signs BP 111/73 (BP Location: Left Arm)   Pulse (!) 105   Temp 98.8 F (37.1 C) (Oral)   Resp 15   SpO2 95%   Visual Acuity Right Eye Distance:   Left Eye Distance:   Bilateral Distance:    Right Eye Near:   Left Eye Near:    Bilateral Near:     Physical Exam Vitals and nursing note reviewed.  Constitutional:      General: He is not in acute distress.    Appearance: He is well-developed.  HENT:     Head: Normocephalic and atraumatic.     Right Ear: Ear canal normal. Tympanic membrane is erythematous and bulging.     Left Ear: Tympanic membrane and ear canal normal.     Nose: Nose normal.     Mouth/Throat:     Mouth: Mucous membranes are moist.     Pharynx: Oropharynx is clear.  Eyes:     Conjunctiva/sclera: Conjunctivae normal.  Cardiovascular:     Rate and Rhythm: Normal rate and regular rhythm.     Heart sounds: Normal heart sounds.  Pulmonary:     Effort: Pulmonary effort is normal. No respiratory distress.     Breath sounds: Normal breath sounds.  Abdominal:     Palpations: Abdomen is soft.     Tenderness: There is no abdominal tenderness.  Musculoskeletal:     Cervical back: Neck supple.  Skin:    General: Skin is warm and dry.  Neurological:     General: No focal deficit present.     Mental Status: He is alert and oriented to person, place, and time.     Gait: Gait normal.  Psychiatric:        Mood and Affect: Mood normal.        Behavior: Behavior normal.     UC Treatments / Results  Labs (all labs ordered are listed, but only abnormal results are displayed) Labs Reviewed - No data to  display  EKG   Radiology No results found.  Procedures Procedures (including critical  care time)  Medications Ordered in UC Medications - No data to display  Initial Impression / Assessment and Plan / UC Course  I have reviewed the triage vital signs and the nursing notes.  Pertinent labs & imaging results that were available during my care of the patient were reviewed by me and considered in my medical decision making (see chart for details).  Right otitis media.  Treating with amoxicillin.  Discussed OTC treatment for discomfort.  Instructed patient to follow-up with his PCP if his symptoms are not improving.  He agrees to plan of care.   Final Clinical Impressions(s) / UC Diagnoses   Final diagnoses:  Right otitis media, unspecified otitis media type     Discharge Instructions      Take the amoxicillin as directed.    Follow up with your primary care provider if your symptoms are not improving.         ED Prescriptions     Medication Sig Dispense Auth. Provider   amoxicillin (AMOXIL) 875 MG tablet Take 1 tablet (875 mg total) by mouth 2 (two) times daily for 10 days. 20 tablet Sharion Balloon, NP      PDMP not reviewed this encounter.   Sharion Balloon, NP 12/16/20 [phone]

## 2020-12-16 NOTE — Patient Instructions (Addendum)
Your procedure is scheduled on: Tuesday December 27, 2020. Report to Day Surgery inside [facility] 2nd floor (stop by admissions desk first before getting on elevator). To find out your arrival time please call [phone] between 1PM - 3PM on Monday December 26, 2020.  Remember: Instructions that are not followed completely may result in serious medical risk,  up to and including death, or upon the discretion of your surgeon and anesthesiologist your  surgery may need to be rescheduled.     _X__ 1. Do not eat food or drink fluids after midnight the night before your procedure.                 No chewing gum or hard candies.   __X__2.  On the morning of surgery brush your teeth with toothpaste and water, you                may rinse your mouth with mouthwash if you wish.  Do not swallow any toothpaste of mouthwash.     _X__ 3.  No Alcohol for 24 hours before or after surgery.   _X__ 4.  Do Not Smoke or use e-cigarettes For 24 Hours Prior to Your Surgery.                 Do not use any chewable tobacco products for at least 6 hours prior to                 Surgery.  _X__  5.  Do not use any recreational drugs (marijuana, cocaine, heroin, ecstasy, MDMA or other)                For at least one week prior to your surgery.  Combination of these drugs with anesthesia                May have life threatening results.  __X__ 6.  Notify your doctor if there is any change in your medical condition      (cold, fever, infections).     Do not wear jewelry, make-up, hairpins, clips or nail polish. Do not wear lotions, powders, or perfumes. You may wear deodorant. Do not shave 48 hours prior to surgery. Men may shave face and neck. Do not bring valuables to the hospital.    [facility] is not responsible for any belongings or valuables.  Contacts, dentures or bridgework may not be worn into surgery. Leave your suitcase in the car. After surgery it may be brought to your  room. For patients admitted to the hospital, discharge time is determined by your treatment team.   Patients discharged the day of surgery will not be allowed to drive home.   Make arrangements for someone to be with you for the first 24 hours of your Same Day Discharge.  __X__ Take these medicines the morning of surgery with A SIP OF WATER:    1. None   2.   3.   4.  5.  6.  ____ Fleet Enema (as directed)   ____ Use CHG Soap (or wipes) as directed  ____ Use Benzoyl Peroxide Gel as instructed  ____ Use inhalers on the day of surgery  ____ Stop metformin 2 days prior to surgery    ____ Take 1/2 of usual insulin dose the night before surgery. No insulin the morning          of surgery.   ____ Call your PCP, cardiologist, or Pulmonologist if taking Coumadin/Plavix/aspirin and ask  when to stop before your surgery.   __X__ One Week prior to surgery- Stop Anti-inflammatories such as Ibuprofen, Aleve, Advil, Motrin, meloxicam (MOBIC), diclofenac, etodolac, ketorolac, Toradol, Daypro, piroxicam, Goody's or BC powders. OK TO USE TYLENOL IF NEEDED   __X__ One week prior to Surgery- Stop supplements until after surgery.    ____ Bring C-Pap to the hospital.    If you have any questions regarding your pre-procedure instructions,  Please call Pre-admit Testing at [phone].

## 2020-12-16 NOTE — Progress Notes (Signed)
  Perioperative Services Pre-Admission/Anesthesia Testing    Date: 12/16/20  Name: Francis Jones MRN:   [id number]  Re: Acute ECG changes and need for preoperative cardiovascular evaluation and clearance   Case: [id number] Date/Time: 12/27/20 9381   Procedures:      CYSTOSCOPY WITH BLADDER BIOPSY     CYSTOSCOPY WITH FULGURATION   Anesthesia type: Choice   Pre-op diagnosis: Bladder tumor   Location: [facility] OR ROOM 10 / [place] [facility]S FOR ANESTHESIA GROUP   Surgeons: Abbie Sons, MD   Patient is scheduled for the above procedure on 12/27/2020 with Dr. John Giovanni.  In preparation for his procedure, patient presented to the PAT clinic on the afternoon of 12/16/2020.  Preoperative ECG revealed new onset atrial fibrillation with RVR at a rate of 110 bpm.    Patient denies a history of cardiac diagnoses, and therefore, does not have a cardiologist.   In clinic, patient denies chest pain. He reports intermittent DOE, however attributes this to physical deconditioning.   In review of pre-anesthesia review performed in 12/2013 at [facility], patient reported, "he does get chest pain with strenuous and prolonged cardiac activity. Running up 3 flights of stairs, shoveling dirt into truck for over 30 min. Has been evaluated and told to "lose weight".  Pertinent Diagnostics:    Impression and Plan:  Patient presents for preoperative testing and was found to be in new onset atrial fibrillation with RVR. This has been communicated to the patient and questions fielded by the PAT staff. Currently he is asymptomatic for the most part, although he does endorse some (+) DOE. Patient referred to cardiology for preoperative evaluation and clearance. [facility] graciously able to offer an expedited appointment on 12/19/2020 at 0900 AM with Dr. Kate Sable, MD. He was advised that should develop CP, SOB, dizziness, experience presyncope/syncope, or any other concerning symptoms over the weekend, he  should proceed immediately to the ER for urgent/immediate evaluation.   Based on the timing of any required diagnostics (anticipate TTE), we may have to postpone patient's procedure with urology, however with the expedited appointment, this is hopefully less likely. Will defer any changes to the surgical schedule at this time pending cardiology input. Will forward copy of this note to primary attending surgeon Bernardo Heater, MD) to make him aware of acute findings and need for further evaluation.   Orders Placed This Encounter  Procedures   Ambulatory referral to Cardiology    Referral Priority:   Routine    Referral Type:   Consultation    Referral Reason:   Specialty Services Required    Requested Specialty:   Cardiology    Number of Visits Requested:   [place], MSN, APRN, FNP-C, [place]  Peri-operative Services Nurse Practitioner Phone: [phone] 12/16/20 2:37 PM  NOTE: This note has been prepared using Dragon dictation software. Despite my best ability to proofread, there is always the potential that unintentional transcriptional errors may still occur from this process

## 2020-12-16 NOTE — Discharge Instructions (Addendum)
Take the amoxicillin as directed.  Follow up with your primary care provider if your symptoms are not improving.   ° ° °

## 2020-12-19 ENCOUNTER — Ambulatory Visit (INDEPENDENT_AMBULATORY_CARE_PROVIDER_SITE_OTHER): Payer: Medicare Other | Admitting: Cardiology

## 2020-12-19 ENCOUNTER — Other Ambulatory Visit: Payer: Self-pay

## 2020-12-19 ENCOUNTER — Encounter: Payer: Self-pay | Admitting: Cardiology

## 2020-12-19 ENCOUNTER — Encounter: Payer: Self-pay | Admitting: Urology

## 2020-12-19 VITALS — BP 120/80 | HR 99 | Ht 72.0 in | Wt 275.0 lb

## 2020-12-19 DIAGNOSIS — Z01818 Encounter for other preprocedural examination: Secondary | ICD-10-CM

## 2020-12-19 DIAGNOSIS — R0600 Dyspnea, unspecified: Secondary | ICD-10-CM | POA: Diagnosis not present

## 2020-12-19 DIAGNOSIS — I4891 Unspecified atrial fibrillation: Secondary | ICD-10-CM | POA: Diagnosis not present

## 2020-12-19 DIAGNOSIS — Z01810 Encounter for preprocedural cardiovascular examination: Secondary | ICD-10-CM

## 2020-12-19 DIAGNOSIS — R0609 Other forms of dyspnea: Secondary | ICD-10-CM

## 2020-12-19 LAB — CULTURE, URINE COMPREHENSIVE

## 2020-12-19 MED ORDER — METOPROLOL SUCCINATE ER 25 MG PO TB24: 25.0000 mg | ORAL_TABLET | Freq: Every day | ORAL | 5 refills | Status: DC

## 2020-12-19 NOTE — Patient Instructions (Addendum)
Medication Instructions:   Your physician has recommended you make the following change in your medication:    START taking Toprol-XL (Metoprolol Succinate) 25 mg daily.   *If you need a refill on your cardiac medications before your next appointment, please call your pharmacy*   Lab Work: None ordered If you have labs (blood work) drawn today and your tests are completely normal, you will receive your results only by: [place] (if you have MyChart) OR A paper copy in the mail If you have any lab test that is abnormal or we need to change your treatment, we will call you to review the results.   Testing/Procedures:  Your physician has requested that you have an echocardiogram. Echocardiography is a painless test that uses sound waves to create images of your heart. It provides your doctor with information about the size and shape of your heart and how well your heart's chambers and valves are working. This procedure takes approximately one hour. There are no restrictions for this procedure.    Follow-Up: At [facility], you and your health needs are our priority.  As part of our continuing mission to provide you with exceptional heart care, we have created designated Provider Care Teams.  These Care Teams include your primary Cardiologist (physician) and Advanced Practice Providers (APPs -  Physician Assistants and Nurse Practitioners) who all work together to provide you with the care you need, when you need it.  We recommend signing up for the patient portal called "MyChart".  Sign up information is provided on this After Visit Summary.  MyChart is used to connect with patients for Virtual Visits (Telemedicine).  Patients are able to view lab/test results, encounter notes, upcoming appointments, etc.  Non-urgent messages can be sent to your provider as well.   To learn more about what you can do with MyChart, go to [email or web address].    Your next appointment:   1  month(s)  The format for your next appointment:   In Person  Provider:   Kate Sable, MD   Other Instructions

## 2020-12-19 NOTE — Progress Notes (Signed)
Perioperative Services Pre-Admission/Anesthesia Testing    Date: 12/19/20  Name: Francis Jones MRN:   [id number]  Re: Presurgical cardiac clearance   Case: [id number] Date/Time: 12/27/20 4008   Procedures:      CYSTOSCOPY WITH BLADDER BIOPSY     CYSTOSCOPY WITH FULGERATION   Anesthesia type: Choice   Pre-op diagnosis: Bladder tumor   Location: [facility] OR ROOM 10 / [place] [facility] FOR [facility]   Surgeons: Abbie Sons, MD     Patient scheduled for the above procedure on 12/27/2020 with Dr. John Giovanni.  In preparation for his procedure, patient presented for PAT testing where he was found to be in new onset atrial fibrillation with RVR.  Patient was referred to cardiology for further evaluation and preoperative clearance.  Patient was seen in consult on 12/19/2020 by Dr. Kate Sable, MD; notes reviewed.  At the time of his clinic visit, patient denied any episodes of chest pain or palpitation.  He continued to report DOE which he attributed to physical deconditioning.  Patient with no cardiac history.  He reported that he snores at night, however he had a sleep study years ago that was (-) for OSAH.  Repeat ECG in the office revealed persistent atrial fibrillation at a rate of 99 bpm. CHA2DS2-VASc Score = 1. Blood pressure well controlled in the office at 120/80.  Decision was made to pursue further noninvasive cardiac work-up via TTE; procedure scheduled for 01/10/2021 at 1600 PM.  Patient was started on metoprolol succinate 25 mg.  Deferring initiation of anticoagulation until after TTE results have been reviewed and urological procedure performed. Patient to follow-up with outpatient cardiology on 01/26/2021 to discuss episodes of echocardiogram.  Based on results of TTE, cardiologist noted that DCCV procedure may be considered.   Additionally, cardiology recommending repeat PSG to assess for OSAH given his new onset atrial fibrillation.   With that being said, per cardiology,  "urological procedures deemed LOW risk from a cardiac perspective.  Okay to proceed with urological procedure at this time.  Manage atrial fibrillation heart rate control with the prescribed beta-blocker.  May consider anticoagulation following procedure.  Patient may proceed with surgery prior to ordered echocardiogram".  Impression and Plan:  WITTEN CERTAIN has been referred for pre-anesthesia review and clearance prior to him undergoing the planned anesthetic and procedural courses. Available labs, pertinent testing, and imaging results were personally reviewed by me. This patient has been appropriately cleared by cardiology with an overall LOW risk of significant perioperative cardiovascular complications.  Based on clinical review performed today (12/19/20), barring any significant acute changes in the patient's overall condition, it is anticipated that he will be able to proceed with the planned surgical intervention. Any acute changes in clinical condition may necessitate his procedure being postponed and/or cancelled. Patient will meet with anesthesia team (MD and/or CRNA) on this day of his procedure for preoperative evaluation/assessment.   Pre-surgical instructions were reviewed with the patient during his PAT appointment and questions were fielded by PAT clinical staff. Patient was advised that if any questions or concerns arise prior to his procedure then he should return a call to PAT and/or his surgeon's office to discuss.  Honor Loh, MSN, APRN, FNP-C, CEN [facility]  Peri-operative Services Nurse Practitioner Phone: [phone] 12/19/20 1:01 PM  NOTE: This note has been prepared using Dragon dictation software. Despite my best ability to proofread, there is always the potential that unintentional transcriptional errors may still occur from this process.  Honor Loh, MSN, APRN, FNP-C, CEN [facility]  Peri-operative Services Nurse  Practitioner Phone: [phone] 12/19/20 12:52 PM

## 2020-12-19 NOTE — Progress Notes (Signed)
Cardiology Office Note:    Date:  12/19/2020   ID:  Francis Jones, DOB 08-05-1950, MRN [id number]  PCP:  Jerrol Banana., MD   [facility] Providers Cardiologist:  Kate Sable, MD     Referring MD: Karen Kitchens, NP   Chief Complaint  Patient presents with   New Patient (Initial Visit)    Pre op clearance ( 12/27/2020) - New onset afib. Meds reviewed verbally with patient.     History of Present Illness:    Francis Jones is a 70 y.o. male with a hx of bladder cancer who presents due to new onset atrial fibrillation.  Patient with history of bladder cancer, cystoscopy is being planned next week.  Preop EKG on 12/16/2020 showed new onset A. fib with RVR, rate 110.  He denies chest pain or palpitations.  States having shortness of breath with exertion which he attributes to deconditioning.  Endorsed snoring, had a sleep study performed years ago which was normal.  Denies smoking.  Denies any history of heart disease.  Denies edema.  Has radicular pain in his legs, plans to follow-up with PCP regarding this.  Past Medical History:  Diagnosis Date   Bladder cancer ([place])    Dyspnea    Medical history non-contributory    PONV (postoperative nausea and vomiting)     Past Surgical History:  Procedure Laterality Date   APPENDECTOMY  1973   CYSTOSCOPY WITH STENT PLACEMENT Right 02/04/2018   Procedure: CYSTOSCOPY;  Surgeon: Abbie Sons, MD;  Location: [facility] ORS;  Service: Urology;  Laterality: Right;   FINGER SURGERY Left 2001   titanium in left 5th finger   HERNIA REPAIR  2000   SHOULDER SURGERY Left 2009   TRANSURETHRAL RESECTION OF BLADDER TUMOR N/A 02/04/2018   Procedure: TRANSURETHRAL RESECTION OF BLADDER TUMOR (TURBT) with gemcitabine;  Surgeon: Abbie Sons, MD;  Location: [facility] ORS;  Service: Urology;  Laterality: N/A;   TRANSURETHRAL RESECTION OF BLADDER TUMOR N/A 10/13/2019   Procedure: TRANSURETHRAL RESECTION OF BLADDER TUMOR (TURBT) with gemcitabine;   Surgeon: Abbie Sons, MD;  Location: [facility] ORS;  Service: Urology;  Laterality: N/A;   VASECTOMY  1987    Current Medications: Current Meds  Medication Sig   acetaminophen (TYLENOL) 650 MG CR tablet Take 650-1,300 mg by mouth every 8 (eight) hours as needed for pain.   amoxicillin (AMOXIL) 875 MG tablet Take 1 tablet (875 mg total) by mouth 2 (two) times daily for 10 days.   Carboxymethylcellul-Glycerin (REFRESH OPTIVE OP) Place 1 drop into both eyes daily as needed (dryness).   cholecalciferol (VITAMIN D3) 25 MCG (1000 UNIT) tablet Take 1,000 Units by mouth daily.   ibuprofen (ADVIL,MOTRIN) 200 MG tablet Take 200-400 mg by mouth daily as needed for moderate pain.   loratadine (CLARITIN) 10 MG tablet Take 10 mg by mouth daily.   metoprolol succinate (TOPROL XL) 25 MG 24 hr tablet Take 1 tablet (25 mg total) by mouth daily.   MULTIPLE VITAMIN PO Take 1 tablet by mouth daily.    Omega-3 Fatty Acids (FISH OIL) 1200 MG CAPS Take 2,400 mg by mouth daily.   SUPER B COMPLEX/C CAPS Take 1 capsule by mouth daily.     Allergies:   Hydrocodone-acetaminophen and Oxycodone   Social History   Socioeconomic History   Marital status: Married    Spouse name: Pricilla Holm   Number of children: Not on file   Years of education: Not on file  Highest education level: Not on file  Occupational History   Occupation: retired Engineer, drilling  Tobacco Use   Smoking status: Former    Pack years: 0.00    Types: Cigarettes    Quit date: 1979    Years since quitting: 43.5   Smokeless tobacco: Never  Vaping Use   Vaping Use: Never used  Substance and Sexual Activity   Alcohol use: Yes    Alcohol/week: 2.0 standard drinks    Types: 1 Glasses of wine, 1 Cans of beer per week    Comment: occasional   Drug use: Not Currently    Types: LSD, Opium    Comment: as a teenager   Sexual activity: Yes    Birth control/protection: None  Other Topics Concern   Not on file  Social History Narrative    Pt lives with wife   Social Determinants of Health   Financial Resource Strain: Not on file  Food Insecurity: Not on file  Transportation Needs: Not on file  Physical Activity: Not on file  Stress: Not on file  Social Connections: Not on file     Family History: The patient's family history is not on file.  ROS:   Please see the history of present illness.     All other systems reviewed and are negative.  EKGs/Labs/Other Studies Reviewed:    The following studies were reviewed today:   EKG:  EKG is  ordered today.  The ekg ordered today demonstrates atrial fibrillation, heart rate 99  Recent Labs: 12/16/2020: BUN 12; Creatinine, Ser 0.89; Hemoglobin 15.8; Platelets 232; Potassium 3.6; Sodium 137  Recent Lipid Panel No results found for: CHOL, TRIG, HDL, CHOLHDL, VLDL, LDLCALC, LDLDIRECT   Risk Assessment/Calculations:          Physical Exam:    VS:  BP 120/80 (BP Location: Left Arm, Patient Position: Sitting, Cuff Size: Normal)   Pulse 99   Ht 6' (1.829 m)   Wt 275 lb (124.7 kg)   SpO2 96%   BMI 37.30 kg/m     Wt Readings from Last 3 Encounters:  12/19/20 275 lb (124.7 kg)  12/16/20 272 lb (123.4 kg)  11/11/20 278 lb (126.1 kg)     GEN:  Well nourished, well developed in no acute distress HEENT: Normal NECK: No JVD; No carotid bruits LYMPHATICS: No lymphadenopathy CARDIAC: Irregular irregular, no murmurs, rubs, gallops RESPIRATORY:  Clear to auscultation without rales, wheezing or rhonchi  ABDOMEN: Soft, non-tender, non-distended MUSCULOSKELETAL:  No edema; No deformity  SKIN: Warm and dry NEUROLOGIC:  Alert and oriented x 3 PSYCHIATRIC:  Normal affect   ASSESSMENT:    1. Atrial fibrillation, unspecified type ([place])   2. Pre-op evaluation   3. Dyspnea on exertion    PLAN:    In order of problems listed above:  New onset persistent atrial fibrillation, heart rate 99.  CHA2DS2-VASc of 1.  Start Toprol-XL 25 mg daily, get echocardiogram.  Procedure  being planned next week, we will not anticoagulate at this time.  Based on echo, may consider DC cardioversion if no additional procedures are being planned.  Patient will be placed on anticoagulation prior to DC cardioversion. Preop eval, cystoscopy.  Urological procedures deemed low risk from a cardiac perspective.  Okay to proceed with urological procedure.  Manage A. fib heart rate control as above.  May consider anticoagulation after procedure. Dyspnea on exertion, likely from deconditioning.  Get echo.  We will, in light of A. fib, snoring, obesity will consider sleep  study.  Follow-up after echo.      Medication Adjustments/Labs and Tests Ordered: Current medicines are reviewed at length with the patient today.  Concerns regarding medicines are outlined above.  Orders Placed This Encounter  Procedures   EKG 12-Lead   ECHOCARDIOGRAM COMPLETE    Meds ordered this encounter  Medications   metoprolol succinate (TOPROL XL) 25 MG 24 hr tablet    Sig: Take 1 tablet (25 mg total) by mouth daily.    Dispense:  30 tablet    Refill:  5     Patient Instructions  Medication Instructions:   Your physician has recommended you make the following change in your medication:    START taking Toprol-XL (Metoprolol Succinate) 25 mg daily.   *If you need a refill on your cardiac medications before your next appointment, please call your pharmacy*   Lab Work: None ordered If you have labs (blood work) drawn today and your tests are completely normal, you will receive your results only by: [place] (if you have MyChart) OR A paper copy in the mail If you have any lab test that is abnormal or we need to change your treatment, we will call you to review the results.   Testing/Procedures:  Your physician has requested that you have an echocardiogram. Echocardiography is a painless test that uses sound waves to create images of your heart. It provides your doctor with information  about the size and shape of your heart and how well your heart's chambers and valves are working. This procedure takes approximately one hour. There are no restrictions for this procedure.    Follow-Up: At [facility], you and your health needs are our priority.  As part of our continuing mission to provide you with exceptional heart care, we have created designated Provider Care Teams.  These Care Teams include your primary Cardiologist (physician) and Advanced Practice Providers (APPs -  Physician Assistants and Nurse Practitioners) who all work together to provide you with the care you need, when you need it.  We recommend signing up for the patient portal called "MyChart".  Sign up information is provided on this After Visit Summary.  MyChart is used to connect with patients for Virtual Visits (Telemedicine).  Patients are able to view lab/test results, encounter notes, upcoming appointments, etc.  Non-urgent messages can be sent to your provider as well.   To learn more about what you can do with MyChart, go to [email or web address].    Your next appointment:   1 month(s)  The format for your next appointment:   In Person  Provider:   Kate Sable, MD   Other Instructions    Signed, Kate Sable, MD  12/19/2020 12:27 PM    [place]

## 2020-12-26 MED ORDER — LACTATED RINGERS IV SOLN: INTRAVENOUS | Status: DC

## 2020-12-26 MED ORDER — CEFAZOLIN SODIUM-DEXTROSE 2-4 GM/100ML-% IV SOLN
2.0000 g | INTRAVENOUS | Status: AC
  Administered 2020-12-27: 2 g via INTRAVENOUS

## 2020-12-26 MED ORDER — APREPITANT 40 MG PO CAPS: 40.0000 mg | ORAL_CAPSULE | Freq: Once | ORAL | Status: AC

## 2020-12-26 MED ORDER — CHLORHEXIDINE GLUCONATE 0.12 % MT SOLN: 15.0000 mL | Freq: Once | OROMUCOSAL | Status: AC

## 2020-12-26 MED ORDER — ORAL CARE MOUTH RINSE: 15.0000 mL | Freq: Once | OROMUCOSAL | Status: AC

## 2020-12-26 MED ORDER — FAMOTIDINE 20 MG PO TABS: 20.0000 mg | ORAL_TABLET | Freq: Once | ORAL | Status: AC

## 2020-12-27 ENCOUNTER — Ambulatory Visit: Payer: Medicare Other | Admitting: Urgent Care

## 2020-12-27 ENCOUNTER — Ambulatory Visit
Admission: RE | Admit: 2020-12-27 | Discharge: 2020-12-27 | Disposition: A | Discharge status: Home / Self Care | Payer: Medicare Other | Attending: Urology | Admitting: Urology

## 2020-12-27 ENCOUNTER — Encounter: Payer: Self-pay | Admitting: Urology

## 2020-12-27 ENCOUNTER — Other Ambulatory Visit: Payer: Self-pay

## 2020-12-27 ENCOUNTER — Encounter
Admission: RE | Disposition: A | Discharge status: Home / Self Care | Payer: Self-pay | Source: Home / Self Care | Attending: Urology

## 2020-12-27 DIAGNOSIS — Z9049 Acquired absence of other specified parts of digestive tract: Secondary | ICD-10-CM | POA: Diagnosis not present

## 2020-12-27 DIAGNOSIS — Z885 Allergy status to narcotic agent status: Secondary | ICD-10-CM | POA: Diagnosis not present

## 2020-12-27 DIAGNOSIS — Z01810 Encounter for preprocedural cardiovascular examination: Secondary | ICD-10-CM | POA: Diagnosis not present

## 2020-12-27 DIAGNOSIS — D494 Neoplasm of unspecified behavior of bladder: Secondary | ICD-10-CM | POA: Diagnosis not present

## 2020-12-27 DIAGNOSIS — C679 Malignant neoplasm of bladder, unspecified: Secondary | ICD-10-CM | POA: Diagnosis not present

## 2020-12-27 DIAGNOSIS — I4891 Unspecified atrial fibrillation: Secondary | ICD-10-CM | POA: Diagnosis not present

## 2020-12-27 DIAGNOSIS — Z9852 Vasectomy status: Secondary | ICD-10-CM | POA: Insufficient documentation

## 2020-12-27 DIAGNOSIS — C671 Malignant neoplasm of dome of bladder: Secondary | ICD-10-CM | POA: Diagnosis not present

## 2020-12-27 DIAGNOSIS — Z87891 Personal history of nicotine dependence: Secondary | ICD-10-CM | POA: Diagnosis not present

## 2020-12-27 HISTORY — PX: CYSTOSCOPY WITH BIOPSY: SHX5122

## 2020-12-27 HISTORY — PX: CYSTOSCOPY WITH FULGERATION: SHX6638

## 2020-12-27 SURGERY — CYSTOSCOPY, WITH BIOPSY
Anesthesia: General

## 2020-12-27 MED ORDER — DEXAMETHASONE SODIUM PHOSPHATE 10 MG/ML IJ SOLN
INTRAMUSCULAR | Status: AC
  Filled 2020-12-27: qty 1

## 2020-12-27 MED ORDER — FENTANYL CITRATE (PF) 100 MCG/2ML IJ SOLN
INTRAMUSCULAR | Status: DC | PRN
  Administered 2020-12-27: 25 ug via INTRAVENOUS

## 2020-12-27 MED ORDER — LIDOCAINE HCL (PF) 2 % IJ SOLN
INTRAMUSCULAR | Status: AC
  Filled 2020-12-27: qty 5

## 2020-12-27 MED ORDER — PROMETHAZINE HCL 25 MG/ML IJ SOLN: 6.2500 mg | INTRAMUSCULAR | Status: DC | PRN

## 2020-12-27 MED ORDER — CHLORHEXIDINE GLUCONATE 0.12 % MT SOLN
OROMUCOSAL | Status: AC
  Administered 2020-12-27: 15 mL via OROMUCOSAL
  Filled 2020-12-27: qty 15

## 2020-12-27 MED ORDER — OXYCODONE HCL 5 MG/5ML PO SOLN: 5.0000 mg | Freq: Once | ORAL | Status: DC | PRN

## 2020-12-27 MED ORDER — ONDANSETRON HCL 4 MG/2ML IJ SOLN
INTRAMUSCULAR | Status: AC
  Filled 2020-12-27: qty 2

## 2020-12-27 MED ORDER — FAMOTIDINE 20 MG PO TABS
ORAL_TABLET | ORAL | Status: AC
  Administered 2020-12-27: 20 mg via ORAL
  Filled 2020-12-27: qty 1

## 2020-12-27 MED ORDER — OXYCODONE HCL 5 MG PO TABS: 5.0000 mg | ORAL_TABLET | Freq: Once | ORAL | Status: DC | PRN

## 2020-12-27 MED ORDER — PROPOFOL 10 MG/ML IV BOLUS
INTRAVENOUS | Status: AC
  Filled 2020-12-27: qty 20

## 2020-12-27 MED ORDER — PROPOFOL 10 MG/ML IV BOLUS
INTRAVENOUS | Status: DC | PRN
  Administered 2020-12-27: 120 mg via INTRAVENOUS

## 2020-12-27 MED ORDER — FENTANYL CITRATE (PF) 100 MCG/2ML IJ SOLN: 25.0000 ug | INTRAMUSCULAR | Status: DC | PRN

## 2020-12-27 MED ORDER — ONDANSETRON HCL 4 MG/2ML IJ SOLN
INTRAMUSCULAR | Status: DC | PRN
  Administered 2020-12-27: 4 mg via INTRAVENOUS

## 2020-12-27 MED ORDER — LIDOCAINE HCL (CARDIAC) PF 100 MG/5ML IV SOSY
PREFILLED_SYRINGE | INTRAVENOUS | Status: DC | PRN
  Administered 2020-12-27: 100 mg via INTRAVENOUS

## 2020-12-27 MED ORDER — APREPITANT 40 MG PO CAPS
ORAL_CAPSULE | ORAL | Status: AC
  Administered 2020-12-27: 40 mg via ORAL
  Filled 2020-12-27: qty 1

## 2020-12-27 MED ORDER — CEFAZOLIN SODIUM-DEXTROSE 2-4 GM/100ML-% IV SOLN
INTRAVENOUS | Status: AC
  Filled 2020-12-27: qty 100

## 2020-12-27 MED ORDER — FENTANYL CITRATE (PF) 100 MCG/2ML IJ SOLN
INTRAMUSCULAR | Status: AC
  Filled 2020-12-27: qty 2

## 2020-12-27 MED ORDER — SODIUM CHLORIDE 0.9 % IR SOLN
Status: DC | PRN
  Administered 2020-12-27 (×2): 3000 mL

## 2020-12-27 MED ORDER — DEXAMETHASONE SODIUM PHOSPHATE 10 MG/ML IJ SOLN
INTRAMUSCULAR | Status: DC | PRN
  Administered 2020-12-27: 10 mg via INTRAVENOUS

## 2020-12-27 SURGICAL SUPPLY — 27 items
BAG DRAIN CYSTO-URO LG1000N (MISCELLANEOUS) ×2 IMPLANT
BRUSH SCRUB EZ 1% IODOPHOR (MISCELLANEOUS) ×2 IMPLANT
CATH URET FLEX-TIP 2 LUMEN 10F (CATHETERS) ×2 IMPLANT
DRSG TELFA 4X3 1S NADH ST (GAUZE/BANDAGES/DRESSINGS) ×2 IMPLANT
ELECT LOOP 22F BIPOLAR SML (ELECTROSURGICAL) ×2
ELECT REM PT RETURN 9FT ADLT (ELECTROSURGICAL) ×2
ELECTRODE LOOP 22F BIPOLAR SML (ELECTROSURGICAL) ×1 IMPLANT
ELECTRODE REM PT RTRN 9FT ADLT (ELECTROSURGICAL) ×1 IMPLANT
GAUZE 4X4 16PLY ~~LOC~~+RFID DBL (SPONGE) ×4 IMPLANT
GLOVE SURG UNDER POLY LF SZ7.5 (GLOVE) ×2 IMPLANT
GOWN STRL REUS W/ TWL LRG LVL3 (GOWN DISPOSABLE) ×2 IMPLANT
GOWN STRL REUS W/ TWL XL LVL3 (GOWN DISPOSABLE) ×1 IMPLANT
GOWN STRL REUS W/TWL LRG LVL3 (GOWN DISPOSABLE) ×4
GOWN STRL REUS W/TWL XL LVL3 (GOWN DISPOSABLE) ×2
GUIDEWIRE STR DUAL SENSOR (WIRE) ×4 IMPLANT
IV NS IRRIG 3000ML ARTHROMATIC (IV SOLUTION) ×2 IMPLANT
KIT TURNOVER CYSTO (KITS) ×2 IMPLANT
MANIFOLD NEPTUNE II (INSTRUMENTS) ×2 IMPLANT
NDL SAFETY ECLIPSE 18X1.5 (NEEDLE) ×1 IMPLANT
NEEDLE HYPO 18GX1.5 SHARP (NEEDLE) ×2
PACK CYSTO AR (MISCELLANEOUS) ×2 IMPLANT
SET CYSTO W/LG BORE CLAMP LF (SET/KITS/TRAYS/PACK) ×2 IMPLANT
SET IRRIG Y TYPE TUR BLADDER L (SET/KITS/TRAYS/PACK) ×2 IMPLANT
SHEATH URETERAL 12FRX35CM (MISCELLANEOUS) ×2 IMPLANT
SURGILUBE 2OZ TUBE FLIPTOP (MISCELLANEOUS) ×2 IMPLANT
WATER STERILE IRR 1000ML POUR (IV SOLUTION) ×2 IMPLANT
WATER STERILE IRR 3000ML UROMA (IV SOLUTION) ×2 IMPLANT

## 2020-12-27 NOTE — Anesthesia Postprocedure Evaluation (Signed)
Anesthesia Post Note  Patient: Francis Jones  Procedure(s) Performed: CYSTOSCOPY WITH BLADDER BIOPSY CYSTOSCOPY WITH [place]  Patient location during evaluation: PACU Anesthesia Type: General Level of consciousness: combative Pain management: pain level controlled Vital Signs Assessment: post-procedure vital signs reviewed and stable Respiratory status: spontaneous breathing, nonlabored ventilation, respiratory function stable and patient connected to nasal cannula oxygen Cardiovascular status: blood pressure returned to baseline and stable Postop Assessment: no apparent nausea or vomiting Anesthetic complications: no   No notable events documented.   Last Vitals:  Vitals:   12/27/20 0925 12/27/20 0937  BP: 120/71 (!) 122/93  Pulse: 75 84  Resp: 12 16  Temp: (!) 36.2 C (!) 36.2 C  SpO2: 96% 100%    Last Pain:  Vitals:   12/27/20 0937  TempSrc: Temporal  PainSc: 0-No pain                 [place] Ouch

## 2020-12-27 NOTE — Anesthesia Preprocedure Evaluation (Signed)
Anesthesia Evaluation  Patient identified by MRN, date of birth, ID band Patient awake    Reviewed: Allergy & Precautions, NPO status , Patient's Chart, lab work & pertinent test results  History of Anesthesia Complications (+) PONV and history of anesthetic complications  Airway Mallampati: II  TM Distance: >3 FB Neck ROM: full    Dental no notable dental hx. (+) Edentulous Upper, Edentulous Lower   Pulmonary neg pulmonary ROS, neg sleep apnea, neg COPD, former smoker,    Pulmonary exam normal breath sounds clear to auscultation- rhonchi (-) wheezing      Cardiovascular Exercise Tolerance: Good (-) hypertension(-) CAD, (-) Past MI, (-) Cardiac Stents and (-) CABG + dysrhythmias Atrial Fibrillation  Rhythm:Regular Rate:Normal - Systolic murmurs and - Diastolic murmurs    Neuro/Psych neg Seizures negative neurological ROS  negative psych ROS   GI/Hepatic negative GI ROS, Neg liver ROS,   Endo/Other  negative endocrine ROSneg diabetes  Renal/GU negative Renal ROS     Musculoskeletal negative musculoskeletal ROS (+)   Abdominal (+) + obese,   Peds  Hematology negative hematology ROS (+)   Anesthesia Other Findings Past Medical History: 12/16/2020: Atrial fibrillation ([facility]) No date: Bladder cancer ([place]) No date: Dyspnea No date: Medical history non-contributory No date: PONV (postoperative nausea and vomiting)  Past Surgical History: 1973: APPENDECTOMY 02/04/2018: CYSTOSCOPY WITH STENT PLACEMENT; Right     Comment:  Procedure: CYSTOSCOPY;  Surgeon: Abbie Sons, MD;                Location: [facility] ORS;  Service: Urology;  Laterality:               Right; 2001: FINGER SURGERY; Left     Comment:  titanium in left 5th finger 2000: HERNIA REPAIR 2009: SHOULDER SURGERY; Left 02/04/2018: TRANSURETHRAL RESECTION OF BLADDER TUMOR; N/A     Comment:  Procedure: TRANSURETHRAL RESECTION OF BLADDER TUMOR                (TURBT) with gemcitabine;  Surgeon: Abbie Sons, MD;              Location: [facility] ORS;  Service: Urology;  Laterality: N/A; 10/13/2019: TRANSURETHRAL RESECTION OF BLADDER TUMOR; N/A     Comment:  Procedure: TRANSURETHRAL RESECTION OF BLADDER TUMOR               (TURBT) with gemcitabine;  Surgeon: Abbie Sons, MD;              Location: [facility] ORS;  Service: Urology;  Laterality: N/A; 1987: VASECTOMY     Reproductive/Obstetrics negative OB ROS                             Anesthesia Physical  Anesthesia Plan  ASA: 3  Anesthesia Plan: General LMA and General   Post-op Pain Management:    Induction: Intravenous  PONV Risk Score and Plan: 3 and Dexamethasone, Ondansetron and Treatment may vary due to age or medical condition  Airway Management Planned: LMA  Additional Equipment:   Intra-op Plan:   Post-operative Plan:   Informed Consent: I have reviewed the patients History and Physical, chart, labs and discussed the procedure including the risks, benefits and alternatives for the proposed anesthesia with the patient or authorized representative who has indicated his/her understanding and acceptance.     Dental Advisory Given  Plan Discussed with: Anesthesiologist, CRNA and Surgeon  Anesthesia Plan Comments:  Anesthesia Quick Evaluation  

## 2020-12-27 NOTE — Discharge Instructions (Addendum)
Transurethral Resection of Bladder Tumor (TURBT) or Bladder Biopsy   Definition:  Transurethral Resection of the Bladder Tumor is a surgical procedure used to diagnose and remove tumors within the bladder. T  General instructions:     Your recent bladder surgery requires very little post hospital care but some definite precautions.  Despite the fact that no skin incisions were used, the area around the bladder incisions are raw and covered with scabs to promote healing and prevent bleeding. Certain precautions are needed to insure that the scabs are not disturbed over the next 2-4 weeks while the healing proceeds.  Because the raw surface inside your bladder and the irritating effects of urine you may expect frequency of urination and/or urgency (a stronger desire to urinate) and perhaps even getting up at night more often. This will usually resolve or improve slowly over the healing period. You may see some blood in your urine over the first 6 weeks. Do not be alarmed, even if the urine was clear for a while. Get off your feet and drink lots of fluids until clearing occurs. If you start to pass clots or don't improve call us.  Diet:  You may return to your normal diet immediately. Because of the raw surface of your bladder, alcohol, spicy foods, foods high in acid and drinks with caffeine may cause irritation or frequency and should be used in moderation. To keep your urine flowing freely and avoid constipation, drink plenty of fluids during the day (8-10 glasses). Tip: Avoid cranberry juice because it is very acidic.  Activity:  Your physical activity doesn't need to be restricted. However, if you are very active, you may see some blood in the urine. We suggest that you reduce your activity under the circumstances until the bleeding has stopped.  Bowels:  It is important to keep your bowels regular during the postoperative period. Straining with bowel movements can cause bleeding. A bowel  movement every other day is reasonable. Use a mild laxative if needed, such as milk of magnesia 2-3 tablespoons, or 2 Dulcolax tablets. Call if you continue to have problems. If you had been taking narcotics for pain, before, during or after your surgery, you may be constipated. Take a laxative if necessary.    Medication:  You should resume your pre-surgery medications unless told not to. In addition you may be given an antibiotic to prevent or treat infection. Antibiotics are not always necessary. All medication should be taken as prescribed until the bottles are finished unless you are having an unusual reaction to one of the drugs.   [facility] [address] [place], [place] [address] [phone]  AMBULATORY SURGERY  DISCHARGE INSTRUCTIONS   The drugs that you were given will stay in your system until tomorrow so for the next 24 hours you should not:  Drive an automobile Make any legal decisions Drink any alcoholic beverage   You may resume regular meals tomorrow.  Today it is better to start with liquids and gradually work up to solid foods.  You may eat anything you prefer, but it is better to start with liquids, then soup and crackers, and gradually work up to solid foods.   Please notify your doctor immediately if you have any unusual bleeding, trouble breathing, redness and pain at the surgery site, drainage, fever, or pain not relieved by medication.     Your post-operative visit with Dr.  is: Date:                        Time:    Please call to schedule your post-operative visit.  Additional Instructions:

## 2020-12-27 NOTE — Interval H&P Note (Signed)
History and Physical Interval Note:  12/27/2020 8:13 AM  Francis Jones  has presented today for surgery, with the diagnosis of Bladder tumor.  The various methods of treatment have been discussed with the patient and family. After consideration of risks, benefits and other options for treatment, the patient has consented to  Procedure(s): CYSTOSCOPY WITH BLADDER BIOPSY (N/A) CYSTOSCOPY WITH FULGERATION (N/A) as a surgical intervention.  The patient's history has been reviewed, patient examined, no change in status, stable for surgery.  I have reviewed the patient's chart and labs.  Questions were answered to the patient's satisfaction.     [place]

## 2020-12-27 NOTE — Transfer of Care (Signed)
Immediate Anesthesia Transfer of Care Note  Patient: Francis Jones  Procedure(s) Performed: CYSTOSCOPY WITH BLADDER BIOPSY CYSTOSCOPY WITH FULGERATION  Patient Location: PACU  Anesthesia Type:General  Level of Consciousness: drowsy  Airway & Oxygen Therapy: Patient Spontanous Breathing and Patient connected to face mask oxygen  Post-op Assessment: Report given to RN and Post -op Vital signs reviewed and stable  Post vital signs: stable  Last Vitals:  Vitals Value Taken Time  BP 122/85 12/27/20 0854  Temp    Pulse 77 12/27/20 0856  Resp 13 12/27/20 0856  SpO2 98 % 12/27/20 0856  Vitals shown include unvalidated device data.  Last Pain:  Vitals:   12/27/20 0731  PainSc: 0-No pain         Complications: No notable events documented.

## 2020-12-27 NOTE — H&P (Signed)
Urology H&P   History of Present Illness: Francis Jones is a 70 y.o. male who presents for TURBT.  -TURBT August 2019 papillary bladder tumor - High-grade urothelial carcinoma Ta - Induction BCG completed November 2019 -TURBT 10/13/2019 10 mm papillary lesion right bladder neck, received post resection gemcitabine -Pathology Ta urothelial carcinoma (low-grade)  -Recent surveillance cystoscopy with a 5 mm posterior wall papillary lesion  Past Medical History:  Diagnosis Date   Atrial fibrillation ([place]) 12/16/2020   Bladder cancer ([place])    Dyspnea    Medical history non-contributory    PONV (postoperative nausea and vomiting)     Past Surgical History:  Procedure Laterality Date   APPENDECTOMY  1973   CYSTOSCOPY WITH STENT PLACEMENT Right 02/04/2018   Procedure: CYSTOSCOPY;  Surgeon: Abbie Sons, MD;  Location: [facility] ORS;  Service: Urology;  Laterality: Right;   FINGER SURGERY Left 2001   titanium in left 5th finger   HERNIA REPAIR  2000   SHOULDER SURGERY Left 2009   TRANSURETHRAL RESECTION OF BLADDER TUMOR N/A 02/04/2018   Procedure: TRANSURETHRAL RESECTION OF BLADDER TUMOR (TURBT) with gemcitabine;  Surgeon: Abbie Sons, MD;  Location: [facility] OR[facility];  Service: Urology;  Laterality: N/A;   TRANSURETHRAL RESECTION OF BLADDER TUMOR N/A 10/13/2019   Procedure: TRANSURETHRAL RESECTION OF BLADDER TUMOR (TURBT) with gemcitabine;  Surgeon: Abbie Sons, MD;  Location: [facility] ORS;  Service: Urology;  Laterality: N/A;   VASECTOMY  1987    Home Medications:  Current Meds  Medication Sig   acetaminophen (TYLENOL) 650 MG CR tablet Take 650-1,300 mg by mouth every 8 (eight) hours as needed for pain.   Carboxymethylcellul-Glycerin (REFRESH OPTIVE OP) Place 1 drop into both eyes daily as needed (dryness).   cholecalciferol (VITAMIN D3) 25 MCG (1000 UNIT) tablet Take 1,000 Units by mouth daily.   ibuprofen (ADVIL,MOTRIN) 200 MG tablet Take 200-400 mg by mouth daily as needed for  moderate pain.   loratadine (CLARITIN) 10 MG tablet Take 10 mg by mouth daily.   MULTIPLE VITAMIN PO Take 1 tablet by mouth daily.    Omega-3 Fatty Acids (FISH OIL) 1200 MG CAPS Take 2,400 mg by mouth daily.   SUPER B COMPLEX/C CAPS Take 1 capsule by mouth daily.    Allergies:  Allergies  Allergen Reactions   Hydrocodone-Acetaminophen Nausea And Vomiting   Oxycodone Nausea And Vomiting    No family history on file.  Social History:  reports that he quit smoking about 43 years ago. His smoking use included cigarettes. He has never used smokeless tobacco. He reports current alcohol use of about 2.0 standard drinks of alcohol per week. He reports previous drug use. Drugs: LSD and Opium.  ROS: Otherwise noncontributory except as per the HPI  Physical Exam:  Vital signs in last 24 hours:   Constitutional:  Alert and oriented, No acute distress HEENT:  AT, moist mucus membranes.  Trachea midline, no masses Cardiovascular: Regular rate and rhythm, no clubbing, cyanosis, or edema. Respiratory: Normal respiratory effort, lungs clear bilaterally   Laboratory Data:  No results for input(s): WBC, HGB, HCT in the last 72 hours. No results for input(s): NA, K, CL, CO2, GLUCOSE, BUN, CREATININE, CALCIUM in the last 72 hours. No results for input(s): LABPT, INR in the last 72 hours. No results for input(s): LABURIN in the last 72 hours. Results for orders placed or performed in visit on 12/16/20  CULTURE, URINE COMPREHENSIVE     Status: None   Collection  Time: 12/16/20  2:28 PM   Specimen: Urine   UR  Result Value Ref Range Status   Urine Culture, Comprehensive Final report  Final   Organism ID, Bacteria Comment  Final    Comment: No growth in 36 - 48 hours.    Impression/Assessment:  70 y.o. male with history of urothelial carcinoma the bladder and a papillary lesion posterior wall consistent with recurrent urothelial carcinoma  Plan:  TURBT The procedure was discussed in detail  include potential risks of bleeding, infection and bladder perforation.    12/27/2020, 7:21 AM  John Giovanni,  MD

## 2020-12-27 NOTE — Op Note (Signed)
Preoperative diagnosis:  Bladder tumor  Postoperative diagnosis:  Same  Procedure: Bladder biopsy with fulguration  Surgeon: Abbie Sons, MD  Anesthesia: General  Complications: None  Intraoperative findings:  Cystoscopy-urethra normal in caliber without strictures; prostate mild lateral lobe enlargement with normal bladder neck; <5 mm papillary lesion bladder dome.  No other mucosal abnormalities noted.  UOs normal with clear efflux  EBL: Minimal  Specimens: Bladder biopsy  Indication: Francis Jones is a 70 y.o. male with a history of high-grade TA urothelial carcinoma completing BCG 2019.  He had a recurrent tumor May 2021 which was a low-grade urothelial carcinoma.  Recent surveillance cystoscopy with a small papillary lesion upper posterior wall.  After reviewing the management options for treatment, he elected to proceed with the above surgical procedure(s). We have discussed the potential benefits and risks of the procedure, side effects of the proposed treatment, the likelihood of the patient achieving the goals of the procedure, and any potential problems that might occur during the procedure or recuperation. Informed consent has been obtained.  Description of procedure:  The patient was taken to the operating room and general anesthesia was induced.  The patient was placed in the dorsal lithotomy position, prepped and draped in the usual sterile fashion, and preoperative antibiotics were administered. A preoperative time-out was performed.   A 24 French resectoscope sheath with obturator was lubricated and passed per urethra.  The [facility] resectoscope with loop was then placed into the sheath and panendoscopy was performed with findings as described above.  Based on tumor size and location it was elected to perform biopsy with fulguration and lieu of resection.  The 24 French resectoscope was removed and replaced with a 26 French resectoscope sheath.  A cold cup biopsy  forcep was then inserted into the sheath and a single biopsy was able to remove the lesion in total. The resectoscope with loop was then replaced into the sheath and the biopsy site as well as the surrounding mucosa was fulgurated.  No significant bleeding was noted.  The resectoscope was removed.  After anesthetic reversal he was transported to the PACU in stable condition.   Plan: He will be contacted with the biopsy results and further follow-up recommendations at that time    Abbie Sons, M.D.

## 2020-12-27 NOTE — Anesthesia Procedure Notes (Signed)
Procedure Name: LMA Insertion Date/Time: 12/27/2020 8:30 AM Performed by: Rona Ravens, CRNA Pre-anesthesia Checklist: Patient identified, Patient being monitored, Timeout performed, Emergency Drugs available and Suction available Patient Re-evaluated:Patient Re-evaluated prior to induction Oxygen Delivery Method: Circle system utilized Preoxygenation: Pre-oxygenation with 100% oxygen Induction Type: IV induction Ventilation: Mask ventilation without difficulty LMA: LMA inserted LMA Size: 5.0 Tube type: Oral Number of attempts: 1 Placement Confirmation: positive ETCO2 and breath sounds checked- equal and bilateral Tube secured with: Tape Dental Injury: Teeth and Oropharynx as per pre-operative assessment

## 2020-12-28 LAB — SURGICAL PATHOLOGY

## 2020-12-30 ENCOUNTER — Encounter: Payer: Self-pay | Admitting: Urology

## 2021-01-04 ENCOUNTER — Other Ambulatory Visit: Payer: Self-pay | Admitting: Urology

## 2021-01-04 DIAGNOSIS — C679 Malignant neoplasm of bladder, unspecified: Secondary | ICD-10-CM

## 2021-01-10 ENCOUNTER — Encounter: Payer: Self-pay | Admitting: *Deleted

## 2021-01-10 ENCOUNTER — Other Ambulatory Visit: Payer: Self-pay

## 2021-01-10 ENCOUNTER — Ambulatory Visit (INDEPENDENT_AMBULATORY_CARE_PROVIDER_SITE_OTHER): Payer: Medicare Other

## 2021-01-10 DIAGNOSIS — R0600 Dyspnea, unspecified: Secondary | ICD-10-CM

## 2021-01-10 DIAGNOSIS — R0609 Other forms of dyspnea: Secondary | ICD-10-CM

## 2021-01-10 DIAGNOSIS — I4891 Unspecified atrial fibrillation: Secondary | ICD-10-CM | POA: Diagnosis not present

## 2021-01-10 MED ORDER — PERFLUTREN LIPID MICROSPHERE
1.0000 mL | INTRAVENOUS | Status: AC | PRN
Start: 2021-01-10 — End: 2021-01-10
  Administered 2021-01-10: 3 mL via INTRAVENOUS

## 2021-01-11 ENCOUNTER — Other Ambulatory Visit: Payer: Self-pay

## 2021-01-11 ENCOUNTER — Inpatient Hospital Stay: Payer: Medicare Other | Attending: Internal Medicine | Admitting: Internal Medicine

## 2021-01-11 ENCOUNTER — Inpatient Hospital Stay: Payer: Medicare Other

## 2021-01-11 ENCOUNTER — Encounter: Payer: Self-pay | Admitting: Internal Medicine

## 2021-01-11 DIAGNOSIS — E669 Obesity, unspecified: Secondary | ICD-10-CM | POA: Diagnosis not present

## 2021-01-11 DIAGNOSIS — C678 Malignant neoplasm of overlapping sites of bladder: Secondary | ICD-10-CM | POA: Diagnosis not present

## 2021-01-11 DIAGNOSIS — I4891 Unspecified atrial fibrillation: Secondary | ICD-10-CM | POA: Insufficient documentation

## 2021-01-11 DIAGNOSIS — Z5111 Encounter for antineoplastic chemotherapy: Secondary | ICD-10-CM | POA: Diagnosis not present

## 2021-01-11 LAB — ECHOCARDIOGRAM COMPLETE
AR max vel: 3.09 cm2
AV Area VTI: 3.41 cm2
AV Area mean vel: 3.1 cm2
AV Mean grad: 4 mmHg
AV Peak grad: 7.4 mmHg
Ao pk vel: 1.36 m/s
Area-P 1/2: 3.3 cm2
Calc EF: 69 %
MV VTI: 3.64 cm2
S' Lateral: 3.7 cm
Single Plane A2C EF: 72.7 %
Single Plane A4C EF: 63.1 %

## 2021-01-11 NOTE — Progress Notes (Signed)
[facility] NOTE  Patient Care Team: Jerrol Banana., MD as PCP - General (Family Medicine) Kate Sable, MD as PCP - Cardiology (Cardiology)  CHIEF COMPLAINTS/PURPOSE OF CONSULTATION: Bladder cancer  #  Oncology History Overview Note  July 2022- A. BLADDER LESION; BIOPSY:  - NON-INVASIVE PAPILLARY UROTHELIAL CARCINOMA, LOW GRADE.  - MUSCULARIS PROPRIA IS NOT PRESENT FOR EVALUATION.  MQY 2021 DIAGNOSIS:  A. BLADDER TUMOR; BIOPSY:  - PAPILLARY UROTHELIAL CARCINOMA, LOW-GRADE.  - NEGATIVE FOR INVASION.  - MUSCULARIS PROPRIA IS FOCALLY PRESENT AND IS NEGATIVE FOR TUMOR  Aug 2019  DIAGNOSIS:  A. BLADDER TUMOR; TURBT:  - PAPILLARY UROTHELIAL CARCINOMA, HIGH-GRADE (WHO/ISUP 2004).  - NON-INVASIVE.  - MUSCULARIS PROPRIA IS NOT PRESENT FOR EVALUATION.   # A.fib [id number]; Dr.Agbor- awaiting 2 d ecoh-waiting on anti-coag]   Cancer of overlapping sites of bladder ([place])  01/11/2021 Initial Diagnosis   Cancer of overlapping sites of bladder ([place])   01/22/2021 Cancer Staging   Staging form: Urinary Bladder, AJCC 8th Edition - Clinical: Stage 0a (cTa, cN0, cM0) - Signed by Cammie Sickle, MD on 01/22/2021      HISTORY OF PRESENTING ILLNESS:  Francis Jones 70 y.o.  male with recurrent noninvasive bladder cancer is referred to [place] to discuss regarding adjuvant intravesical chemotherapy.  Patient's bladder cancer history summarized above.  Patient had a most recent cystoscopy which showed 5 mm posterior papillary lesion.  Of note patient recently diagnosed with A. fib.  Is currently being evaluated by cardiology for 2D echo.  He is not yet been started on anticoagulation.  Review of Systems  Constitutional:  Negative for chills, diaphoresis, fever, malaise/fatigue and weight loss.  HENT:  Negative for nosebleeds and sore throat.   Eyes:  Negative for double vision.  Respiratory:  Negative for cough, hemoptysis, sputum production,  shortness of breath and wheezing.   Cardiovascular:  Negative for chest pain, palpitations, orthopnea and leg swelling.  Gastrointestinal:  Negative for abdominal pain, blood in stool, constipation, diarrhea, heartburn, melena, nausea and vomiting.  Genitourinary:  Negative for dysuria, frequency and urgency.  Musculoskeletal:  Negative for back pain and joint pain.  Skin: Negative.  Negative for itching and rash.  Neurological:  Negative for dizziness, tingling, focal weakness, weakness and headaches.  Endo/Heme/Allergies:  Does not bruise/bleed easily.  Psychiatric/Behavioral:  Negative for depression. The patient is not nervous/anxious and does not have insomnia.     MEDICAL HISTORY:  Past Medical History:  Diagnosis Date   Atrial fibrillation ([place]) 12/16/2020   Bladder cancer ([place])    Dyspnea    Medical history non-contributory    PONV (postoperative nausea and vomiting)    Urothelial carcinoma of bladder ([place])     SURGICAL HISTORY: Past Surgical History:  Procedure Laterality Date   APPENDECTOMY  1973   CYSTOSCOPY WITH BIOPSY N/A 12/27/2020   Procedure: CYSTOSCOPY WITH BLADDER BIOPSY;  Surgeon: Abbie Sons, MD;  Location: [facility];  Service: Urology;  Laterality: N/A;   CYSTOSCOPY WITH FULGERATION N/A 12/27/2020   Procedure: CYSTOSCOPY WITH FULGERATION;  Surgeon: Abbie Sons, MD;  Location: [facility];  Service: Urology;  Laterality: N/A;   CYSTOSCOPY WITH STENT PLACEMENT Right 02/04/2018   Procedure: CYSTOSCOPY;  Surgeon: Abbie Sons, MD;  Location: [facility];  Service: Urology;  Laterality: Right;   FINGER SURGERY Left 2001   titanium in left 5th finger   HERNIA REPAIR  2000   SHOULDER SURGERY Left 2009   TRANSURETHRAL RESECTION OF  BLADDER TUMOR N/A 02/04/2018   Procedure: TRANSURETHRAL RESECTION OF BLADDER TUMOR (TURBT) with gemcitabine;  Surgeon: Abbie Sons, MD;  Location: [facility];  Service: Urology;  Laterality: N/A;   TRANSURETHRAL RESECTION OF BLADDER  TUMOR N/A 10/13/2019   Procedure: TRANSURETHRAL RESECTION OF BLADDER TUMOR (TURBT) with gemcitabine;  Surgeon: Abbie Sons, MD;  Location: [facility];  Service: Urology;  Laterality: N/A;   VASECTOMY  1987    SOCIAL HISTORY: Social History   Socioeconomic History   Marital status: Married    Spouse name: Pricilla Holm   Number of children: Not on file   Years of education: Not on file   Highest education level: Not on file  Occupational History   Occupation: retired Engineer, drilling  Tobacco Use   Smoking status: Former    Types: Cigarettes    Quit date: 1979    Years since quitting: 43.6   Smokeless tobacco: Never  Vaping Use   Vaping Use: Never used  Substance and Sexual Activity   Alcohol use: Yes    Alcohol/week: 2.0 standard drinks    Types: 1 Glasses of wine, 1 Cans of beer per week    Comment: occasional   Drug use: Not Currently    Types: LSD, Opium    Comment: as a teenager   Sexual activity: Yes    Birth control/protection: None  Other Topics Concern   Not on file  Social History Narrative   Pt lives with wife; retd- Sports administrator- Conservation officer, [employer]. 5 years smoker- remote.    Social Determinants of Health   Financial Resource Strain: Not on file  Food Insecurity: Not on file  Transportation Needs: Not on file  Physical Activity: Not on file  Stress: Not on file  Social Connections: Not on file  Intimate Partner Violence: Not on file    FAMILY HISTORY: Family History  Problem Relation Age of Onset   Alzheimer's disease Father     ALLERGIES:  is allergic to hydrocodone-acetaminophen and oxycodone.  MEDICATIONS:  Current Outpatient Medications  Medication Sig Dispense Refill   acetaminophen (TYLENOL) 650 MG CR tablet Take 650-1,300 mg by mouth every 8 (eight) hours as needed for pain.     Carboxymethylcellul-Glycerin (REFRESH OPTIVE OP) Place 1 drop into both eyes daily as needed (dryness).     cholecalciferol (VITAMIN D3) 25 MCG (1000 UNIT) tablet Take 1,000  Units by mouth daily.     ibuprofen (ADVIL,MOTRIN) 200 MG tablet Take 200-400 mg by mouth daily as needed for moderate pain.     loratadine (CLARITIN) 10 MG tablet Take 10 mg by mouth daily.     metoprolol succinate (TOPROL XL) 25 MG 24 hr tablet Take 1 tablet (25 mg total) by mouth daily. 30 tablet 5   MULTIPLE VITAMIN PO Take 1 tablet by mouth daily.      Omega-3 Fatty Acids (FISH OIL) 1200 MG CAPS Take 2,400 mg by mouth daily.     SUPER B COMPLEX/C CAPS Take 1 capsule by mouth daily.     No current facility-administered medications for this visit.      Marland Kitchen  PHYSICAL EXAMINATION: ECOG PERFORMANCE STATUS: 0 - Asymptomatic  Vitals:   01/11/21 1130  BP: 118/82  Pulse: (!) 55  Resp: 18  Temp: 97.6 F (36.4 C)   Filed Weights   01/11/21 1130  Weight: 273 lb 6.4 oz (124 kg)    Physical Exam Vitals and nursing note reviewed.  Constitutional:      Comments: Alone walking independently.  HENT:     Head: Normocephalic and atraumatic.     Mouth/Throat:     Pharynx: Oropharynx is clear.  Eyes:     Extraocular Movements: Extraocular movements intact.     Pupils: Pupils are equal, round, and reactive to light.  Cardiovascular:     Rate and Rhythm: Normal rate. Rhythm irregular.  Pulmonary:     Comments: Decreased breath sounds bilaterally.  Abdominal:     Palpations: Abdomen is soft.  Musculoskeletal:        General: Normal range of motion.     Cervical back: Normal range of motion.  Skin:    General: Skin is warm.  Neurological:     General: No focal deficit present.     Mental Status: He is alert and oriented to person, place, and time.  Psychiatric:        Behavior: Behavior normal.        Judgment: Judgment normal.    LABORATORY DATA:  I have reviewed the data as listed Lab Results  Component Value Date   WBC 9.1 12/16/2020   HGB 15.8 12/16/2020   HCT 46.0 12/16/2020   MCV 87.0 12/16/2020   PLT 232 12/16/2020   Recent Labs    12/16/20 1320  NA 137  K  3.6  CL 104  CO2 24  GLUCOSE 153*  BUN 12  CREATININE 0.89  CALCIUM 9.0  GFRNONAA >60    RADIOGRAPHIC STUDIES: I have personally reviewed the radiological images as listed and agreed with the findings in the report. ECHOCARDIOGRAM COMPLETE  Result Date: 01/11/2021    ECHOCARDIOGRAM REPORT   Patient Name:   Francis Jones Date of Exam: 01/10/2021 Medical Rec #:  [id number]       Height:       72.0 in Accession #:    [id number]      Weight:       275.0 lb Date of Birth:  [birth year]        BSA:          2.439 m Patient Age:    39 years        BP:           118/78 mmHg Patient Gender: M               HR:           87 bpm. Exam Location:  [facility] Procedure: 2D Echo, Color Doppler, Cardiac Doppler and Intracardiac            Opacification Agent Indications:    I48.91 Atrial fibrillation; R06.00 Dyspnea on exertion  History:        Patient has no prior history of Echocardiogram examinations.                 Signs/Symptoms:Shortness of Breath.  Sonographer:    Charmayne Sheer Referring Phys: [id number] [facility]  Sonographer Comments: Suboptimal apical window and suboptimal subcostal window. Image acquisition challenging due to patient body habitus. IMPRESSIONS  1. Left ventricular ejection fraction, by estimation, is 55 to 60%. The left ventricle has normal function. The left ventricle has no regional wall motion abnormalities. There is mild left ventricular hypertrophy. Left ventricular diastolic function could not be evaluated.  2. Right ventricular systolic function is normal. The right ventricular size is mildly enlarged. Tricuspid regurgitation signal is inadequate for assessing PA pressure.  3. The mitral valve is grossly normal. No evidence of mitral valve regurgitation. No evidence of mitral stenosis.  4.  The aortic valve is tricuspid. There is mild thickening of the aortic valve. Aortic valve regurgitation is not visualized. Mild aortic valve sclerosis is present, with no evidence of aortic valve  stenosis.  5. The inferior vena cava is normal in size with greater than 50% respiratory variability, suggesting right atrial pressure of 3 mmHg. FINDINGS  Left Ventricle: Left ventricular ejection fraction, by estimation, is 55 to 60%. The left ventricle has normal function. The left ventricle has no regional wall motion abnormalities. Definity contrast agent was given IV to delineate the left ventricular  endocardial borders. The left ventricular internal cavity size was normal in size. There is mild left ventricular hypertrophy. Left ventricular diastolic function could not be evaluated due to atrial fibrillation. Left ventricular diastolic function could not be evaluated. Right Ventricle: The right ventricular size is mildly enlarged. No increase in right ventricular wall thickness. Right ventricular systolic function is normal. Tricuspid regurgitation signal is inadequate for assessing PA pressure. Left Atrium: Left atrial size was normal in size. Right Atrium: Right atrial size was normal in size. Pericardium: There is no evidence of pericardial effusion. Mitral Valve: The mitral valve is grossly normal. No evidence of mitral valve regurgitation. No evidence of mitral valve stenosis. MV peak gradient, 2.8 mmHg. The mean mitral valve gradient is 1.0 mmHg. Tricuspid Valve: The tricuspid valve is normal in structure. Tricuspid valve regurgitation is trivial. Aortic Valve: The aortic valve is tricuspid. There is mild thickening of the aortic valve. Aortic valve regurgitation is not visualized. Mild aortic valve sclerosis is present, with no evidence of aortic valve stenosis. Aortic valve mean gradient measures 4.0 mmHg. Aortic valve peak gradient measures 7.4 mmHg. Aortic valve area, by VTI measures 3.41 cm. Pulmonic Valve: The pulmonic valve was normal in structure. Pulmonic valve regurgitation is trivial. No evidence of pulmonic stenosis. Aorta: The aortic root is normal in size and structure. Pulmonary Artery:  The pulmonary artery is of normal size. Venous: The inferior vena cava is normal in size with greater than 50% respiratory variability, suggesting right atrial pressure of 3 mmHg. IAS/Shunts: No atrial level shunt detected by color flow Doppler.  LEFT VENTRICLE PLAX 2D LVIDd:         4.90 cm      Diastology LVIDs:         3.70 cm      LV e' medial:    11.60 cm/s LV PW:         1.20 cm      LV E/e' medial:  6.4 LV IVS:        1.20 cm      LV e' lateral:   7.29 cm/s LVOT diam:     2.30 cm      LV E/e' lateral: 10.1 LV SV:         74 LV SV Index:   30 LVOT Area:     4.15 cm  LV Volumes (MOD) LV vol d, MOD A2C: 103.0 ml LV vol d, MOD A4C: 91.9 ml LV vol s, MOD A2C: 28.1 ml LV vol s, MOD A4C: 33.9 ml LV SV MOD A2C:     74.9 ml LV SV MOD A4C:     91.9 ml LV SV MOD BP:      68.4 ml RIGHT VENTRICLE RV Basal diam:  4.57 cm TAPSE (M-mode): 2.1 cm LEFT ATRIUM             Index       RIGHT ATRIUM  Index LA diam:        4.10 cm 1.68 cm/m  RA Area:     15.70 cm LA Vol (A2C):   51.0 ml 20.91 ml/m RA Volume:   40.00 ml  16.40 ml/m LA Vol (A4C):   49.1 ml 20.13 ml/m LA Biplane Vol: 50.4 ml 20.66 ml/m  AORTIC VALVE                   PULMONIC VALVE AV Area (Vmax):    3.09 cm    PV Vmax:       1.39 m/s AV Area (Vmean):   3.10 cm    PV Vmean:      82.200 cm/s AV Area (VTI):     3.41 cm    PV VTI:        0.197 m AV Vmax:           136.00 cm/s PV Peak grad:  7.7 mmHg AV Vmean:          91.600 cm/s PV Mean grad:  3.0 mmHg AV VTI:            0.217 m AV Peak Grad:      7.4 mmHg AV Mean Grad:      4.0 mmHg LVOT Vmax:         101.00 cm/s LVOT Vmean:        68.300 cm/s LVOT VTI:          0.178 m LVOT/AV VTI ratio: 0.82  AORTA Ao Root diam: 3.50 cm MITRAL VALVE MV Area (PHT): 3.30 cm    SHUNTS MV Area VTI:   3.64 cm    Systemic VTI:  0.18 m MV Peak grad:  2.8 mmHg    Systemic Diam: 2.30 cm MV Mean grad:  1.0 mmHg MV Vmax:       0.84 m/s MV Vmean:      54.6 cm/s MV Decel Time: 230 msec MV E velocity: 73.70 cm/s Nelva Bush MD Electronically signed by Nelva Bush MD Signature Date/Time: 01/11/2021/5:39:24 AM    Final     ASSESSMENT & PLAN:   Cancer of overlapping sites of bladder ([place]) #Recurrent noninvasive bladder cancer-failed BCG.  Discussed the role of adjuvant intravesical chemotherapy to reduce the risk of recurrence and also to reduce the risk of progression to muscle invasive disease.    Given previous failure of BCG; I would recommend intravesical chemotherapy with gemcitabine weekly x6.  Discussed the potential complications mostly limited to to-local discomfort with catheter placement/bladder irritation.  Would not expect to have any systemic side effects from chemotherapy.  Discussed with the patient that logistics of catheter placement/and the plan to treatments to start after discussion with urology.  #A. Fib [new diagnosis July 2022]-awaiting 2D echo/anticoagulation.   # Obesity:  Discussed importance of healthy weight/and weight loss.  Strongly recommend eating more green leafy vegetables and cutting down processed food/ carbohydrates.  Instead increasing whole grains / protein in the diet.  Multiple studies have shown that optimal weight would help improve cardiovascular risk; also shown to cut on the risk of malignancies-colon cancer, breast cancer ovarian/uterine cancer in women and also prostate cancer in men.   Thank you Dr.Stoioff for allowing me to participate in the care of your pleasant patient. Please do not hesitate to contact me with questions or concerns in the interim.  # DISPOSITION: # follow up TBD-Dr.B  All questions were answered. The patient knows to call the clinic with any problems, questions  or concerns.    Cammie Sickle, MD 01/22/2021 12:45 AM

## 2021-01-11 NOTE — Assessment & Plan Note (Addendum)
#  Recurrent noninvasive bladder cancer-failed BCG.  Discussed the role of adjuvant intravesical chemotherapy to reduce the risk of recurrence and also to reduce the risk of progression to muscle invasive disease.    Given previous failure of BCG; I would recommend intravesical chemotherapy with gemcitabine weekly x6.  Discussed the potential complications mostly limited to to-local discomfort with catheter placement/bladder irritation.  Would not expect to have any systemic side effects from chemotherapy.  Discussed with the patient that logistics of catheter placement/and the plan to treatments to start after discussion with urology.  #A. Fib [new diagnosis July 2022]-awaiting 2D echo/anticoagulation.   # Obesity:  Discussed importance of healthy weight/and weight loss.  Strongly recommend eating more green leafy vegetables and cutting down processed food/ carbohydrates.  Instead increasing whole grains / protein in the diet.  Multiple studies have shown that optimal weight would help improve cardiovascular risk; also shown to cut on the risk of malignancies-colon cancer, breast cancer ovarian/uterine cancer in women and also prostate cancer in men.   Thank you Dr.Stoioff for allowing me to participate in the care of your pleasant patient. Please do not hesitate to contact me with questions or concerns in the interim.  # DISPOSITION: # follow up TBD-Dr.B

## 2021-01-23 ENCOUNTER — Telehealth: Payer: Self-pay | Admitting: *Deleted

## 2021-01-23 ENCOUNTER — Other Ambulatory Visit: Payer: Self-pay

## 2021-01-23 DIAGNOSIS — C678 Malignant neoplasm of overlapping sites of bladder: Secondary | ICD-10-CM

## 2021-01-23 NOTE — Telephone Encounter (Signed)
-----   Message from Cammie Sickle, MD sent at 01/22/2021 12:32 AM EDT ----- Regarding: intravescical gem Matt/Jenny- I would appreciate if you can enter the orders for gemcitabine intravesical weekly x6.  C: please schedule appt- MD; labs- cbc/cmp- #1 gemcitabine intravesical chemo; and the weekly 5 more intravesical chemo.   Thanks, GB

## 2021-01-24 ENCOUNTER — Other Ambulatory Visit: Payer: Self-pay | Admitting: Internal Medicine

## 2021-01-24 NOTE — Progress Notes (Signed)
DISCONTINUE ON PATHWAY REGIMEN - Other     START OFF PATHWAY REGIMEN - Other   [id number]:Gemcitabine Intravesical 2,000 mg D1 q7 Days x 6 Cycles:   A cycle is every 7 days:     Gemcitabine   **Always confirm dose/schedule in your pharmacy ordering system**  Patient Characteristics: Intent of Therapy: Curative Intent, Discussed with Patient

## 2021-01-26 ENCOUNTER — Encounter: Payer: Self-pay | Admitting: Cardiology

## 2021-01-26 ENCOUNTER — Ambulatory Visit (INDEPENDENT_AMBULATORY_CARE_PROVIDER_SITE_OTHER): Payer: Medicare Other | Admitting: Cardiology

## 2021-01-26 ENCOUNTER — Other Ambulatory Visit: Payer: Self-pay

## 2021-01-26 VITALS — BP 122/70 | HR 92 | Ht 72.0 in | Wt 272.0 lb

## 2021-01-26 DIAGNOSIS — R0609 Other forms of dyspnea: Secondary | ICD-10-CM

## 2021-01-26 DIAGNOSIS — I4819 Other persistent atrial fibrillation: Secondary | ICD-10-CM

## 2021-01-26 DIAGNOSIS — R0600 Dyspnea, unspecified: Secondary | ICD-10-CM | POA: Diagnosis not present

## 2021-01-26 MED ORDER — ASPIRIN EC 81 MG PO TBEC: 81.0000 mg | DELAYED_RELEASE_TABLET | Freq: Every day | ORAL | 3 refills | Status: DC

## 2021-01-26 NOTE — Progress Notes (Signed)
Cardiology Office Note:    Date:  01/26/2021   ID:  Francis Jones, DOB 11-10-1950, MRN [id number]  PCP:  Francis Jones., MD   [facility] Providers Cardiologist:  Francis Sable, MD     Referring MD: Francis Jones.,*   Chief Complaint  Patient presents with   Other    Follow up post ECHO. Meds reviewed verbally with patient.     History of Present Illness:    Francis Jones is a 70 y.o. male with a hx of bladder cancer who presents for follow-up.  He was previously seen due to new onset atrial fibrillation and preop eval for cystoscopy due to bladder cancer.  Echocardiogram was obtained.  Bladder cancer chemotherapy is being planned next week.  He feels well, denies chest pain, palpitations.  Endorsed having shortness of breath with exertion.  He is eating healthy, working on weight loss.  Had a sleep study years ago and was told he does not have sleep apnea, he endorsed snoring.  Was started on Toprol-XL, tolerating beta-blocker without any adverse effects.   Past Medical History:  Diagnosis Date   Atrial fibrillation ([place]) 12/16/2020   Bladder cancer ([place])    Dyspnea    Medical history non-contributory    PONV (postoperative nausea and vomiting)    Urothelial carcinoma of bladder ([place])     Past Surgical History:  Procedure Laterality Date   APPENDECTOMY  1973   CYSTOSCOPY WITH BIOPSY N/A 12/27/2020   Procedure: CYSTOSCOPY WITH BLADDER BIOPSY;  Surgeon: Francis Sons, MD;  Location: [facility] ORS;  Service: Urology;  Laterality: N/A;   CYSTOSCOPY WITH FULGERATION N/A 12/27/2020   Procedure: CYSTOSCOPY WITH FULGERATION;  Surgeon: Francis Sons, MD;  Location: [facility] ORS;  Service: Urology;  Laterality: N/A;   CYSTOSCOPY WITH STENT PLACEMENT Right 02/04/2018   Procedure: CYSTOSCOPY;  Surgeon: Francis Sons, MD;  Location: [facility] ORS;  Service: Urology;  Laterality: Right;   FINGER SURGERY Left 2001   titanium in left 5th finger   HERNIA REPAIR  2000    SHOULDER SURGERY Left 2009   TRANSURETHRAL RESECTION OF BLADDER TUMOR N/A 02/04/2018   Procedure: TRANSURETHRAL RESECTION OF BLADDER TUMOR (TURBT) with gemcitabine;  Surgeon: Francis Sons, MD;  Location: [facility] ORS;  Service: Urology;  Laterality: N/A;   TRANSURETHRAL RESECTION OF BLADDER TUMOR N/A 10/13/2019   Procedure: TRANSURETHRAL RESECTION OF BLADDER TUMOR (TURBT) with gemcitabine;  Surgeon: Francis Sons, MD;  Location: [facility] OR[facility];  Service: Urology;  Laterality: N/A;   VASECTOMY  1987    Current Medications: Current Meds  Medication Sig   acetaminophen (TYLENOL) 650 MG CR tablet Take 650-1,300 mg by mouth every 8 (eight) hours as needed for pain.   aspirin EC 81 MG tablet Take 1 tablet (81 mg total) by mouth daily. Swallow whole.   Carboxymethylcellul-Glycerin (REFRESH OPTIVE OP) Place 1 drop into both eyes daily as needed (dryness).   cholecalciferol (VITAMIN D3) 25 MCG (1000 UNIT) tablet Take 1,000 Units by mouth daily.   ibuprofen (ADVIL,MOTRIN) 200 MG tablet Take 200-400 mg by mouth daily as needed for moderate pain.   loratadine (CLARITIN) 10 MG tablet Take 10 mg by mouth daily.   metoprolol succinate (TOPROL XL) 25 MG 24 hr tablet Take 1 tablet (25 mg total) by mouth daily.   MULTIPLE VITAMIN PO Take 1 tablet by mouth daily.    Omega-3 Fatty Acids (FISH OIL) 1200 MG CAPS Take 2,400 mg by mouth daily.  SUPER B COMPLEX/C CAPS Take 1 capsule by mouth daily.     Allergies:   Hydrocodone-acetaminophen and Oxycodone   Social History   Socioeconomic History   Marital status: Married    Spouse name: Francis Jones   Number of children: Not on file   Years of education: Not on file   Highest education level: Not on file  Occupational History   Occupation: retired Engineer, drilling  Tobacco Use   Smoking status: Former    Types: Cigarettes    Quit date: 1979    Years since quitting: 43.6   Smokeless tobacco: Never  Vaping Use   Vaping Use: Never used  Substance and  Sexual Activity   Alcohol use: Yes    Alcohol/week: 2.0 standard drinks    Types: 1 Glasses of wine, 1 Cans of beer per week    Comment: occasional   Drug use: Not Currently    Types: LSD, Opium    Comment: as a teenager   Sexual activity: Yes    Birth control/protection: None  Other Topics Concern   Not on file  Social History Narrative   Pt lives with wife; retd- Sports administrator- Conservation officer, [employer]. 5 years smoker- remote.    Social Determinants of Health   Financial Resource Strain: Not on file  Food Insecurity: Not on file  Transportation Needs: Not on file  Physical Activity: Not on file  Stress: Not on file  Social Connections: Not on file     Family History: The patient's family history includes Alzheimer's disease in his father.  ROS:   Please see the history of present illness.     All other systems reviewed and are negative.  EKGs/Labs/Other Studies Reviewed:    The following studies were reviewed today:   EKG:  EKG not ordered today.   Recent Labs: 12/16/2020: BUN 12; Creatinine, Ser 0.89; Hemoglobin 15.8; Platelets 232; Potassium 3.6; Sodium 137  Recent Lipid Panel No results found for: CHOL, TRIG, HDL, CHOLHDL, VLDL, LDLCALC, LDLDIRECT   Risk Assessment/Calculations:          Physical Exam:    VS:  BP 122/70 (BP Location: Left Arm, Patient Position: Sitting, Cuff Size: Normal)   Pulse 92   Ht 6' (1.829 m)   Wt 272 lb (123.4 kg)   SpO2 97%   BMI 36.89 kg/m     Wt Readings from Last 3 Encounters:  01/26/21 272 lb (123.4 kg)  01/11/21 273 lb 6.4 oz (124 kg)  12/19/20 275 lb (124.7 kg)     GEN:  Well nourished, well developed in no acute distress HEENT: Normal NECK: No JVD; No carotid bruits LYMPHATICS: No lymphadenopathy CARDIAC: Irregular irregular, no murmurs, rubs, gallops RESPIRATORY:  Clear to auscultation without rales, wheezing or rhonchi  ABDOMEN: Soft, non-tender, non-distended MUSCULOSKELETAL:  No edema; No deformity  SKIN: Warm and  dry NEUROLOGIC:  Alert and oriented x 3 PSYCHIATRIC:  Normal affect   ASSESSMENT:    1. Persistent atrial fibrillation ([place])   2. Dyspnea on exertion     PLAN:    In order of problems listed above:  persistent atrial fibrillation, heart rate 99.  CHA2DS2-VASc of 1.  Echo with normal EF, 55 to 60%.  Normal LA size.  Continue Toprol-XL 25 mg daily, okay to start aspirin 81 mg.  Risk benefits of anticoagulation discussed with patient, both in agreement to continue with aspirin for now.  He is not sure if future urologic procedures are being planned for bladder cancer treatment.  Dyspnea on exertion, echocardiogram with preserved EF, unable to evaluate diastolic function.  Obtain Lexiscan Myoview to evaluate ischemia.  Shortness of breath likely from deconditioning.    Follow-up in 6 months.  Shared Decision Making/Informed Consent The risks [chest pain, shortness of breath, cardiac arrhythmias, dizziness, blood pressure fluctuations, myocardial infarction, stroke/transient ischemic attack, nausea, vomiting, allergic reaction, radiation exposure, metallic taste sensation and life-threatening complications (estimated to be 1 in 10,000)], benefits (risk stratification, diagnosing coronary artery disease, treatment guidance) and alternatives of a nuclear stress test were discussed in detail with Mr. Romanowski and he agrees to proceed.   Medication Adjustments/Labs and Tests Ordered: Current medicines are reviewed at length with the patient today.  Concerns regarding medicines are outlined above.  Orders Placed This Encounter  Procedures   NM Myocar Multi W/Spect W/Wall Motion / EF     Meds ordered this encounter  Medications   aspirin EC 81 MG tablet    Sig: Take 1 tablet (81 mg total) by mouth daily. Swallow whole.    Dispense:  90 tablet    Refill:  3      Patient Instructions  Medication Instructions:   Your physician has recommended you make the following change in your  medication:    START taking Aspirin 81 MG once a day.  *If you need a refill on your cardiac medications before your next appointment, please call your pharmacy*   Lab Work: None ordered If you have labs (blood work) drawn today and your tests are completely normal, you will receive your results only by: [place] (if you have MyChart) OR A paper copy in the mail If you have any lab test that is abnormal or we need to change your treatment, we will call you to review the results.   Testing/Procedures:   [place]    Your caregiver has ordered a Stress Test with nuclear imaging. The purpose of this test is to evaluate the blood supply to your heart muscle. This procedure is referred to as a "Non-Invasive Stress Test." This is because other than having an IV started in your vein, nothing is inserted or "invades" your body. Cardiac stress tests are done to find areas of poor blood flow to the heart by determining the extent of coronary artery disease (CAD). Some patients exercise on a treadmill, which naturally increases the blood flow to your heart, while others who are  unable to walk on a treadmill due to physical limitations have a pharmacologic/chemical stress agent called Lexiscan . This medicine will mimic walking on a treadmill by temporarily increasing your coronary blood flow.      PLEASE REPORT TO [facility] MEDICAL MALL ENTRANCE   THE VOLUNTEERS AT THE FIRST DESK WILL DIRECT YOU WHERE TO GO     *Please note: these test may take anywhere between 2-4 hours to complete       Date of Procedure:_____________________________________   Arrival Time for Procedure:______________________________    PLEASE NOTIFY THE OFFICE AT LEAST 24 HOURS IN ADVANCE IF YOU ARE UNABLE TO KEEP YOUR APPOINTMENT.  [place] 24 HOURS IN ADVANCE IF YOU ARE UNABLE TO KEEP YOUR APPOINTMENT. [phone]         How to prepare for your Myoview  test:    _XX___:  Hold betablocker(s) night before procedure and morning of procedure: metoprolol succinate (TOPROL XL)   1. Do not eat or drink after midnight  2. No caffeine for 24 hours  prior to test  3. No smoking 24 hours prior to test.  4. Unless instructed otherwise, Take your medication with a small sips of water.    5.         Ladies, please do not wear dresses. Skirts or pants are appropriate. Please wear a short sleeve shirt.  6. No perfume, cologne or lotion.  7. Wear comfortable walking shoes. No heels!     Follow-Up: At [facility], you and your health needs are our priority.  As part of our continuing mission to provide you with exceptional heart care, we have created designated Provider Care Teams.  These Care Teams include your primary Cardiologist (physician) and Advanced Practice Providers (APPs -  Physician Assistants and Nurse Practitioners) who all work together to provide you with the care you need, when you need it.  We recommend signing up for the patient portal called "MyChart".  Sign up information is provided on this After Visit Summary.  MyChart is used to connect with patients for Virtual Visits (Telemedicine).  Patients are able to view lab/test results, encounter notes, upcoming appointments, etc.  Non-urgent messages can be sent to your provider as well.   To learn more about what you can do with MyChart, go to [email or web address].    Your next appointment:   6 month(s)  The format for your next appointment:   In Person  Provider:   Kate Sable, MD   Other Instructions    Signed, Francis Sable, MD  01/26/2021 12:40 PM    [place]

## 2021-01-26 NOTE — Patient Instructions (Signed)
Medication Instructions:   Your physician has recommended you make the following change in your medication:    START taking Aspirin 81 MG once a day.  *If you need a refill on your cardiac medications before your next appointment, please call your pharmacy*   Lab Work: None ordered If you have labs (blood work) drawn today and your tests are completely normal, you will receive your results only by: [place] (if you have MyChart) OR A paper copy in the mail If you have any lab test that is abnormal or we need to change your treatment, we will call you to review the results.   Testing/Procedures:   [place]    Your caregiver has ordered a Stress Test with nuclear imaging. The purpose of this test is to evaluate the blood supply to your heart muscle. This procedure is referred to as a "Non-Invasive Stress Test." This is because other than having an IV started in your vein, nothing is inserted or "invades" your body. Cardiac stress tests are done to find areas of poor blood flow to the heart by determining the extent of coronary artery disease (CAD). Some patients exercise on a treadmill, which naturally increases the blood flow to your heart, while others who are  unable to walk on a treadmill due to physical limitations have a pharmacologic/chemical stress agent called Lexiscan . This medicine will mimic walking on a treadmill by temporarily increasing your coronary blood flow.      PLEASE REPORT TO [facility] MEDICAL MALL ENTRANCE   THE VOLUNTEERS AT THE FIRST DESK WILL DIRECT YOU WHERE TO GO     *Please note: these test may take anywhere between 2-4 hours to complete       Date of Procedure:_____________________________________   Arrival Time for Procedure:______________________________    PLEASE NOTIFY THE OFFICE AT LEAST 24 HOURS IN ADVANCE IF YOU ARE UNABLE TO KEEP YOUR APPOINTMENT.  [facility] 24 HOURS IN ADVANCE IF  YOU ARE UNABLE TO KEEP YOUR APPOINTMENT. [phone]         How to prepare for your Myoview test:    _XX___:  Hold betablocker(s) night before procedure and morning of procedure: metoprolol succinate (TOPROL XL)   1. Do not eat or drink after midnight  2. No caffeine for 24 hours prior to test  3. No smoking 24 hours prior to test.  4. Unless instructed otherwise, Take your medication with a small sips of water.    5.         Ladies, please do not wear dresses. Skirts or pants are appropriate. Please wear a short sleeve shirt.  6. No perfume, cologne or lotion.  7. Wear comfortable walking shoes. No heels!     Follow-Up: At [facility], you and your health needs are our priority.  As part of our continuing mission to provide you with exceptional heart care, we have created designated Provider Care Teams.  These Care Teams include your primary Cardiologist (physician) and Advanced Practice Providers (APPs -  Physician Assistants and Nurse Practitioners) who all work together to provide you with the care you need, when you need it.  We recommend signing up for the patient portal called "MyChart".  Sign up information is provided on this After Visit Summary.  MyChart is used to connect with patients for Virtual Visits (Telemedicine).  Patients are able to view lab/test results, encounter notes, upcoming appointments, etc.  Non-urgent messages can be sent  to your provider as well.   To learn more about what you can do with MyChart, go to [email or web address].    Your next appointment:   6 month(s)  The format for your next appointment:   In Person  Provider:   Kate Sable, MD   Other Instructions

## 2021-01-30 ENCOUNTER — Inpatient Hospital Stay: Payer: Medicare Other

## 2021-01-30 ENCOUNTER — Inpatient Hospital Stay (HOSPITAL_BASED_OUTPATIENT_CLINIC_OR_DEPARTMENT_OTHER): Payer: Medicare Other | Admitting: Internal Medicine

## 2021-01-30 ENCOUNTER — Encounter: Payer: Self-pay | Admitting: Internal Medicine

## 2021-01-30 VITALS — BP 115/63 | HR 63 | Resp 16

## 2021-01-30 DIAGNOSIS — E669 Obesity, unspecified: Secondary | ICD-10-CM | POA: Diagnosis not present

## 2021-01-30 DIAGNOSIS — C678 Malignant neoplasm of overlapping sites of bladder: Secondary | ICD-10-CM | POA: Diagnosis not present

## 2021-01-30 DIAGNOSIS — Z5111 Encounter for antineoplastic chemotherapy: Secondary | ICD-10-CM | POA: Diagnosis not present

## 2021-01-30 DIAGNOSIS — I4891 Unspecified atrial fibrillation: Secondary | ICD-10-CM | POA: Diagnosis not present

## 2021-01-30 LAB — CBC WITH DIFFERENTIAL/PLATELET
Abs Immature Granulocytes: 0.02 10*3/uL (ref 0.00–0.07)
Basophils Absolute: 0.1 10*3/uL (ref 0.0–0.1)
Basophils Relative: 1 %
Eosinophils Absolute: 0.5 10*3/uL (ref 0.0–0.5)
Eosinophils Relative: 6 %
HCT: 50.1 % (ref 39.0–52.0)
Hemoglobin: 17 g/dL (ref 13.0–17.0)
Immature Granulocytes: 0 %
Lymphocytes Relative: 25 %
Lymphs Abs: 2.3 10*3/uL (ref 0.7–4.0)
MCH: 29.9 pg (ref 26.0–34.0)
MCHC: 33.9 g/dL (ref 30.0–36.0)
MCV: 88 fL (ref 80.0–100.0)
Monocytes Absolute: 0.8 10*3/uL (ref 0.1–1.0)
Monocytes Relative: 8 %
Neutro Abs: 5.6 10*3/uL (ref 1.7–7.7)
Neutrophils Relative %: 60 %
Platelets: 224 10*3/uL (ref 150–400)
RBC: 5.69 MIL/uL (ref 4.22–5.81)
RDW: 12.2 % (ref 11.5–15.5)
WBC: 9.3 10*3/uL (ref 4.0–10.5)
nRBC: 0 % (ref 0.0–0.2)

## 2021-01-30 LAB — COMPREHENSIVE METABOLIC PANEL
ALT: 22 U/L (ref 0–44)
AST: 20 U/L (ref 15–41)
Albumin: 4.1 g/dL (ref 3.5–5.0)
Alkaline Phosphatase: 66 U/L (ref 38–126)
Anion gap: 7 (ref 5–15)
BUN: 11 mg/dL (ref 8–23)
CO2: 28 mmol/L (ref 22–32)
Calcium: 9 mg/dL (ref 8.9–10.3)
Chloride: 104 mmol/L (ref 98–111)
Creatinine, Ser: 0.82 mg/dL (ref 0.61–1.24)
GFR, Estimated: >60 mL/min (ref >60)
Glucose, Bld: 137 mg/dL — ABNORMAL HIGH (ref 70–99)
Potassium: 4.6 mmol/L (ref 3.5–5.1)
Sodium: 139 mmol/L (ref 135–145)
Total Bilirubin: 0.9 mg/dL (ref 0.3–1.2)
Total Protein: 7.2 g/dL (ref 6.5–8.1)

## 2021-01-30 MED ORDER — PROCHLORPERAZINE MALEATE 10 MG PO TABS
10.0000 mg | ORAL_TABLET | Freq: Once | ORAL | Status: AC
  Administered 2021-01-30: 10 mg via ORAL
  Filled 2021-01-30: qty 1

## 2021-01-30 MED ORDER — GEMCITABINE CHEMO FOR BLADDER INSTILLATION 2000 MG
2000.0000 mg | Freq: Once | INTRAVENOUS | Status: AC
  Administered 2021-01-30: 2000 mg via INTRAVESICAL
  Filled 2021-01-30: qty 52.6

## 2021-01-30 NOTE — Patient Instructions (Signed)
[facility]  Discharge Instructions: Thank you for choosing [facility] to provide your oncology and hematology care.  If you have a lab appointment with the [facility], please go directly to the [facility] and check in at the registration area.  Wear comfortable clothing and clothing appropriate for easy access to any Portacath or PICC line.   We strive to give you quality time with your provider. You may need to reschedule your appointment if you arrive late (15 or more minutes).  Arriving late affects you and other patients whose appointments are after yours.  Also, if you miss three or more appointments without notifying the office, you may be dismissed from the clinic at the provider's discretion.      For prescription refill requests, have your pharmacy contact our office and allow 72 hours for refills to be completed.    Today you received the following chemotherapy and/or immunotherapy agents Gemcitabine      To help prevent nausea and vomiting after your treatment, we encourage you to take your nausea medication as directed.  BELOW ARE SYMPTOMS THAT SHOULD BE REPORTED IMMEDIATELY: *FEVER GREATER THAN 100.4 F (38 C) OR HIGHER *CHILLS OR SWEATING *NAUSEA AND VOMITING THAT IS NOT CONTROLLED WITH YOUR NAUSEA MEDICATION *UNUSUAL SHORTNESS OF BREATH *UNUSUAL BRUISING OR BLEEDING *URINARY PROBLEMS (pain or burning when urinating, or frequent urination) *BOWEL PROBLEMS (unusual diarrhea, constipation, pain near the anus) TENDERNESS IN MOUTH AND THROAT WITH OR WITHOUT PRESENCE OF ULCERS (sore throat, sores in mouth, or a toothache) UNUSUAL RASH, SWELLING OR PAIN  UNUSUAL VAGINAL DISCHARGE OR ITCHING   Items with * indicate a potential emergency and should be followed up as soon as possible or go to the Emergency Department if any problems should occur.  Please show the CHEMOTHERAPY ALERT CARD or IMMUNOTHERAPY ALERT CARD at check-in  to the Emergency Department and triage nurse.  Should you have questions after your visit or need to cancel or reschedule your appointment, please contact [facility]  [phone] and follow the prompts.  Office hours are 8:00 a.m. to 4:30 p.m. Monday - Friday. Please note that voicemails left after 4:00 p.m. may not be returned until the following business day.  We are closed weekends and major holidays. You have access to a nurse at all times for urgent questions. Please call the main number to the clinic [phone] and follow the prompts.  For any non-urgent questions, you may also contact your provider using MyChart. We now offer e-Visits for anyone 57 and older to request care online for non-urgent symptoms. For details visit [email or web address].   Also download the MyChart app! Go to the app store, search "MyChart", open the app, select [facility], and log in with your MyChart username and password.  Due to Covid, a mask is required upon entering the hospital/clinic. If you do not have a mask, one will be given to you upon arrival. For doctor visits, patients may have 1 support person aged 73 or older with them. For treatment visits, patients cannot have anyone with them due to current Covid guidelines and our immunocompromised population.

## 2021-01-30 NOTE — Assessment & Plan Note (Addendum)
High-grade#Recurrent noninvasive bladder cancer-failed BCG.  Discussed the role of adjuvant intravesical chemotherapy to reduce the risk of recurrence and also to reduce the risk of progression to muscle invasive disease/and also development of new high-grade malignancy.  #I reviewed the logistics/process of intervesicular gemcitabine administration which is as follows:  # GEMCITABINE instilled into bladder via catheter [(dwell time of 1-2 hours].   # After 1 to 2 hours unclamp the catheter and allow the urine and gemcitabine to drain  into the drainage bag; After one additional hour of diuresis, remove catheter.  #It is recommended that patient minimize oral fluids [especially containing caffeine] 6 to 8 hours prior to each treatment.  #In terms of side effects some patients can experience bladder irritation, with more frequent or painful urination,  urination at night and some blood or tissue in the urine.  Systemic side effects of chemotherapy do not occur as chemotherapy is not absorbed into the bloodstream.  #We will defer further cystoscopy/surveillance to the urologist, Dr.Stoioff  #A. Fib [new diagnosis July 2022]-awaiting further cardiology work-up.  # DISPOSITION: # bladder chemo;weekly gemctiabine as planned # follow up as needed-Dr.B

## 2021-01-30 NOTE — Progress Notes (Signed)
[place] NOTE  Patient Care Team: Jerrol Banana., MD as PCP - General (Family Medicine) Kate Sable, MD as PCP - Cardiology (Cardiology)  CHIEF COMPLAINTS/PURPOSE OF CONSULTATION: Bladder cancer  #  Oncology History Overview Note  July 2022- A. BLADDER LESION; BIOPSY:  - NON-INVASIVE PAPILLARY UROTHELIAL CARCINOMA, LOW GRADE.  - MUSCULARIS PROPRIA IS NOT PRESENT FOR EVALUATION.  MQY 2021 DIAGNOSIS:  A. BLADDER TUMOR; BIOPSY:  - PAPILLARY UROTHELIAL CARCINOMA, LOW-GRADE.  - NEGATIVE FOR INVASION.  - MUSCULARIS PROPRIA IS FOCALLY PRESENT AND IS NEGATIVE FOR TUMOR  Aug 2019  DIAGNOSIS:  A. BLADDER TUMOR; TURBT:  - PAPILLARY UROTHELIAL CARCINOMA, HIGH-GRADE (WHO/ISUP 2004).  - NON-INVASIVE.  - MUSCULARIS PROPRIA IS NOT PRESENT FOR EVALUATION.   # A.fib [id number]; Dr.Agbor- awaiting 2 d ecoh-waiting on anti-coag]   Cancer of overlapping sites of bladder ([place])  01/11/2021 Initial Diagnosis   Cancer of overlapping sites of bladder ([place])   01/22/2021 Cancer Staging   Staging form: Urinary Bladder, AJCC 8th Edition - Clinical: Stage 0a (cTa, cN0, cM0) - Signed by Cammie Sickle, MD on 01/22/2021   01/30/2021 -  Chemotherapy    Patient is on Treatment Plan: BLADDER GEMCITABINE Q7D          HISTORY OF PRESENTING ILLNESS:  Francis Jones 70 y.o.  male with recurrent noninvasive bladder cancer is here to proceed with adjuvant intravesical chemotherapy.  In the interim patient underwent 2D echo for A. fib.  He is awaiting a stress test tomorrow.  He is not on anticoagulation.  Review of Systems  Constitutional:  Negative for chills, diaphoresis, fever, malaise/fatigue and weight loss.  HENT:  Negative for nosebleeds and sore throat.   Eyes:  Negative for double vision.  Respiratory:  Negative for cough, hemoptysis, sputum production, shortness of breath and wheezing.   Cardiovascular:  Negative for chest pain, palpitations,  orthopnea and leg swelling.  Gastrointestinal:  Negative for abdominal pain, blood in stool, constipation, diarrhea, heartburn, melena, nausea and vomiting.  Genitourinary:  Negative for dysuria, frequency and urgency.  Musculoskeletal:  Negative for back pain and joint pain.  Skin: Negative.  Negative for itching and rash.  Neurological:  Negative for dizziness, tingling, focal weakness, weakness and headaches.  Endo/Heme/Allergies:  Does not bruise/bleed easily.  Psychiatric/Behavioral:  Negative for depression. The patient is not nervous/anxious and does not have insomnia.     MEDICAL HISTORY:  Past Medical History:  Diagnosis Date   Atrial fibrillation ([place]) 12/16/2020   Bladder cancer ([place])    Dyspnea    Medical history non-contributory    PONV (postoperative nausea and vomiting)    Urothelial carcinoma of bladder ([place])     SURGICAL HISTORY: Past Surgical History:  Procedure Laterality Date   APPENDECTOMY  1973   CYSTOSCOPY WITH BIOPSY N/A 12/27/2020   Procedure: CYSTOSCOPY WITH BLADDER BIOPSY;  Surgeon: Abbie Sons, MD;  Location: [facility];  Service: Urology;  Laterality: N/A;   CYSTOSCOPY WITH FULGERATION N/A 12/27/2020   Procedure: CYSTOSCOPY WITH FULGERATION;  Surgeon: Abbie Sons, MD;  Location: [facility];  Service: Urology;  Laterality: N/A;   CYSTOSCOPY WITH STENT PLACEMENT Right 02/04/2018   Procedure: CYSTOSCOPY;  Surgeon: Abbie Sons, MD;  Location: [facility];  Service: Urology;  Laterality: Right;   FINGER SURGERY Left 2001   titanium in left 5th finger   HERNIA REPAIR  2000   SHOULDER SURGERY Left 2009   TRANSURETHRAL RESECTION OF BLADDER TUMOR N/A 02/04/2018  Procedure: TRANSURETHRAL RESECTION OF BLADDER TUMOR (TURBT) with gemcitabine;  Surgeon: Abbie Sons, MD;  Location: [facility];  Service: Urology;  Laterality: N/A;   TRANSURETHRAL RESECTION OF BLADDER TUMOR N/A 10/13/2019   Procedure: TRANSURETHRAL RESECTION OF BLADDER TUMOR (TURBT) with  gemcitabine;  Surgeon: Abbie Sons, MD;  Location: [facility];  Service: Urology;  Laterality: N/A;   VASECTOMY  1987    SOCIAL HISTORY: Social History   Socioeconomic History   Marital status: Married    Spouse name: Pricilla Holm   Number of children: Not on file   Years of education: Not on file   Highest education level: Not on file  Occupational History   Occupation: retired Engineer, drilling  Tobacco Use   Smoking status: Former    Types: Cigarettes    Quit date: 1979    Years since quitting: 43.6   Smokeless tobacco: Never  Vaping Use   Vaping Use: Never used  Substance and Sexual Activity   Alcohol use: Yes    Alcohol/week: 2.0 standard drinks    Types: 1 Glasses of wine, 1 Cans of beer per week    Comment: occasional   Drug use: Not Currently    Types: LSD, Opium    Comment: as a teenager   Sexual activity: Yes    Birth control/protection: None  Other Topics Concern   Not on file  Social History Narrative   Pt lives with wife; retd- Sports administrator- Conservation officer, [employer]. 5 years smoker- remote.    Social Determinants of Health   Financial Resource Strain: Not on file  Food Insecurity: Not on file  Transportation Needs: Not on file  Physical Activity: Not on file  Stress: Not on file  Social Connections: Not on file  Intimate Partner Violence: Not on file    FAMILY HISTORY: Family History  Problem Relation Age of Onset   Alzheimer's disease Father     ALLERGIES:  is allergic to hydrocodone-acetaminophen and oxycodone.  MEDICATIONS:  Current Outpatient Medications  Medication Sig Dispense Refill   acetaminophen (TYLENOL) 650 MG CR tablet Take 650-1,300 mg by mouth every 8 (eight) hours as needed for pain.     aspirin EC 81 MG tablet Take 1 tablet (81 mg total) by mouth daily. Swallow whole. 90 tablet 3   Carboxymethylcellul-Glycerin (REFRESH OPTIVE OP) Place 1 drop into both eyes daily as needed (dryness).     cholecalciferol (VITAMIN D3) 25 MCG (1000 UNIT) tablet  Take 1,000 Units by mouth daily.     ibuprofen (ADVIL,MOTRIN) 200 MG tablet Take 200-400 mg by mouth daily as needed for moderate pain.     loratadine (CLARITIN) 10 MG tablet Take 10 mg by mouth daily.     metoprolol succinate (TOPROL XL) 25 MG 24 hr tablet Take 1 tablet (25 mg total) by mouth daily. 30 tablet 5   MULTIPLE VITAMIN PO Take 1 tablet by mouth daily.      Omega-3 Fatty Acids (FISH OIL) 1200 MG CAPS Take 2,400 mg by mouth daily.     SUPER B COMPLEX/C CAPS Take 1 capsule by mouth daily.     No current facility-administered medications for this visit.      Marland Kitchen  PHYSICAL EXAMINATION: ECOG PERFORMANCE STATUS: 0 - Asymptomatic  Vitals:   01/30/21 0833  BP: 109/85  Pulse: 92  Resp: 16  Temp: 98.9 F (37.2 C)  SpO2: 98%   Filed Weights   01/30/21 0833  Weight: 272 lb 12.8 oz (123.7 kg)    Physical  Exam Vitals and nursing note reviewed.  Constitutional:      Comments: Alone walking independently.   HENT:     Head: Normocephalic and atraumatic.     Mouth/Throat:     Pharynx: Oropharynx is clear.  Eyes:     Extraocular Movements: Extraocular movements intact.     Pupils: Pupils are equal, round, and reactive to light.  Cardiovascular:     Rate and Rhythm: Normal rate. Rhythm irregular.  Pulmonary:     Comments: Decreased breath sounds bilaterally.  Abdominal:     Palpations: Abdomen is soft.  Musculoskeletal:        General: Normal range of motion.     Cervical back: Normal range of motion.  Skin:    General: Skin is warm.  Neurological:     General: No focal deficit present.     Mental Status: He is alert and oriented to person, place, and time.  Psychiatric:        Behavior: Behavior normal.        Judgment: Judgment normal.    LABORATORY DATA:  I have reviewed the data as listed Lab Results  Component Value Date   WBC 9.3 01/30/2021   HGB 17.0 01/30/2021   HCT 50.1 01/30/2021   MCV 88.0 01/30/2021   PLT 224 01/30/2021   Recent Labs     12/16/20 1320 01/30/21 0800  NA 137 139  K 3.6 4.6  CL 104 104  CO2 24 28  GLUCOSE 153* 137*  BUN 12 11  CREATININE 0.89 0.82  CALCIUM 9.0 9.0  GFRNONAA >60 >60  PROT  --  7.2  ALBUMIN  --  4.1  AST  --  20  ALT  --  22  ALKPHOS  --  66  BILITOT  --  0.9    RADIOGRAPHIC STUDIES: I have personally reviewed the radiological images as listed and agreed with the findings in the report. NM Myocar Multi W/Spect W/Wall Motion / EF  Result Date: 02/01/2021   Normal pharmacologic myocardial perfusion stress test without significant ischemia or scar.   Left ventricular systolic function is normal (LVEF 55-60%).   Mild coronary artery calcification and aortic atherosclerosis are noted.   Indeterminate fat and soft tissue density at the right lung base appears similar to the CT abdomen/pelvis from 01/15/2018, allowing for differences in technique.   Study is low risk.   ECHOCARDIOGRAM COMPLETE  Result Date: 01/11/2021    ECHOCARDIOGRAM REPORT   Patient Name:   Francis Jones Date of Exam: 01/10/2021 Medical Rec #:  [id number]       Height:       72.0 in Accession #:    [id number]      Weight:       275.0 lb Date of Birth:  17-Feb-1951        BSA:          2.439 m Patient Age:    72 years        BP:           118/78 mmHg Patient Gender: M               HR:           87 bpm. Exam Location:  [facility] Procedure: 2D Echo, Color Doppler, Cardiac Doppler and Intracardiac            Opacification Agent Indications:    I48.91 Atrial fibrillation; R06.00 Dyspnea on exertion  History:  Patient has no prior history of Echocardiogram examinations.                 Signs/Symptoms:Shortness of Breath.  Sonographer:    Charmayne Sheer Referring Phys: [id number] [facility]  Sonographer Comments: Suboptimal apical window and suboptimal subcostal window. Image acquisition challenging due to patient body habitus. IMPRESSIONS  1. Left ventricular ejection fraction, by estimation, is 55 to 60%. The left ventricle has  normal function. The left ventricle has no regional wall motion abnormalities. There is mild left ventricular hypertrophy. Left ventricular diastolic function could not be evaluated.  2. Right ventricular systolic function is normal. The right ventricular size is mildly enlarged. Tricuspid regurgitation signal is inadequate for assessing PA pressure.  3. The mitral valve is grossly normal. No evidence of mitral valve regurgitation. No evidence of mitral stenosis.  4. The aortic valve is tricuspid. There is mild thickening of the aortic valve. Aortic valve regurgitation is not visualized. Mild aortic valve sclerosis is present, with no evidence of aortic valve stenosis.  5. The inferior vena cava is normal in size with greater than 50% respiratory variability, suggesting right atrial pressure of 3 mmHg. FINDINGS  Left Ventricle: Left ventricular ejection fraction, by estimation, is 55 to 60%. The left ventricle has normal function. The left ventricle has no regional wall motion abnormalities. Definity contrast agent was given IV to delineate the left ventricular  endocardial borders. The left ventricular internal cavity size was normal in size. There is mild left ventricular hypertrophy. Left ventricular diastolic function could not be evaluated due to atrial fibrillation. Left ventricular diastolic function could not be evaluated. Right Ventricle: The right ventricular size is mildly enlarged. No increase in right ventricular wall thickness. Right ventricular systolic function is normal. Tricuspid regurgitation signal is inadequate for assessing PA pressure. Left Atrium: Left atrial size was normal in size. Right Atrium: Right atrial size was normal in size. Pericardium: There is no evidence of pericardial effusion. Mitral Valve: The mitral valve is grossly normal. No evidence of mitral valve regurgitation. No evidence of mitral valve stenosis. MV peak gradient, 2.8 mmHg. The mean mitral valve gradient is 1.0 mmHg.  Tricuspid Valve: The tricuspid valve is normal in structure. Tricuspid valve regurgitation is trivial. Aortic Valve: The aortic valve is tricuspid. There is mild thickening of the aortic valve. Aortic valve regurgitation is not visualized. Mild aortic valve sclerosis is present, with no evidence of aortic valve stenosis. Aortic valve mean gradient measures 4.0 mmHg. Aortic valve peak gradient measures 7.4 mmHg. Aortic valve area, by VTI measures 3.41 cm. Pulmonic Valve: The pulmonic valve was normal in structure. Pulmonic valve regurgitation is trivial. No evidence of pulmonic stenosis. Aorta: The aortic root is normal in size and structure. Pulmonary Artery: The pulmonary artery is of normal size. Venous: The inferior vena cava is normal in size with greater than 50% respiratory variability, suggesting right atrial pressure of 3 mmHg. IAS/Shunts: No atrial level shunt detected by color flow Doppler.  LEFT VENTRICLE PLAX 2D LVIDd:         4.90 cm      Diastology LVIDs:         3.70 cm      LV e' medial:    11.60 cm/s LV PW:         1.20 cm      LV E/e' medial:  6.4 LV IVS:        1.20 cm      LV e' lateral:   7.29  cm/s LVOT diam:     2.30 cm      LV E/e' lateral: 10.1 LV SV:         74 LV SV Index:   30 LVOT Area:     4.15 cm  LV Volumes (MOD) LV vol d, MOD A2C: 103.0 ml LV vol d, MOD A4C: 91.9 ml LV vol s, MOD A2C: 28.1 ml LV vol s, MOD A4C: 33.9 ml LV SV MOD A2C:     74.9 ml LV SV MOD A4C:     91.9 ml LV SV MOD BP:      68.4 ml RIGHT VENTRICLE RV Basal diam:  4.57 cm TAPSE (M-mode): 2.1 cm LEFT ATRIUM             Index       RIGHT ATRIUM           Index LA diam:        4.10 cm 1.68 cm/m  RA Area:     15.70 cm LA Vol (A2C):   51.0 ml 20.91 ml/m RA Volume:   40.00 ml  16.40 ml/m LA Vol (A4C):   49.1 ml 20.13 ml/m LA Biplane Vol: 50.4 ml 20.66 ml/m  AORTIC VALVE                   PULMONIC VALVE AV Area (Vmax):    3.09 cm    PV Vmax:       1.39 m/s AV Area (Vmean):   3.10 cm    PV Vmean:      82.200 cm/s AV  Area (VTI):     3.41 cm    PV VTI:        0.197 m AV Vmax:           136.00 cm/s PV Peak grad:  7.7 mmHg AV Vmean:          91.600 cm/s PV Mean grad:  3.0 mmHg AV VTI:            0.217 m AV Peak Grad:      7.4 mmHg AV Mean Grad:      4.0 mmHg LVOT Vmax:         101.00 cm/s LVOT Vmean:        68.300 cm/s LVOT VTI:          0.178 m LVOT/AV VTI ratio: 0.82  AORTA Ao Root diam: 3.50 cm MITRAL VALVE MV Area (PHT): 3.30 cm    SHUNTS MV Area VTI:   3.64 cm    Systemic VTI:  0.18 m MV Peak grad:  2.8 mmHg    Systemic Diam: 2.30 cm MV Mean grad:  1.0 mmHg MV Vmax:       0.84 m/s MV Vmean:      54.6 cm/s MV Decel Time: 230 msec MV E velocity: 73.70 cm/s Nelva Bush MD Electronically signed by Nelva Bush MD Signature Date/Time: 01/11/2021/5:39:24 AM    Final     ASSESSMENT & PLAN:   Cancer of overlapping sites of bladder (H[facility]) High-grade#Recurrent noninvasive bladder cancer-failed BCG.  Discussed the role of adjuvant intravesical chemotherapy to reduce the risk of recurrence and also to reduce the risk of progression to muscle invasive disease/and also development of new high-grade malignancy.  #I reviewed the logistics/process of intervesicular gemcitabine administration which is as follows:  # GEMCITABINE instilled into bladder via catheter [(dwell time of 1-2 hours].   # After 1 to 2 hours unclamp the catheter and allow the  urine and gemcitabine to drain  into the drainage bag; After one additional hour of diuresis, remove catheter.  #It is recommended that patient minimize oral fluids [especially containing caffeine] 6 to 8 hours prior to each treatment.  #In terms of side effects some patients can experience bladder irritation, with more frequent or painful urination,  urination at night and some blood or tissue in the urine.  Systemic side effects of chemotherapy do not occur as chemotherapy is not absorbed into the bloodstream.  #We will defer further cystoscopy/surveillance to the urologist,  Dr.Stoioff  #A. Fib [new diagnosis July 2022]-awaiting further cardiology work-up.  # DISPOSITION: # bladder chemo;weekly gemctiabine as planned # follow up as needed-Dr.B  All questions were answered. The patient knows to call the clinic with any problems, questions or concerns.    Cammie Sickle, MD 02/01/2021 7:06 PM

## 2021-01-30 NOTE — Progress Notes (Signed)
Patient tolerated first intravesical Gemcitabine treatment well. Denies any questions or concerns. Discharged home.

## 2021-01-30 NOTE — Progress Notes (Signed)
Would like clarification on what he is getting done today.

## 2021-01-31 ENCOUNTER — Encounter
Admission: RE | Admit: 2021-01-31 | Discharge: 2021-01-31 | Disposition: A | Discharge status: Home / Self Care | Payer: Medicare Other | Source: Ambulatory Visit | Attending: Cardiology | Admitting: Cardiology

## 2021-01-31 ENCOUNTER — Other Ambulatory Visit: Payer: Self-pay

## 2021-01-31 DIAGNOSIS — R0609 Other forms of dyspnea: Secondary | ICD-10-CM

## 2021-01-31 DIAGNOSIS — R0600 Dyspnea, unspecified: Secondary | ICD-10-CM | POA: Insufficient documentation

## 2021-01-31 MED ORDER — TECHNETIUM TC 99M TETROFOSMIN IV KIT
10.0000 | PACK | Freq: Once | INTRAVENOUS | Status: AC | PRN
  Administered 2021-01-31: 10.2 via INTRAVENOUS

## 2021-01-31 MED ORDER — REGADENOSON 0.4 MG/5ML IV SOLN
0.4000 mg | Freq: Once | INTRAVENOUS | Status: AC
  Administered 2021-01-31: 0.4 mg via INTRAVENOUS
  Filled 2021-01-31: qty 5

## 2021-01-31 MED ORDER — TECHNETIUM TC 99M TETROFOSMIN IV KIT
32.9900 | PACK | Freq: Once | INTRAVENOUS | Status: AC | PRN
  Administered 2021-01-31: 32.99 via INTRAVENOUS

## 2021-02-01 ENCOUNTER — Encounter: Payer: Self-pay | Admitting: Internal Medicine

## 2021-02-01 LAB — NM MYOCAR MULTI W/SPECT W/WALL MOTION / EF
Base ST Depression (mm): 0 mm
LV dias vol: 79 mL (ref 62–150)
LV sys vol: 32 mL
MPHR: 150 {beats}/min
Nuc Stress EF: 59 %
Peak HR: 89 {beats}/min
Percent HR: 59.3 %
Rest HR: 82 {beats}/min
Rest Nuclear Isotope Dose: 10.2 mCi
SDS: 0
SRS: 1
SSS: 0
ST Depression (mm): 0 mm
Stress Nuclear Isotope Dose: 33 mCi
TID: 1.14

## 2021-02-06 ENCOUNTER — Inpatient Hospital Stay: Payer: Medicare Other

## 2021-02-06 ENCOUNTER — Other Ambulatory Visit: Payer: Self-pay

## 2021-02-06 ENCOUNTER — Telehealth: Payer: Self-pay

## 2021-02-06 ENCOUNTER — Other Ambulatory Visit: Payer: Self-pay | Admitting: *Deleted

## 2021-02-06 ENCOUNTER — Ambulatory Visit (INDEPENDENT_AMBULATORY_CARE_PROVIDER_SITE_OTHER): Payer: Medicare Other | Admitting: Physician Assistant

## 2021-02-06 VITALS — BP 120/82 | HR 79 | Temp 97.0°F | Resp 17

## 2021-02-06 DIAGNOSIS — C678 Malignant neoplasm of overlapping sites of bladder: Secondary | ICD-10-CM

## 2021-02-06 DIAGNOSIS — I4891 Unspecified atrial fibrillation: Secondary | ICD-10-CM | POA: Diagnosis not present

## 2021-02-06 DIAGNOSIS — Z5111 Encounter for antineoplastic chemotherapy: Secondary | ICD-10-CM | POA: Diagnosis not present

## 2021-02-06 DIAGNOSIS — C679 Malignant neoplasm of bladder, unspecified: Secondary | ICD-10-CM | POA: Diagnosis not present

## 2021-02-06 DIAGNOSIS — E669 Obesity, unspecified: Secondary | ICD-10-CM | POA: Diagnosis not present

## 2021-02-06 LAB — URINALYSIS, COMPLETE (UACMP) WITH MICROSCOPIC
Bilirubin Urine: NEGATIVE
Glucose, UA: NEGATIVE mg/dL
Hgb urine dipstick: NEGATIVE
Ketones, ur: 5 mg/dL — AB
Leukocytes,Ua: NEGATIVE
Nitrite: NEGATIVE
Protein, ur: NEGATIVE mg/dL
Specific Gravity, Urine: 1.024 (ref 1.005–1.030)
Squamous Epithelial / HPF: NONE SEEN (ref 0–5)
pH: 5 (ref 5.0–8.0)

## 2021-02-06 LAB — COMPREHENSIVE METABOLIC PANEL
ALT: 22 U/L (ref 0–44)
AST: 23 U/L (ref 15–41)
Albumin: 4.1 g/dL (ref 3.5–5.0)
Alkaline Phosphatase: 72 U/L (ref 38–126)
Anion gap: 9 (ref 5–15)
BUN: 13 mg/dL (ref 8–23)
CO2: 27 mmol/L (ref 22–32)
Calcium: 9.3 mg/dL (ref 8.9–10.3)
Chloride: 104 mmol/L (ref 98–111)
Creatinine, Ser: 0.96 mg/dL (ref 0.61–1.24)
GFR, Estimated: >60 mL/min (ref >60)
Glucose, Bld: 163 mg/dL — ABNORMAL HIGH (ref 70–99)
Potassium: 4.1 mmol/L (ref 3.5–5.1)
Sodium: 140 mmol/L (ref 135–145)
Total Bilirubin: 0.6 mg/dL (ref 0.3–1.2)
Total Protein: 7.1 g/dL (ref 6.5–8.1)

## 2021-02-06 LAB — CBC WITH DIFFERENTIAL/PLATELET
Abs Immature Granulocytes: 0.03 10*3/uL (ref 0.00–0.07)
Basophils Absolute: 0.1 10*3/uL (ref 0.0–0.1)
Basophils Relative: 1 %
Eosinophils Absolute: 0.5 10*3/uL (ref 0.0–0.5)
Eosinophils Relative: 6 %
HCT: 50.3 % (ref 39.0–52.0)
Hemoglobin: 17.1 g/dL — ABNORMAL HIGH (ref 13.0–17.0)
Immature Granulocytes: 0 %
Lymphocytes Relative: 20 %
Lymphs Abs: 1.6 10*3/uL (ref 0.7–4.0)
MCH: 30.4 pg (ref 26.0–34.0)
MCHC: 34 g/dL (ref 30.0–36.0)
MCV: 89.3 fL (ref 80.0–100.0)
Monocytes Absolute: 0.5 10*3/uL (ref 0.1–1.0)
Monocytes Relative: 6 %
Neutro Abs: 5.5 10*3/uL (ref 1.7–7.7)
Neutrophils Relative %: 67 %
Platelets: 230 10*3/uL (ref 150–400)
RBC: 5.63 MIL/uL (ref 4.22–5.81)
RDW: 12.4 % (ref 11.5–15.5)
WBC: 8.2 10*3/uL (ref 4.0–10.5)
nRBC: 0 % (ref 0.0–0.2)

## 2021-02-06 MED ORDER — GEMCITABINE CHEMO FOR BLADDER INSTILLATION 2000 MG
2000.0000 mg | Freq: Once | INTRAVENOUS | Status: AC
  Administered 2021-02-06: 2000 mg via INTRAVESICAL
  Filled 2021-02-06: qty 52.6

## 2021-02-06 MED ORDER — PROCHLORPERAZINE MALEATE 10 MG PO TABS
10.0000 mg | ORAL_TABLET | Freq: Once | ORAL | Status: AC
  Administered 2021-02-06: 10 mg via ORAL
  Filled 2021-02-06: qty 1

## 2021-02-06 NOTE — Telephone Encounter (Signed)
Pt LM on triage line stating that he would like to speak with Sam, in regards to his visit today.

## 2021-02-06 NOTE — Progress Notes (Signed)
Simple Catheter Placement  Due to bladder cancer patient is present today for a foley cath placement in advance of scheduled Gemcitabine intravesical instillation at the cancer center.  Patient was cleaned and prepped in a sterile fashion with betadine. A 16 FR coude foley catheter was inserted and catheter was immediately plugged.  The balloon was filled with 10cc of sterile water.  Catheter to be removed by cancer center nursing staff after instillation.  Performed by: Debroah Loop, PA-C   Additional notes/ Follow up: Scheduled patient for pre-instillation catheter placement in our clinic for the duration of his treatment series.

## 2021-02-06 NOTE — Progress Notes (Signed)
1115: 3 nurses attempted to insert Foley Catheter and was unsuccessful. MD aware and Urology made aware. Per Debroah Loop PA, pt needs to report to the urology office to insert a Coude Catheter for treatment today, pt to then report to [facility] with foley in place for treatment.    1225: Pt returns to clinic with foley in place. Foley bag attached to catheter to drain urine.   1235: foley clamped and Gemzar inserted per order [id number]: foley unclamped 1250: Foley removed. Pt tolerated procedure well.  Pt stable at discharge.

## 2021-02-06 NOTE — Telephone Encounter (Signed)
I just spoke to the patient via telephone.  He wonders if there is an opportunity to provide patient feedback to the hospital regarding its policy limiting RN capabilities to insert coude catheters.  I told him that I would look into this and update him when I have more information.

## 2021-02-07 ENCOUNTER — Other Ambulatory Visit: Payer: Self-pay | Admitting: Internal Medicine

## 2021-02-10 ENCOUNTER — Other Ambulatory Visit: Payer: Self-pay | Admitting: Internal Medicine

## 2021-02-14 ENCOUNTER — Other Ambulatory Visit: Payer: Self-pay

## 2021-02-14 ENCOUNTER — Inpatient Hospital Stay: Payer: Medicare Other

## 2021-02-14 ENCOUNTER — Inpatient Hospital Stay: Payer: Medicare Other | Attending: Internal Medicine

## 2021-02-14 ENCOUNTER — Ambulatory Visit (INDEPENDENT_AMBULATORY_CARE_PROVIDER_SITE_OTHER): Payer: Medicare Other | Admitting: Physician Assistant

## 2021-02-14 VITALS — BP 124/86 | HR 94 | Temp 96.2°F | Resp 22

## 2021-02-14 DIAGNOSIS — C678 Malignant neoplasm of overlapping sites of bladder: Secondary | ICD-10-CM | POA: Diagnosis not present

## 2021-02-14 DIAGNOSIS — Z5111 Encounter for antineoplastic chemotherapy: Secondary | ICD-10-CM | POA: Diagnosis not present

## 2021-02-14 DIAGNOSIS — C679 Malignant neoplasm of bladder, unspecified: Secondary | ICD-10-CM | POA: Diagnosis not present

## 2021-02-14 LAB — URINALYSIS, COMPLETE (UACMP) WITH MICROSCOPIC
Bacteria, UA: NONE SEEN
Bilirubin Urine: NEGATIVE
Glucose, UA: NEGATIVE mg/dL
Ketones, ur: NEGATIVE mg/dL
Leukocytes,Ua: NEGATIVE
Nitrite: NEGATIVE
Protein, ur: NEGATIVE mg/dL
Specific Gravity, Urine: 1.025 (ref 1.005–1.030)
Squamous Epithelial / HPF: NONE SEEN (ref 0–5)
pH: 5 (ref 5.0–8.0)

## 2021-02-14 MED ORDER — GEMCITABINE CHEMO FOR BLADDER INSTILLATION 2000 MG
2000.0000 mg | Freq: Once | INTRAVENOUS | Status: AC
  Administered 2021-02-14: 2000 mg via INTRAVESICAL
  Filled 2021-02-14: qty 52.6

## 2021-02-14 MED ORDER — PROCHLORPERAZINE MALEATE 10 MG PO TABS: 10.0000 mg | ORAL_TABLET | Freq: Once | ORAL | Status: DC

## 2021-02-14 NOTE — Progress Notes (Signed)
Simple Catheter Placement  Due to bladder cancer patient is present today for a foley cath placement in advance of scheduled gemcitabine intravesical instillation at the cancer center.  Patient was cleaned and prepped in a sterile fashion with betadine. A 16 FR coud foley catheter was inserted, urine return was noted, and catheter was plugged. The balloon was filled with 10cc of sterile water.  Catheter to be removed by cancer center nursing staff after instillation later this morning.  Performed by: Debroah Loop, PA-C   Additional notes/ Follow up: 1 week for Foley placement prior to instillation at the cancer cancer.

## 2021-02-14 NOTE — Patient Instructions (Signed)
C[facility] CENTER [place] [facility]  Discharge Instructions: Thank you for choosing [facility] to provide your oncology and hematology care.  If you have a lab appointment with the Cancer Center, please go directly to the Cancer Center and check in at the registration area.  Wear comfortable clothing and clothing appropriate for easy access to any Portacath or PICC line.   We strive to give you quality time with your provider. You may need to reschedule your appointment if you arrive late (15 or more minutes).  Arriving late affects you and other patients whose appointments are after yours.  Also, if you miss three or more appointments without notifying the office, you may be dismissed from the clinic at the provider's discretion.      For prescription refill requests, have your pharmacy contact our office and allow 72 hours for refills to be completed.    Today you received the following chemotherapy and/or immunotherapy agents - gemcitabine      To help prevent nausea and vomiting after your treatment, we encourage you to take your nausea medication as directed.  BELOW ARE SYMPTOMS THAT SHOULD BE REPORTED IMMEDIATELY: *FEVER GREATER THAN 100.4 F (38 C) OR HIGHER *CHILLS OR SWEATING *NAUSEA AND VOMITING THAT IS NOT CONTROLLED WITH YOUR NAUSEA MEDICATION *UNUSUAL SHORTNESS OF BREATH *UNUSUAL BRUISING OR BLEEDING *URINARY PROBLEMS (pain or burning when urinating, or frequent urination) *BOWEL PROBLEMS (unusual diarrhea, constipation, pain near the anus) TENDERNESS IN MOUTH AND THROAT WITH OR WITHOUT PRESENCE OF ULCERS (sore throat, sores in mouth, or a toothache) UNUSUAL RASH, SWELLING OR PAIN  UNUSUAL VAGINAL DISCHARGE OR ITCHING   Items with * indicate a potential emergency and should be followed up as soon as possible or go to the Emergency Department if any problems should occur.  Please show the CHEMOTHERAPY ALERT CARD or IMMUNOTHERAPY ALERT CARD at  check-in to the Emergency Department and triage nurse.  Should you have questions after your visit or need to cancel or reschedule your appointment, please contact [facility] [place] [facility]  [phone] and follow the prompts.  Office hours are 8:00 a.m. to 4:30 p.m. Monday - Friday. Please note that voicemails left after 4:00 p.m. may not be returned until the following business day.  We are closed weekends and major holidays. You have access to a nurse at all times for urgent questions. Please call the main number to the clinic [phone] and follow the prompts.  For any non-urgent questions, you may also contact your provider using MyChart. We now offer e-Visits for anyone 18 and older to request care online for non-urgent symptoms. For details visit [email or web address].   Also download the MyChart app! Go to the app store, search "MyChart", open the app, select [facility], and log in with your MyChart username and password.  Due to Covid, a mask is required upon entering the hospital/clinic. If you do not have a mask, one will be given to you upon arrival. For doctor visits, patients may have 1 support person aged 18 or older with them. For treatment visits, patients cannot have anyone with them due to current Covid guidelines and our immunocompromised population.   Gemcitabine injection What is this medication? GEMCITABINE (jem SYE ta been) is a chemotherapy drug. This medicine is used to treat many types of cancer like breast cancer, lung cancer, pancreatic cancer, and ovarian cancer. This medicine may be used for other purposes; ask your health care provider or pharmacist if you have questions.   COMMON BRAND NAME(S): Gemzar, Infugem What should I tell my care team before I take this medication? They need to know if you have any of these conditions: blood disorders infection kidney disease liver disease lung or breathing disease, like asthma recent or ongoing  radiation therapy an unusual or allergic reaction to gemcitabine, other chemotherapy, other medicines, foods, dyes, or preservatives pregnant or trying to get pregnant breast-feeding How should I use this medication? This drug is given as an infusion into a vein. It is administered in a hospital or clinic by a specially trained health care professional. Talk to your pediatrician regarding the use of this medicine in children. Special care may be needed. Overdosage: If you think you have taken too much of this medicine contact a poison control center or emergency room at once. NOTE: This medicine is only for you. Do not share this medicine with others. What if I miss a dose? It is important not to miss your dose. Call your doctor or health care professional if you are unable to keep an appointment. What may interact with this medication? medicines to increase blood counts like filgrastim, pegfilgrastim, sargramostim some other chemotherapy drugs like cisplatin vaccines Talk to your doctor or health care professional before taking any of these medicines: acetaminophen aspirin ibuprofen ketoprofen naproxen This list may not describe all possible interactions. Give your health care provider a list of all the medicines, herbs, non-prescription drugs, or dietary supplements you use. Also tell them if you smoke, drink alcohol, or use illegal drugs. Some items may interact with your medicine. What should I watch for while using this medication? Visit your doctor for checks on your progress. This drug may make you feel generally unwell. This is not uncommon, as chemotherapy can affect healthy cells as well as cancer cells. Report any side effects. Continue your course of treatment even though you feel ill unless your doctor tells you to stop. In some cases, you may be given additional medicines to help with side effects. Follow all directions for their use. Call your doctor or health care  professional for advice if you get a fever, chills or sore throat, or other symptoms of a cold or flu. Do not treat yourself. This drug decreases your body's ability to fight infections. Try to avoid being around people who are sick. This medicine may increase your risk to bruise or bleed. Call your doctor or health care professional if you notice any unusual bleeding. Be careful brushing and flossing your teeth or using a toothpick because you may get an infection or bleed more easily. If you have any dental work done, tell your dentist you are receiving this medicine. Avoid taking products that contain aspirin, acetaminophen, ibuprofen, naproxen, or ketoprofen unless instructed by your doctor. These medicines may hide a fever. Do not become pregnant while taking this medicine or for 6 months after stopping it. Women should inform their doctor if they wish to become pregnant or think they might be pregnant. Men should not father a child while taking this medicine and for 3 months after stopping it. There is a potential for serious side effects to an unborn child. Talk to your health care professional or pharmacist for more information. Do not breast-feed an infant while taking this medicine or for at least 1 week after stopping it. Men should inform their doctors if they wish to father a child. This medicine may lower sperm counts. Talk with your doctor or health care professional if you are concerned   about your fertility. What side effects may I notice from receiving this medication? Side effects that you should report to your doctor or health care professional as soon as possible: allergic reactions like skin rash, itching or hives, swelling of the face, lips, or tongue breathing problems pain, redness, or irritation at site where injected signs and symptoms of a dangerous change in heartbeat or heart rhythm like chest pain; dizziness; fast or irregular heartbeat; palpitations; feeling faint or  lightheaded, falls; breathing problems signs of decreased platelets or bleeding - bruising, pinpoint red spots on the skin, black, tarry stools, blood in the urine signs of decreased red blood cells - unusually weak or tired, feeling faint or lightheaded, falls signs of infection - fever or chills, cough, sore throat, pain or difficulty passing urine signs and symptoms of kidney injury like trouble passing urine or change in the amount of urine signs and symptoms of liver injury like dark yellow or brown urine; general ill feeling or flu-like symptoms; light-colored stools; loss of appetite; nausea; right upper belly pain; unusually weak or tired; yellowing of the eyes or skin swelling of ankles, feet, hands Side effects that usually do not require medical attention (report to your doctor or health care professional if they continue or are bothersome): constipation diarrhea hair loss loss of appetite nausea rash vomiting This list may not describe all possible side effects. Call your doctor for medical advice about side effects. You may report side effects to FDA at 1-800-FDA-1088. Where should I keep my medication? This drug is given in a hospital or clinic and will not be stored at home. NOTE: This sheet is a summary. It may not cover all possible information. If you have questions about this medicine, talk to your doctor, pharmacist, or health care provider.  2022 Elsevier/Gold Standard (2017-08-21 18:06:11)  

## 2021-02-14 NOTE — Progress Notes (Signed)
Pt tolerated gemzar intravesicular administration well today with no problems or complaints.  Gemzar indwelled for 60 minutes as ordered.  Catheter removed per policy and pt left infusion suite stable and ambulatory.

## 2021-02-15 ENCOUNTER — Encounter: Payer: Self-pay | Admitting: Internal Medicine

## 2021-02-20 ENCOUNTER — Other Ambulatory Visit: Payer: Self-pay

## 2021-02-20 ENCOUNTER — Inpatient Hospital Stay: Payer: Medicare Other

## 2021-02-20 ENCOUNTER — Ambulatory Visit (INDEPENDENT_AMBULATORY_CARE_PROVIDER_SITE_OTHER): Payer: Medicare Other | Admitting: Physician Assistant

## 2021-02-20 VITALS — BP 152/79 | HR 96 | Temp 97.9°F | Resp 18

## 2021-02-20 DIAGNOSIS — C679 Malignant neoplasm of bladder, unspecified: Secondary | ICD-10-CM | POA: Diagnosis not present

## 2021-02-20 DIAGNOSIS — C678 Malignant neoplasm of overlapping sites of bladder: Secondary | ICD-10-CM | POA: Diagnosis not present

## 2021-02-20 DIAGNOSIS — Z5111 Encounter for antineoplastic chemotherapy: Secondary | ICD-10-CM | POA: Diagnosis not present

## 2021-02-20 LAB — URINALYSIS, COMPLETE (UACMP) WITH MICROSCOPIC
Bacteria, UA: NONE SEEN
Bilirubin Urine: NEGATIVE
Glucose, UA: NEGATIVE mg/dL
Ketones, ur: NEGATIVE mg/dL
Leukocytes,Ua: NEGATIVE
Nitrite: NEGATIVE
Protein, ur: NEGATIVE mg/dL
Specific Gravity, Urine: >1.03 — ABNORMAL HIGH (ref 1.005–1.030)
pH: 5.5 (ref 5.0–8.0)

## 2021-02-20 MED ORDER — PROCHLORPERAZINE MALEATE 10 MG PO TABS: 10.0000 mg | ORAL_TABLET | Freq: Once | ORAL | Status: DC

## 2021-02-20 MED ORDER — GEMCITABINE CHEMO FOR BLADDER INSTILLATION 2000 MG
2000.0000 mg | Freq: Once | INTRAVENOUS | Status: AC
  Administered 2021-02-20: 2000 mg via INTRAVESICAL
  Filled 2021-02-20: qty 52.6

## 2021-02-20 NOTE — Patient Instructions (Signed)
CANCER CENTER [place] [facility]  Discharge Instructions: Thank you for choosing [facility] to provide your oncology and hematology care.  If you have a lab appointment with the Cancer Center, please go directly to the Cancer Center and check in at the registration area.  Wear comfortable clothing and clothing appropriate for easy access to any Portacath or PICC line.   We strive to give you quality time with your provider. You may need to reschedule your appointment if you arrive late (15 or more minutes).  Arriving late affects you and other patients whose appointments are after yours.  Also, if you miss three or more appointments without notifying the office, you may be dismissed from the clinic at the provider's discretion.      For prescription refill requests, have your pharmacy contact our office and allow 72 hours for refills to be completed.    Today you received the following chemotherapy and/or immunotherapy agents Gemzar   To help prevent nausea and vomiting after your treatment, we encourage you to take your nausea medication as directed.  BELOW ARE SYMPTOMS THAT SHOULD BE REPORTED IMMEDIATELY: *FEVER GREATER THAN 100.4 F (38 C) OR HIGHER *CHILLS OR SWEATING *NAUSEA AND VOMITING THAT IS NOT CONTROLLED WITH YOUR NAUSEA MEDICATION *UNUSUAL SHORTNESS OF BREATH *UNUSUAL BRUISING OR BLEEDING *URINARY PROBLEMS (pain or burning when urinating, or frequent urination) *BOWEL PROBLEMS (unusual diarrhea, constipation, pain near the anus) TENDERNESS IN MOUTH AND THROAT WITH OR WITHOUT PRESENCE OF ULCERS (sore throat, sores in mouth, or a toothache) UNUSUAL RASH, SWELLING OR PAIN  UNUSUAL VAGINAL DISCHARGE OR ITCHING   Items with * indicate a potential emergency and should be followed up as soon as possible or go to the Emergency Department if any problems should occur.  Please show the CHEMOTHERAPY ALERT CARD or IMMUNOTHERAPY ALERT CARD at check-in to the  Emergency Department and triage nurse.  Should you have questions after your visit or need to cancel or reschedule your appointment, please contact [facility] [place]  [phone] and follow the prompts.  Office hours are 8:00 a.m. to 4:30 p.m. Monday - Friday. Please note that voicemails left after 4:00 p.m. may not be returned until the following business day.  We are closed weekends and major holidays. You have access to a nurse at all times for urgent questions. Please call the main number to the clinic [phone] and follow the prompts.  For any non-urgent questions, you may also contact your provider using MyChart. We now offer e-Visits for anyone 18 and older to request care online for non-urgent symptoms. For details visit [email or web address].   Also download the MyChart app! Go to the app store, search "MyChart", open the app, select [facility], and log in with your MyChart username and password.  Due to Covid, a mask is required upon entering the hospital/clinic. If you do not have a mask, one will be given to you upon arrival. For doctor visits, patients may have 1 support person aged 18 or older with them. For treatment visits, patients cannot have anyone with them due to current Covid guidelines and our immunocompromised population.  

## 2021-02-20 NOTE — Progress Notes (Signed)
Simple Catheter Placement  Due to bladder cancer patient is present today for a foley cath placement in advance of scheduled gemcitabine intravesical instillation at the cancer center.  Patient was cleaned and prepped in a sterile fashion with betadine. A 16 FR coud foley catheter was inserted, urine return was noted  67m, urine was yellow in color.  The balloon was filled with 10cc of sterile water and the catheter was plugged.  Catheter to be removed by cancer center nursing staff after instillation later this morning.  Performed by: SDebroah Loop PA-C   Additional notes/ Follow up: 1 week for Foley placement prior to instillaiton

## 2021-02-24 DIAGNOSIS — Z23 Encounter for immunization: Secondary | ICD-10-CM | POA: Diagnosis not present

## 2021-02-27 ENCOUNTER — Other Ambulatory Visit: Payer: Self-pay

## 2021-02-27 ENCOUNTER — Ambulatory Visit (INDEPENDENT_AMBULATORY_CARE_PROVIDER_SITE_OTHER): Payer: Medicare Other | Admitting: Physician Assistant

## 2021-02-27 ENCOUNTER — Inpatient Hospital Stay: Payer: Medicare Other

## 2021-02-27 VITALS — BP 116/85 | HR 95 | Temp 97.7°F | Resp 18

## 2021-02-27 DIAGNOSIS — C679 Malignant neoplasm of bladder, unspecified: Secondary | ICD-10-CM | POA: Diagnosis not present

## 2021-02-27 DIAGNOSIS — C678 Malignant neoplasm of overlapping sites of bladder: Secondary | ICD-10-CM

## 2021-02-27 DIAGNOSIS — Z5111 Encounter for antineoplastic chemotherapy: Secondary | ICD-10-CM | POA: Diagnosis not present

## 2021-02-27 LAB — URINALYSIS, COMPLETE (UACMP) WITH MICROSCOPIC
Bacteria, UA: NONE SEEN
Bilirubin Urine: NEGATIVE
Glucose, UA: NEGATIVE mg/dL
Ketones, ur: NEGATIVE mg/dL
Leukocytes,Ua: NEGATIVE
Nitrite: NEGATIVE
Protein, ur: NEGATIVE mg/dL
Specific Gravity, Urine: >1.03 — ABNORMAL HIGH (ref 1.005–1.030)
pH: 5.5 (ref 5.0–8.0)

## 2021-02-27 MED ORDER — GEMCITABINE CHEMO FOR BLADDER INSTILLATION 2000 MG
2000.0000 mg | Freq: Once | INTRAVENOUS | Status: AC
  Administered 2021-02-27: 2000 mg via INTRAVESICAL
  Filled 2021-02-27: qty 52.6

## 2021-02-27 MED ORDER — PROCHLORPERAZINE MALEATE 10 MG PO TABS: 10.0000 mg | ORAL_TABLET | Freq: Once | ORAL | Status: DC

## 2021-02-27 NOTE — Progress Notes (Signed)
Simple Catheter Placement   Due to bladder cancer patient is present today for a foley cath placement in advance of scheduled gemcitabine intravesical instillation at the cancer center.  Patient was cleaned and prepped in a sterile fashion with betadine. A 16 FR coud foley catheter was inserted, urine return was noted  37ml, urine was yellow in color.  The balloon was filled with 10cc of sterile water and the catheter was plugged.  Catheter to be removed by cancer center nursing staff after instillation later this morning.   Performed by: Bradly Bienenstock, CMA    Additional notes/ Follow up: 1 week for Foley placement prior to instillaiton

## 2021-02-27 NOTE — Progress Notes (Signed)
Urethral Catheter placed by Urologist prior to coming in for treatment. Urinary bag attached to catheter in infusion.  Catheter unclamped at 1105.  Allowed to drain for approx. 20 minutes. Total urine 425cc. Catheter removed. Patient discharged home.

## 2021-02-27 NOTE — Patient Instructions (Signed)
[facility]  Discharge Instructions: Thank you for choosing [facility] to provide your oncology and hematology care.  If you have a lab appointment with the [facility], please go directly to the [facility] and check in at the registration area.  Wear comfortable clothing and clothing appropriate for easy access to any Portacath or PICC line.   We strive to give you quality time with your provider. You may need to reschedule your appointment if you arrive late (15 or more minutes).  Arriving late affects you and other patients whose appointments are after yours.  Also, if you miss three or more appointments without notifying the office, you may be dismissed from the clinic at the provider's discretion.      For prescription refill requests, have your pharmacy contact our office and allow 72 hours for refills to be completed.    Today you received the following chemotherapy and/or immunotherapy agents Gemcitabine      To help prevent nausea and vomiting after your treatment, we encourage you to take your nausea medication as directed.  BELOW ARE SYMPTOMS THAT SHOULD BE REPORTED IMMEDIATELY: *FEVER GREATER THAN 100.4 F (38 C) OR HIGHER *CHILLS OR SWEATING *NAUSEA AND VOMITING THAT IS NOT CONTROLLED WITH YOUR NAUSEA MEDICATION *UNUSUAL SHORTNESS OF BREATH *UNUSUAL BRUISING OR BLEEDING *URINARY PROBLEMS (pain or burning when urinating, or frequent urination) *BOWEL PROBLEMS (unusual diarrhea, constipation, pain near the anus) TENDERNESS IN MOUTH AND THROAT WITH OR WITHOUT PRESENCE OF ULCERS (sore throat, sores in mouth, or a toothache) UNUSUAL RASH, SWELLING OR PAIN  UNUSUAL VAGINAL DISCHARGE OR ITCHING   Items with * indicate a potential emergency and should be followed up as soon as possible or go to the Emergency Department if any problems should occur.  Please show the CHEMOTHERAPY ALERT CARD or IMMUNOTHERAPY ALERT CARD at check-in  to the Emergency Department and triage nurse.  Should you have questions after your visit or need to cancel or reschedule your appointment, please contact [facility]  [phone] and follow the prompts.  Office hours are 8:00 a.m. to 4:30 p.m. Monday - Friday. Please note that voicemails left after 4:00 p.m. may not be returned until the following business day.  We are closed weekends and major holidays. You have access to a nurse at all times for urgent questions. Please call the main number to the clinic [phone] and follow the prompts.  For any non-urgent questions, you may also contact your provider using MyChart. We now offer e-Visits for anyone 57 and older to request care online for non-urgent symptoms. For details visit [email or web address].   Also download the MyChart app! Go to the app store, search "MyChart", open the app, select [facility], and log in with your MyChart username and password.  Due to Covid, a mask is required upon entering the hospital/clinic. If you do not have a mask, one will be given to you upon arrival. For doctor visits, patients may have 1 support person aged 73 or older with them. For treatment visits, patients cannot have anyone with them due to current Covid guidelines and our immunocompromised population.

## 2021-03-01 ENCOUNTER — Inpatient Hospital Stay: Payer: Medicare Other | Admitting: *Deleted

## 2021-03-01 DIAGNOSIS — C679 Malignant neoplasm of bladder, unspecified: Secondary | ICD-10-CM

## 2021-03-01 NOTE — Progress Notes (Signed)
Multidisciplinary Oncology Council Documentation  Francis Jones was presented by our Francis Jones on 03/01/2021, which included representatives from:  Palliative Care Dietitian  Physical/Occupational Therapist Nurse Navigator Genetics Speech Therapist Survivorship RN Research RN  [name] currently presents with history of bladder cancer.  We reviewed previous medical and familial history, history of present illness, and recent lab results along with all available histopathologic and imaging studies. The Francis Jones considered available treatment options and made the following recommendations/referrals:  -No recommendations at this time.  The MOC is a meeting of clinicians from various specialty areas who evaluate and discuss patients for whom a multidisciplinary approach is being considered. Final determinations in the plan of care are those of the provider(s).   Today's extended care, comprehensive team conference, Francis Jones was not present for the discussion and was not examined.

## 2021-03-06 ENCOUNTER — Inpatient Hospital Stay: Payer: Medicare Other

## 2021-03-06 ENCOUNTER — Ambulatory Visit (INDEPENDENT_AMBULATORY_CARE_PROVIDER_SITE_OTHER): Payer: Medicare Other | Admitting: Physician Assistant

## 2021-03-06 ENCOUNTER — Other Ambulatory Visit: Payer: Self-pay

## 2021-03-06 VITALS — BP 136/77 | HR 96 | Temp 98.0°F | Resp 18

## 2021-03-06 DIAGNOSIS — C678 Malignant neoplasm of overlapping sites of bladder: Secondary | ICD-10-CM

## 2021-03-06 DIAGNOSIS — C679 Malignant neoplasm of bladder, unspecified: Secondary | ICD-10-CM | POA: Diagnosis not present

## 2021-03-06 DIAGNOSIS — Z5111 Encounter for antineoplastic chemotherapy: Secondary | ICD-10-CM | POA: Diagnosis not present

## 2021-03-06 LAB — URINALYSIS, COMPLETE (UACMP) WITH MICROSCOPIC
Bacteria, UA: NONE SEEN
Bilirubin Urine: NEGATIVE
Glucose, UA: NEGATIVE mg/dL
Ketones, ur: NEGATIVE mg/dL
Leukocytes,Ua: NEGATIVE
Nitrite: NEGATIVE
Protein, ur: NEGATIVE mg/dL
Specific Gravity, Urine: 1.017 (ref 1.005–1.030)
pH: 5 (ref 5.0–8.0)

## 2021-03-06 MED ORDER — GEMCITABINE CHEMO FOR BLADDER INSTILLATION 2000 MG
2000.0000 mg | Freq: Once | INTRAVENOUS | Status: AC
  Administered 2021-03-06: 2000 mg via INTRAVESICAL
  Filled 2021-03-06: qty 52.6

## 2021-03-06 NOTE — Progress Notes (Unsigned)
Pt tolerated procedure well, foley removed without difficulty

## 2021-03-06 NOTE — Progress Notes (Signed)
Due to bladder cancer patient is present today for a foley cath placement in advance of scheduled gemcitabine intravesical instillation at the cancer center.  Patient was cleaned and prepped in a sterile fashion with betadine. A 16 FR coud foley catheter was inserted, urine return was noted  83ml, urine was yellow in color.  The balloon was filled with 10cc of sterile water and the catheter was plugged.  Catheter to be removed by cancer center nursing staff after instillation later this morning.   Performed by: Bradly Bienenstock, CMA

## 2021-04-18 DIAGNOSIS — Z23 Encounter for immunization: Secondary | ICD-10-CM | POA: Diagnosis not present

## 2021-05-22 DIAGNOSIS — D225 Melanocytic nevi of trunk: Secondary | ICD-10-CM | POA: Diagnosis not present

## 2021-05-22 DIAGNOSIS — L821 Other seborrheic keratosis: Secondary | ICD-10-CM | POA: Diagnosis not present

## 2021-05-22 DIAGNOSIS — R208 Other disturbances of skin sensation: Secondary | ICD-10-CM | POA: Diagnosis not present

## 2021-05-22 DIAGNOSIS — L820 Inflamed seborrheic keratosis: Secondary | ICD-10-CM | POA: Diagnosis not present

## 2021-05-22 DIAGNOSIS — X32XXXA Exposure to sunlight, initial encounter: Secondary | ICD-10-CM | POA: Diagnosis not present

## 2021-05-22 DIAGNOSIS — D2262 Melanocytic nevi of left upper limb, including shoulder: Secondary | ICD-10-CM | POA: Diagnosis not present

## 2021-05-22 DIAGNOSIS — L570 Actinic keratosis: Secondary | ICD-10-CM | POA: Diagnosis not present

## 2021-05-22 DIAGNOSIS — D2261 Melanocytic nevi of right upper limb, including shoulder: Secondary | ICD-10-CM | POA: Diagnosis not present

## 2021-05-22 NOTE — Progress Notes (Signed)
Pt with a history of cardiovascular pathology in need to repeat ekg. ja

## 2021-06-13 ENCOUNTER — Other Ambulatory Visit: Payer: Self-pay

## 2021-06-13 DIAGNOSIS — I4891 Unspecified atrial fibrillation: Secondary | ICD-10-CM

## 2021-06-13 MED ORDER — METOPROLOL SUCCINATE ER 25 MG PO TB24: 25.0000 mg | ORAL_TABLET | Freq: Every day | ORAL | 5 refills | Status: DC

## 2021-07-31 ENCOUNTER — Ambulatory Visit (INDEPENDENT_AMBULATORY_CARE_PROVIDER_SITE_OTHER): Payer: Medicare Other | Admitting: Cardiology

## 2021-07-31 ENCOUNTER — Encounter: Payer: Self-pay | Admitting: Cardiology

## 2021-07-31 ENCOUNTER — Other Ambulatory Visit: Payer: Self-pay

## 2021-07-31 VITALS — BP 130/80 | HR 95 | Ht 73.0 in | Wt 278.0 lb

## 2021-07-31 DIAGNOSIS — I4819 Other persistent atrial fibrillation: Secondary | ICD-10-CM

## 2021-07-31 DIAGNOSIS — R0602 Shortness of breath: Secondary | ICD-10-CM

## 2021-07-31 DIAGNOSIS — M792 Neuralgia and neuritis, unspecified: Secondary | ICD-10-CM | POA: Diagnosis not present

## 2021-07-31 NOTE — Patient Instructions (Signed)
Medication Instructions:  - Your physician recommends that you continue on your current medications as directed. Please refer to the Current Medication list given to you today.  *If you need a refill on your cardiac medications before your next appointment, please call your pharmacy*   Lab Work: - none ordered  If you have labs (blood work) drawn today and your tests are completely normal, you will receive your results only by: [facility] (if you have MyChart) OR A paper copy in the mail If you have any lab test that is abnormal or we need to change your treatment, we will call you to review the results.   Testing/Procedures: - You have been referred to: Dr. Lars Mage (Electrophysiology) for further evaluation of your atrial fibrillation    Follow-Up: At [facility], you and your health needs are our priority.  As part of our continuing mission to provide you with exceptional heart care, we have created designated Provider Care Teams.  These Care Teams include your primary Cardiologist (physician) and Advanced Practice Providers (APPs -  Physician Assistants and Nurse Practitioners) who all work together to provide you with the care you need, when you need it.  We recommend signing up for the patient portal called "MyChart".  Sign up information is provided on this After Visit Summary.  MyChart is used to connect with patients for Virtual Visits (Telemedicine).  Patients are able to view lab/test results, encounter notes, upcoming appointments, etc.  Non-urgent messages can be sent to your provider as well.   To learn more about what you can do with MyChart, go to [email or web address].    Your next appointment:   6 month(s)  The format for your next appointment:   In Person  Provider:   Kate Sable, MD    Other Instructions N/a

## 2021-07-31 NOTE — Progress Notes (Signed)
Cardiology Office Note:    Date:  07/31/2021   ID:  Francis Jones, DOB 1951/06/04, MRN [id number]  PCP:  Jerrol Banana., MD   [facility] Providers Cardiologist:  Kate Sable, MD     Referring MD: Jerrol Banana.,*   Chief Complaint  Patient presents with   Other    6 month follow up -- Meds reviewed verbally with patient.     History of Present Illness:    Francis Jones is a 71 y.o. male with a hx of persistent atrial fibrillation, bladder cancer who presents for follow-up.    He is being seen for persistent A-fib.  Takes Toprol-XL, aspirin.  Denies any adverse effects from medications.  Anticoagulation and cardioversion previously discussed with patient, decided to hold off.  Underwent urological procedure/resection to manage bladder cancer, follows up every 3 to 6 months for cystoscopy with urology.    He had an episode of shooting pain down his left arm a few days ago, similar to when he had a bone spur in his shoulder.  Current other episode today.  He otherwise states feeling well, denies chest pain, denies dizziness.  Prior notes Echo 01/2021 EF 55 to 60% Lexiscan Myoview 03/2021, low risk study, no ischemia.   Past Medical History:  Diagnosis Date   Atrial fibrillation ([place]) 12/16/2020   Bladder cancer ([place])    Dyspnea    Medical history non-contributory    PONV (postoperative nausea and vomiting)    Urothelial carcinoma of bladder ([place])     Past Surgical History:  Procedure Laterality Date   APPENDECTOMY  1973   CYSTOSCOPY WITH BIOPSY N/A 12/27/2020   Procedure: CYSTOSCOPY WITH BLADDER BIOPSY;  Surgeon: Abbie Sons, MD;  Location: [facility];  Service: Urology;  Laterality: N/A;   CYSTOSCOPY WITH FULGERATION N/A 12/27/2020   Procedure: CYSTOSCOPY WITH FULGERATION;  Surgeon: Abbie Sons, MD;  Location: [facility];  Service: Urology;  Laterality: N/A;   CYSTOSCOPY WITH STENT PLACEMENT Right 02/04/2018   Procedure: CYSTOSCOPY;   Surgeon: Abbie Sons, MD;  Location: [facility];  Service: Urology;  Laterality: Right;   FINGER SURGERY Left 2001   titanium in left 5th finger   HERNIA REPAIR  2000   SHOULDER SURGERY Left 2009   TRANSURETHRAL RESECTION OF BLADDER TUMOR N/A 02/04/2018   Procedure: TRANSURETHRAL RESECTION OF BLADDER TUMOR (TURBT) with gemcitabine;  Surgeon: Abbie Sons, MD;  Location: [facility];  Service: Urology;  Laterality: N/A;   TRANSURETHRAL RESECTION OF BLADDER TUMOR N/A 10/13/2019   Procedure: TRANSURETHRAL RESECTION OF BLADDER TUMOR (TURBT) with gemcitabine;  Surgeon: Abbie Sons, MD;  Location: [facility];  Service: Urology;  Laterality: N/A;   VASECTOMY  1987    Current Medications: Current Meds  Medication Sig   acetaminophen (TYLENOL) 650 MG CR tablet Take 650-1,300 mg by mouth every 8 (eight) hours as needed for pain.   aspirin EC 81 MG tablet Take 1 tablet (81 mg total) by mouth daily. Swallow whole.   Carboxymethylcellul-Glycerin (REFRESH OPTIVE OP) Place 1 drop into both eyes daily as needed (dryness).   cholecalciferol (VITAMIN D3) 25 MCG (1000 UNIT) tablet Take 1,000 Units by mouth daily.   ibuprofen (ADVIL,MOTRIN) 200 MG tablet Take 200-400 mg by mouth daily as needed for moderate pain.   metoprolol succinate (TOPROL XL) 25 MG 24 hr tablet Take 1 tablet (25 mg total) by mouth daily.   MULTIPLE VITAMIN PO Take 1 tablet by mouth daily.  Omega-3 Fatty Acids (FISH OIL) 1200 MG CAPS Take 2,400 mg by mouth daily.   SUPER B COMPLEX/C CAPS Take 1 capsule by mouth daily.     Allergies:   Hydrocodone-acetaminophen and Oxycodone   Social History   Socioeconomic History   Marital status: Married    Spouse name: Pricilla Holm   Number of children: Not on file   Years of education: Not on file   Highest education level: Not on file  Occupational History   Occupation: retired Engineer, drilling  Tobacco Use   Smoking status: Former    Types: Cigarettes    Quit date: 1979     Years since quitting: 44.1   Smokeless tobacco: Never  Vaping Use   Vaping Use: Never used  Substance and Sexual Activity   Alcohol use: Yes    Alcohol/week: 2.0 standard drinks    Types: 1 Glasses of wine, 1 Cans of beer per week    Comment: occasional   Drug use: Not Currently    Types: LSD, Opium    Comment: as a teenager   Sexual activity: Yes    Birth control/protection: None  Other Topics Concern   Not on file  Social History Narrative   Pt lives with wife; retd- Sports administrator- Conservation officer, nature. 5 years smoker- remote.    Social Determinants of Health   Financial Resource Strain: Not on file  Food Insecurity: Not on file  Transportation Needs: Not on file  Physical Activity: Not on file  Stress: Not on file  Social Connections: Not on file     Family History: The patient's family history includes Alzheimer's disease in his father.  ROS:   Please see the history of present illness.     All other systems reviewed and are negative.  EKGs/Labs/Other Studies Reviewed:    The following studies were reviewed today:   EKG:  EKG is ordered today.  EKG shows atrial fibrillation, heart rate 95.  Recent Labs: 02/06/2021: ALT 22; BUN 13; Creatinine, Ser 0.96; Hemoglobin 17.1; Platelets 230; Potassium 4.1; Sodium 140  Recent Lipid Panel No results found for: CHOL, TRIG, HDL, CHOLHDL, VLDL, LDLCALC, LDLDIRECT   Risk Assessment/Calculations:          Physical Exam:    VS:  BP 130/80 (BP Location: Left Arm, Patient Position: Sitting, Cuff Size: Normal)    Pulse 95    Ht 6\' 1"  (1.854 m)    Wt 278 lb (126.1 kg)    SpO2 97%    BMI 36.68 kg/m     Wt Readings from Last 3 Encounters:  07/31/21 278 lb (126.1 kg)  01/30/21 272 lb 12.8 oz (123.7 kg)  01/26/21 272 lb (123.4 kg)     GEN:  Well nourished, well developed in no acute distress HEENT: Normal NECK: No JVD; No carotid bruits LYMPHATICS: No lymphadenopathy CARDIAC: Irregular irregular, no murmurs, rubs, gallops RESPIRATORY:   Clear to auscultation without rales, wheezing or rhonchi  ABDOMEN: Soft, non-tender, non-distended MUSCULOSKELETAL:  No edema; No deformity  SKIN: Warm and dry NEUROLOGIC:  Alert and oriented x 3 PSYCHIATRIC:  Normal affect   ASSESSMENT:    1. Persistent atrial fibrillation ([place])   2. Shortness of breath   3. Nerve pain    PLAN:    In order of problems listed above:  persistent atrial fibrillation, heart rate 95.  CHA2DS2-VASc of 1 (age).  Echo with normal EF, 55 to 60%.  Normal LA size.  Continue Toprol-XL 25 mg daily, okay to start aspirin  81 mg.  Declined cardioversion, anticoagulation.  Refer to EP/A-fib clinic for any additional input. Dyspnea on exertion, echocardiogram with preserved EF.  Lexiscan Myoview with no significant ischemia , low risk study.  Shortness of breath likely from deconditioning.   Shooting pain down the left arm, consistent with neurologic etiology.  Follow-up with PCP regarding possible nerve impingement.  Follow-up in 6 months.   Medication Adjustments/Labs and Tests Ordered: Current medicines are reviewed at length with the patient today.  Concerns regarding medicines are outlined above.  Orders Placed This Encounter  Procedures   Ambulatory referral to Cardiac Electrophysiology   EKG 12-Lead     No orders of the defined types were placed in this encounter.     Patient Instructions  Medication Instructions:  - Your physician recommends that you continue on your current medications as directed. Please refer to the Current Medication list given to you today.  *If you need a refill on your cardiac medications before your next appointment, please call your pharmacy*   Lab Work: - none ordered  If you have labs (blood work) drawn today and your tests are completely normal, you will receive your results only by: [place] (if you have MyChart) OR A paper copy in the mail If you have any lab test that is abnormal or we need to change  your treatment, we will call you to review the results.   Testing/Procedures: - You have been referred to: Dr. Lars Mage (Electrophysiology) for further evaluation of your atrial fibrillation    Follow-Up: At [facility], you and your health needs are our priority.  As part of our continuing mission to provide you with exceptional heart care, we have created designated Provider Care Teams.  These Care Teams include your primary Cardiologist (physician) and Advanced Practice Providers (APPs -  Physician Assistants and Nurse Practitioners) who all work together to provide you with the care you need, when you need it.  We recommend signing up for the patient portal called "MyChart".  Sign up information is provided on this After Visit Summary.  MyChart is used to connect with patients for Virtual Visits (Telemedicine).  Patients are able to view lab/test results, encounter notes, upcoming appointments, etc.  Non-urgent messages can be sent to your provider as well.   To learn more about what you can do with MyChart, go to [email or web address].    Your next appointment:   6 month(s)  The format for your next appointment:   In Person  Provider:   Kate Sable, MD    Other Instructions N/a    Signed, Kate Sable, MD  07/31/2021 12:35 PM    [place]

## 2021-09-13 ENCOUNTER — Encounter: Payer: Self-pay | Admitting: Family Medicine

## 2021-09-13 ENCOUNTER — Ambulatory Visit (INDEPENDENT_AMBULATORY_CARE_PROVIDER_SITE_OTHER): Payer: Medicare Other | Admitting: Cardiology

## 2021-09-13 ENCOUNTER — Encounter: Payer: Self-pay | Admitting: Cardiology

## 2021-09-13 VITALS — BP 112/82 | HR 89 | Ht 73.0 in | Wt 281.0 lb

## 2021-09-13 DIAGNOSIS — I4821 Permanent atrial fibrillation: Secondary | ICD-10-CM | POA: Diagnosis not present

## 2021-09-13 NOTE — Progress Notes (Signed)
?Electrophysiology Office Note:   ? ?Date:  09/13/2021  ? ?ID:  Francis Jones, DOB 1950-11-12, MRN [id number] ? ?PCP:  Jerrol Banana., MD  ?[facility] Cardiologist:  Kate Sable, MD  ?[facility] Electrophysiologist:  Vickie Epley, MD  ? ?Referring MD: Kate Sable, MD  ? ?Chief Complaint: Atrial fibrillation ? ?History of Present Illness:   ? ?Francis Jones is a 71 y.o. male who presents for an evaluation of atrial fibrillation at the request of Dr. Garen Lah. Their medical history includes bladder cancer and persistent atrial fibrillation.  He is on aspirin 81 mg by mouth once daily.  He is not on any anticoagulant. ?He is very active.  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  No symptoms attributed to his atrial fibrillation.  Takes aspirin without any bleeding issues. ? ?  ?Past Medical History:  ?Diagnosis Date  ? Atrial fibrillation ([place]) 12/16/2020  ? Bladder cancer ([place])   ? Dyspnea   ? Medical history non-contributory   ? PONV (postoperative nausea and vomiting)   ? Urothelial carcinoma of bladder ([place])   ? ? ?Past Surgical History:  ?Procedure Laterality Date  ? APPENDECTOMY  1973  ? CYSTOSCOPY WITH BIOPSY N/A 12/27/2020  ? Procedure: CYSTOSCOPY WITH BLADDER BIOPSY;  Surgeon: Abbie Sons, MD;  Location: [facility];  Service: Urology;  Laterality: N/A;  ? CYSTOSCOPY WITH FULGERATION N/A 12/27/2020  ? Procedure: CYSTOSCOPY WITH FULGERATION;  Surgeon: Abbie Sons, MD;  Location: [facility];  Service: Urology;  Laterality: N/A;  ? CYSTOSCOPY WITH STENT PLACEMENT Right 02/04/2018  ? Procedure: CYSTOSCOPY;  Surgeon: Abbie Sons, MD;  Location: [facility];  Service: Urology;  Laterality: Right;  ? FINGER SURGERY Left 2001  ? titanium in left 5th finger  ? HERNIA REPAIR  2000  ? SHOULDER SURGERY Left 2009  ? TRANSURETHRAL RESECTION OF BLADDER TUMOR N/A 02/04/2018  ? Procedure: TRANSURETHRAL RESECTION OF BLADDER TUMOR (TURBT) with  gemcitabine;  Surgeon: Abbie Sons, MD;  Location: [facility];  Service: Urology;  Laterality: N/A;  ? TRANSURETHRAL RESECTION OF BLADDER TUMOR N/A 10/13/2019  ? Procedure: TRANSURETHRAL RESECTION OF BLADDER TUMOR (TURBT) with gemcitabine;  Surgeon: Abbie Sons, MD;  Location: [facility];  Service: Urology;  Laterality: N/A;  ? VASECTOMY  1987  ? ? ?Current Medications: ?Current Meds  ?Medication Sig  ? acetaminophen (TYLENOL) 650 MG CR tablet Take 650-1,300 mg by mouth every 8 (eight) hours as needed for pain.  ? aspirin EC 81 MG tablet Take 1 tablet (81 mg total) by mouth daily. Swallow whole.  ? Carboxymethylcellul-Glycerin (REFRESH OPTIVE OP) Place 1 drop into both eyes daily as needed (dryness).  ? cholecalciferol (VITAMIN D3) 25 MCG (1000 UNIT) tablet Take 1,000 Units by mouth daily.  ? ibuprofen (ADVIL,MOTRIN) 200 MG tablet Take 200-400 mg by mouth daily as needed for moderate pain.  ? metoprolol succinate (TOPROL XL) 25 MG 24 hr tablet Take 1 tablet (25 mg total) by mouth daily.  ? MULTIPLE VITAMIN PO Take 1 tablet by mouth daily.   ? Omega-3 Fatty Acids (FISH OIL) 1200 MG CAPS Take 2,400 mg by mouth daily.  ? SUPER B COMPLEX/C CAPS Take 1 capsule by mouth daily.  ?  ? ?Allergies:   Hydrocodone-acetaminophen and Oxycodone  ? ?Social History  ? ?Socioeconomic History  ? Marital status: Married  ?  Spouse name: Pricilla Holm  ? Number of children: Not on file  ?  Years of education: Not on file  ? Highest education level: Not on file  ?Occupational History  ? Occupation: retired Engineer, drilling  ?Tobacco Use  ? Smoking status: Former  ?  Types: Cigarettes  ?  Quit date: 1979  ?  Years since quitting: 44.2  ? Smokeless tobacco: Never  ?Vaping Use  ? Vaping Use: Never used  ?Substance and Sexual Activity  ? Alcohol use: Yes  ?  Alcohol/week: 2.0 standard drinks  ?  Types: 1 Glasses of wine, 1 Cans of beer per week  ?  Comment: occasional  ? Drug use: Not Currently  ?  Types: LSD, Opium  ?  Comment: as a  teenager  ? Sexual activity: Yes  ?  Birth control/protection: None  ?Other Topics Concern  ? Not on file  ?Social History Narrative  ? 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   ? ?Social Determinants of Health  ? ?Financial Resource Strain: Not on file  ?Food Insecurity: Not on file  ?Transportation Needs: Not on file  ?Physical Activity: Not on file  ?Stress: Not on file  ?Social Connections: Not on file  ?  ? ?Family History: ?The patient's family history includes Alzheimer's disease in his father. ? ?ROS:   ?Please see the history of present illness.    ?All other systems reviewed and are negative. ? ?EKGs/Labs/Other Studies Reviewed:   ? ?The following studies were reviewed today: ? ?August 2019 EKG shows sinus rhythm ?December 16, 2020 EKG shows atrial fibrillation ?July 31, 2021 EKG shows atrial fibrillation ? ?January 11, 2021 echo ?Normal left ventricular function, 55% ?Right ventricular function normal ?No significant valvular abnormalities ? ?EKG:  The ekg ordered today demonstrates atrial fibrillation with a ventricular rate of 89 bpm ? ? ?Recent Labs: ?02/06/2021: ALT 22; BUN 13; Creatinine, Ser 0.96; Hemoglobin 17.1; Platelets 230; Potassium 4.1; Sodium 140  ?Recent Lipid Panel ?No results found for: CHOL, TRIG, HDL, CHOLHDL, VLDL, LDLCALC, LDLDIRECT ? ?Physical Exam:   ? ?VS:  BP 112/82 (BP Location: Left Arm, Patient Position: Sitting, Cuff Size: Normal)   Pulse 89   Ht '6\' 1"'$  (1.854 m)   Wt 281 lb (127.5 kg)   SpO2 96%   BMI 37.07 kg/m?    ? ?Wt Readings from Last 3 Encounters:  ?09/13/21 281 lb (127.5 kg)  ?07/31/21 278 lb (126.1 kg)  ?01/30/21 272 lb 12.8 oz (123.7 kg)  ?  ? ?GEN:  Well nourished, well developed in no acute distress.  Obese ?HEENT: Normal ?NECK: No JVD; No carotid bruits ?LYMPHATICS: No lymphadenopathy ?CARDIAC: Irregularly irregular, no murmurs, rubs, gallops ?RESPIRATORY:  Clear to auscultation without rales, wheezing or rhonchi  ?ABDOMEN: Soft,  non-tender, non-distended ?MUSCULOSKELETAL:  No edema; No deformity  ?SKIN: Warm and dry ?NEUROLOGIC:  Alert and oriented x 3 ?PSYCHIATRIC:  Normal affect  ? ? ?  ? ?ASSESSMENT:   ? ?1. Permanent atrial fibrillation ([place])   ? ?PLAN:   ? ?In order of problems listed above: ? ?#Permanent atrial fibrillation ?Has been in atrial fibrillation for at least 1 to 2 years.  The patient thinks it is actually been longer than that.  Asymptomatic.  Rate controlled.  Normal LV function.  On aspirin for stroke prophylaxis given a low CHA2DS2-VASc of 1.  He will continue routine care with his primary care and primary cardiologist, Dr. Garen Lah.  EP can be available on an as-needed basis. ? ? ?Medication Adjustments/Labs and Tests Ordered: ?Current medicines are  reviewed at length with the patient today.  Concerns regarding medicines are outlined above.  ?Orders Placed This Encounter  ?Procedures  ? EKG 12-Lead  ? ?No orders of the defined types were placed in this encounter. ? ? ? ?Signed, ?Lysbeth Galas T. Quentin Ore, MD, [facility], [place] ?09/13/2021 10:52 AM    ?Electrophysiology ?[place] ?

## 2021-09-13 NOTE — Patient Instructions (Signed)
Medications: ?Your physician recommends that you continue on your current medications as directed. Please refer to the Current Medication list given to you today. ?*If you need a refill on your cardiac medications before your next appointment, please call your pharmacy* ? ?Lab Work: ?None. ?If you have labs (blood work) drawn today and your tests are completely normal, you will receive your results only by: ?MyChart Message (if you have MyChart) OR ?A paper copy in the mail ?If you have any lab test that is abnormal or we need to change your treatment, we will call you to review the results. ? ?Testing/Procedures: ?None. ? ?Follow-Up: ?At [facility], you and your health needs are our priority.  As part of our continuing mission to provide you with exceptional heart care, we have created designated Provider Care Teams.  These Care Teams include your primary Cardiologist (physician) and Advanced Practice Providers (APPs -  Physician Assistants and Nurse Practitioners) who all work together to provide you with the care you need, when you need it. ? ?Your physician wants you to follow-up in: As needed with Lars Mage  ? ?We recommend signing up for the patient portal called "MyChart".  Sign up information is provided on this After Visit Summary.  MyChart is used to connect with patients for Virtual Visits (Telemedicine).  Patients are able to view lab/test results, encounter notes, upcoming appointments, etc.  Non-urgent messages can be sent to your provider as well.   ?To learn more about what you can do with MyChart, go to [email or web address].   ? ?Any Other Special Instructions Will Be Listed Below (If Applicable). ? ?

## 2021-11-16 ENCOUNTER — Other Ambulatory Visit: Payer: Self-pay

## 2021-11-16 ENCOUNTER — Encounter: Payer: Self-pay | Admitting: Family Medicine

## 2021-11-16 DIAGNOSIS — I4891 Unspecified atrial fibrillation: Secondary | ICD-10-CM

## 2021-11-16 DIAGNOSIS — Z23 Encounter for immunization: Secondary | ICD-10-CM | POA: Diagnosis not present

## 2021-11-16 MED ORDER — METOPROLOL SUCCINATE ER 25 MG PO TB24: 25.0000 mg | ORAL_TABLET | Freq: Every day | ORAL | 2 refills | Status: DC

## 2021-11-21 DIAGNOSIS — H6983 Other specified disorders of Eustachian tube, bilateral: Secondary | ICD-10-CM | POA: Diagnosis not present

## 2021-11-21 DIAGNOSIS — H60593 Other noninfective acute otitis externa, bilateral: Secondary | ICD-10-CM | POA: Diagnosis not present

## 2021-12-11 ENCOUNTER — Other Ambulatory Visit: Payer: Self-pay

## 2021-12-11 DIAGNOSIS — I4891 Unspecified atrial fibrillation: Secondary | ICD-10-CM

## 2021-12-11 MED ORDER — METOPROLOL SUCCINATE ER 25 MG PO TB24: 25.0000 mg | ORAL_TABLET | Freq: Every day | ORAL | 1 refills | Status: DC

## 2022-01-01 ENCOUNTER — Other Ambulatory Visit: Payer: Self-pay

## 2022-01-09 ENCOUNTER — Other Ambulatory Visit: Payer: Self-pay

## 2022-01-24 DIAGNOSIS — R208 Other disturbances of skin sensation: Secondary | ICD-10-CM | POA: Diagnosis not present

## 2022-01-24 DIAGNOSIS — D485 Neoplasm of uncertain behavior of skin: Secondary | ICD-10-CM | POA: Diagnosis not present

## 2022-01-24 DIAGNOSIS — L858 Other specified epidermal thickening: Secondary | ICD-10-CM | POA: Diagnosis not present

## 2022-01-26 ENCOUNTER — Ambulatory Visit (INDEPENDENT_AMBULATORY_CARE_PROVIDER_SITE_OTHER): Payer: Medicare Other | Admitting: Cardiology

## 2022-01-26 ENCOUNTER — Encounter: Payer: Self-pay | Admitting: Cardiology

## 2022-01-26 VITALS — BP 110/80 | HR 95 | Ht 72.0 in | Wt 286.0 lb

## 2022-01-26 DIAGNOSIS — I4821 Permanent atrial fibrillation: Secondary | ICD-10-CM

## 2022-01-26 DIAGNOSIS — Z6838 Body mass index (BMI) 38.0-38.9, adult: Secondary | ICD-10-CM | POA: Diagnosis not present

## 2022-01-26 NOTE — Progress Notes (Signed)
Cardiology Office Note:    Date:  01/26/2022   ID:  Francis Jones, DOB May 01, 1951, MRN [id number]  PCP:  Jerrol Banana., MD   [facility] Providers Cardiologist:  Kate Sable, MD Electrophysiologist:  Vickie Epley, MD     Referring MD: Jerrol Banana.,*   Chief Complaint  Patient presents with   Other    6 month f/u no complaints today. Meds reviewed verbally with pt.    History of Present Illness:    Francis Jones is a 71 y.o. male with a hx of persistent atrial fibrillation, bladder cancer who presents for follow-up.    Being seen for persistent atrial fibrillation.  Denies dizziness, palpitations.  Has occasional shortness of breath with exertion.  No new concerns, feeling okay.  Anticoagulation, cardioversion previously discussed, patient declined.  Evaluated by EP, no changes to medications.  Prior notes Echo 01/2021 EF 55 to 60% Lexiscan Myoview 03/2021, low risk study, no ischemia.   Past Medical History:  Diagnosis Date   Atrial fibrillation ([place]) 12/16/2020   Bladder cancer ([place])    Dyspnea    Medical history non-contributory    PONV (postoperative nausea and vomiting)    Urothelial carcinoma of bladder ([place])     Past Surgical History:  Procedure Laterality Date   APPENDECTOMY  1973   CYSTOSCOPY WITH BIOPSY N/A 12/27/2020   Procedure: CYSTOSCOPY WITH BLADDER BIOPSY;  Surgeon: Abbie Sons, MD;  Location: [facility] ORS;  Service: Urology;  Laterality: N/A;   CYSTOSCOPY WITH FULGERATION N/A 12/27/2020   Procedure: CYSTOSCOPY WITH FULGERATION;  Surgeon: Abbie Sons, MD;  Location: [facility] ORS;  Service: Urology;  Laterality: N/A;   CYSTOSCOPY WITH STENT PLACEMENT Right 02/04/2018   Procedure: CYSTOSCOPY;  Surgeon: Abbie Sons, MD;  Location: [facility] ORS;  Service: Urology;  Laterality: Right;   FINGER SURGERY Left 2001   titanium in left 5th finger   HERNIA REPAIR  2000   SHOULDER SURGERY Left 2009   TRANSURETHRAL RESECTION OF  BLADDER TUMOR N/A 02/04/2018   Procedure: TRANSURETHRAL RESECTION OF BLADDER TUMOR (TURBT) with gemcitabine;  Surgeon: Abbie Sons, MD;  Location: [facility] OR[facility];  Service: Urology;  Laterality: N/A;   TRANSURETHRAL RESECTION OF BLADDER TUMOR N/A 10/13/2019   Procedure: TRANSURETHRAL RESECTION OF BLADDER TUMOR (TURBT) with gemcitabine;  Surgeon: Abbie Sons, MD;  Location: [facility] OR[facility];  Service: Urology;  Laterality: N/A;   VASECTOMY  1987    Current Medications: Current Meds  Medication Sig   acetaminophen (TYLENOL) 650 MG CR tablet Take 650-1,300 mg by mouth every 8 (eight) hours as needed for pain.   aspirin EC 81 MG tablet Take 1 tablet (81 mg total) by mouth daily. Swallow whole.   Carboxymethylcellul-Glycerin (REFRESH OPTIVE OP) Place 1 drop into both eyes daily as needed (dryness).   cetirizine (ZYRTEC) 10 MG tablet Take 10 mg by mouth daily.   cholecalciferol (VITAMIN D3) 25 MCG (1000 UNIT) tablet Take 1,000 Units by mouth daily.   ibuprofen (ADVIL,MOTRIN) 200 MG tablet Take 200-400 mg by mouth daily as needed for moderate pain.   metoprolol succinate (TOPROL XL) 25 MG 24 hr tablet Take 1 tablet (25 mg total) by mouth daily.   MULTIPLE VITAMIN PO Take 1 tablet by mouth daily.    Omega-3 Fatty Acids (FISH OIL) 1200 MG CAPS Take 2,400 mg by mouth daily.   SUPER B COMPLEX/C CAPS Take 1 capsule by mouth daily.     Allergies:   Hydrocodone-acetaminophen  and Oxycodone   Social History   Socioeconomic History   Marital status: Married    Spouse name: Pricilla Holm   Number of children: Not on file   Years of education: Not on file   Highest education level: Not on file  Occupational History   Occupation: retired Engineer, drilling  Tobacco Use   Smoking status: Former    Types: Cigarettes    Quit date: 1979    Years since quitting: 44.6   Smokeless tobacco: Never  Vaping Use   Vaping Use: Never used  Substance and Sexual Activity   Alcohol use: Yes    Alcohol/week: 2.0  standard drinks of alcohol    Types: 1 Glasses of wine, 1 Cans of beer per week    Comment: occasional   Drug use: Not Currently    Types: LSD, Opium    Comment: as a teenager   Sexual activity: Yes    Birth control/protection: None  Other Topics Concern   Not on file  Social History Narrative   Pt lives with wife; retd- Sports administrator- Conservation officer, [employer]. 5 years smoker- remote.    Social Determinants of Health   Financial Resource Strain: Not on file  Food Insecurity: Not on file  Transportation Needs: Not on file  Physical Activity: Not on file  Stress: Not on file  Social Connections: Not on file     Family History: The patient's family history includes Alzheimer's disease in his father.  ROS:   Please see the history of present illness.     All other systems reviewed and are negative.  EKGs/Labs/Other Studies Reviewed:    The following studies were reviewed today:   EKG:  EKG is ordered today.  EKG shows atrial fibrillation, heart rate 95.  Recent Labs: 02/06/2021: ALT 22; BUN 13; Creatinine, Ser 0.96; Hemoglobin 17.1; Platelets 230; Potassium 4.1; Sodium 140  Recent Lipid Panel No results found for: "CHOL", "TRIG", "HDL", "CHOLHDL", "VLDL", "LDLCALC", "LDLDIRECT"   Risk Assessment/Calculations:          Physical Exam:    VS:  BP 110/80 (BP Location: Left Arm, Patient Position: Sitting, Cuff Size: Large)   Pulse 95   Ht 6' (1.829 m)   Wt 286 lb (129.7 kg)   SpO2 97%   BMI 38.79 kg/m     Wt Readings from Last 3 Encounters:  01/26/22 286 lb (129.7 kg)  09/13/21 281 lb (127.5 kg)  07/31/21 278 lb (126.1 kg)     GEN:  Well nourished, well developed in no acute distress HEENT: Normal NECK: No JVD; No carotid bruits CARDIAC: Irregular irregular, no murmurs, rubs, gallops RESPIRATORY:  Clear to auscultation without rales, wheezing or rhonchi  ABDOMEN: Soft, non-tender, non-distended MUSCULOSKELETAL:  No edema; No deformity  SKIN: Warm and dry NEUROLOGIC:  Alert and  oriented x 3 PSYCHIATRIC:  Normal affect   ASSESSMENT:    1. Permanent atrial fibrillation ([place])   2. BMI 38.0-38.9,adult     PLAN:    In order of problems listed above:  Permanent atrial fibrillation, heart rate 95.  Denies palpitations.  CHA2DS2-VASc of 1 (age).  EF 55 to 60%.  Normal LA size.  Continue Toprol-XL 25 mg daily, aspirin 81 mg.  Previously declined declined cardioversion, anticoagulation.   Obesity, low-calorie diet, exercise advised.  Follow-up in 12 months.   Medication Adjustments/Labs and Tests Ordered: Current medicines are reviewed at length with the patient today.  Concerns regarding medicines are outlined above.  Orders Placed This Encounter  Procedures  EKG 12-Lead     No orders of the defined types were placed in this encounter.     Patient Instructions  Medication Instructions: Your physician recommends that you continue on your current medications as directed. Please refer to the Current Medication list given to you today.  *If you need a refill on your cardiac medications before your next appointment, please call your pharmacy*    Follow-Up: At [facility], you and your health needs are our priority.  As part of our continuing mission to provide you with exceptional heart care, we have created designated Provider Care Teams.  These Care Teams include your primary Cardiologist (physician) and Advanced Practice Providers (APPs -  Physician Assistants and Nurse Practitioners) who all work together to provide you with the care you need, when you need it.  We recommend signing up for the patient portal called "MyChart".  Sign up information is provided on this After Visit Summary.  MyChart is used to connect with patients for Virtual Visits (Telemedicine).  Patients are able to view lab/test results, encounter notes, upcoming appointments, etc.  Non-urgent messages can be sent to your provider as well.   To learn more about what you can do with  MyChart, go to [email or web address].    Your next appointment:   1 year(s)  The format for your next appointment:   In Person  Provider:   Kate Sable, MD    Other Instructions   Important Information About Sugar         Signed, Kate Sable, MD  01/26/2022 12:18 PM    [place]

## 2022-01-26 NOTE — Patient Instructions (Signed)
Medication Instructions:   Your physician recommends that you continue on your current medications as directed. Please refer to the Current Medication list given to you today.   *If you need a refill on your cardiac medications before your next appointment, please call your pharmacy*  Follow-Up: At [facility], you and your health needs are our priority.  As part of our continuing mission to provide you with exceptional heart care, we have created designated Provider Care Teams.  These Care Teams include your primary Cardiologist (physician) and Advanced Practice Providers (APPs -  Physician Assistants and Nurse Practitioners) who all work together to provide you with the care you need, when you need it.  We recommend signing up for the patient portal called "MyChart".  Sign up information is provided on this After Visit Summary.  MyChart is used to connect with patients for Virtual Visits (Telemedicine).  Patients are able to view lab/test results, encounter notes, upcoming appointments, etc.  Non-urgent messages can be sent to your provider as well.   To learn more about what you can do with MyChart, go to [email or web address].    Your next appointment:   1 year(s)  The format for your next appointment:   In Person  Provider:   Brian Agbor-Etang, MD    Other Instructions   Important Information About Sugar       

## 2022-01-29 ENCOUNTER — Ambulatory Visit: Payer: Medicare Other | Admitting: Cardiology

## 2022-03-06 ENCOUNTER — Other Ambulatory Visit: Payer: Self-pay

## 2022-03-06 DIAGNOSIS — I4891 Unspecified atrial fibrillation: Secondary | ICD-10-CM

## 2022-03-06 MED ORDER — METOPROLOL SUCCINATE ER 25 MG PO TB24: 25.0000 mg | ORAL_TABLET | Freq: Every day | ORAL | 8 refills | Status: DC

## 2022-03-19 DIAGNOSIS — Z23 Encounter for immunization: Secondary | ICD-10-CM | POA: Diagnosis not present

## 2022-05-16 DIAGNOSIS — D2262 Melanocytic nevi of left upper limb, including shoulder: Secondary | ICD-10-CM | POA: Diagnosis not present

## 2022-05-16 DIAGNOSIS — D225 Melanocytic nevi of trunk: Secondary | ICD-10-CM | POA: Diagnosis not present

## 2022-05-16 DIAGNOSIS — D2272 Melanocytic nevi of left lower limb, including hip: Secondary | ICD-10-CM | POA: Diagnosis not present

## 2022-05-16 DIAGNOSIS — X32XXXA Exposure to sunlight, initial encounter: Secondary | ICD-10-CM | POA: Diagnosis not present

## 2022-05-16 DIAGNOSIS — D485 Neoplasm of uncertain behavior of skin: Secondary | ICD-10-CM | POA: Diagnosis not present

## 2022-05-16 DIAGNOSIS — L570 Actinic keratosis: Secondary | ICD-10-CM | POA: Diagnosis not present

## 2022-05-16 DIAGNOSIS — D2261 Melanocytic nevi of right upper limb, including shoulder: Secondary | ICD-10-CM | POA: Diagnosis not present

## 2022-05-16 DIAGNOSIS — L281 Prurigo nodularis: Secondary | ICD-10-CM | POA: Diagnosis not present

## 2022-05-16 DIAGNOSIS — L821 Other seborrheic keratosis: Secondary | ICD-10-CM | POA: Diagnosis not present

## 2022-05-16 DIAGNOSIS — D2271 Melanocytic nevi of right lower limb, including hip: Secondary | ICD-10-CM | POA: Diagnosis not present

## 2022-05-16 DIAGNOSIS — C4442 Squamous cell carcinoma of skin of scalp and neck: Secondary | ICD-10-CM | POA: Diagnosis not present

## 2022-05-17 DIAGNOSIS — H04123 Dry eye syndrome of bilateral lacrimal glands: Secondary | ICD-10-CM | POA: Diagnosis not present

## 2022-05-21 DIAGNOSIS — C4442 Squamous cell carcinoma of skin of scalp and neck: Secondary | ICD-10-CM | POA: Diagnosis not present

## 2022-05-21 DIAGNOSIS — C44329 Squamous cell carcinoma of skin of other parts of face: Secondary | ICD-10-CM | POA: Diagnosis not present

## 2022-06-24 ENCOUNTER — Telehealth: Payer: Medicare Other | Admitting: Physician Assistant

## 2022-06-24 DIAGNOSIS — U071 COVID-19: Secondary | ICD-10-CM | POA: Diagnosis not present

## 2022-06-24 MED ORDER — NIRMATRELVIR/RITONAVIR (PAXLOVID)TABLET
3.0000 | ORAL_TABLET | Freq: Two times a day (BID) | ORAL | 0 refills | Status: AC
Start: 2022-06-24 — End: 2022-06-29

## 2022-06-24 NOTE — Patient Instructions (Signed)
Francis Jones, thank you for joining Mar Daring, PA-C for today's virtual visit.  While this provider is not your primary care provider (PCP), if your PCP is located in our provider database this encounter information will be shared with them immediately following your visit.   [facility] account gives you access to today's visit and all your visits, tests, and labs performed at [facility] " click here if you don't have a [facility] account or go to [email or web address]  Consent: (Patient) Francis Jones provided verbal consent for this virtual visit at the beginning of the encounter.  Current Medications:  Current Outpatient Medications:    loratadine (CLARITIN) 10 MG tablet, Take 10 mg by mouth daily as needed for allergies., Disp: , Rfl:    nirmatrelvir/ritonavir (PAXLOVID) 20 x 150 MG & 10 x '100MG'$  TABS, Take 3 tablets by mouth 2 (two) times daily for 5 days. (Take nirmatrelvir 150 mg two tablets twice daily for 5 days and ritonavir 100 mg one tablet twice daily for 5 days) Patient GFR is greater than 60, Disp: 30 tablet, Rfl: 0   acetaminophen (TYLENOL) 650 MG CR tablet, Take 650-1,300 mg by mouth every 8 (eight) hours as needed for pain., Disp: , Rfl:    aspirin EC 81 MG tablet, Take 1 tablet (81 mg total) by mouth daily. Swallow whole., Disp: 90 tablet, Rfl: 3   Carboxymethylcellul-Glycerin (REFRESH OPTIVE OP), Place 1 drop into both eyes daily as needed (dryness)., Disp: , Rfl:    cholecalciferol (VITAMIN D3) 25 MCG (1000 UNIT) tablet, Take 1,000 Units by mouth daily., Disp: , Rfl:    ibuprofen (ADVIL,MOTRIN) 200 MG tablet, Take 200-400 mg by mouth daily as needed for moderate pain., Disp: , Rfl:    metoprolol succinate (TOPROL XL) 25 MG 24 hr tablet, Take 1 tablet (25 mg total) by mouth daily., Disp: 30 tablet, Rfl: 8   MULTIPLE VITAMIN PO, Take 1 tablet by mouth daily. , Disp: , Rfl:    Omega-3 Fatty Acids (FISH OIL) 1200 MG CAPS, Take  2,400 mg by mouth daily., Disp: , Rfl:    SUPER B COMPLEX/C CAPS, Take 1 capsule by mouth daily., Disp: , Rfl:    Medications ordered in this encounter:  Meds ordered this encounter  Medications   nirmatrelvir/ritonavir (PAXLOVID) 20 x 150 MG & 10 x '100MG'$  TABS    Sig: Take 3 tablets by mouth 2 (two) times daily for 5 days. (Take nirmatrelvir 150 mg two tablets twice daily for 5 days and ritonavir 100 mg one tablet twice daily for 5 days) Patient GFR is greater than 60    Dispense:  30 tablet    Refill:  0    Order Specific Question:   Supervising Provider    Answer:   Chase Picket [id number]     *If you need refills on other medications prior to your next appointment, please contact your pharmacy*  Follow-Up: Call back or seek an in-person evaluation if the symptoms worsen or if the condition fails to improve as anticipated.  [place] [phone]  Other Instructions  Can take to lessen severity: Vit C '500mg'$  twice daily Quercertin 250-'500mg'$  twice daily Zinc 75-'100mg'$  daily Melatonin 3-6 mg at bedtime Vit D3 1000-2000 IU daily Aspirin 81 mg daily with food Optional: Famotidine '20mg'$  daily Also can add tylenol/ibuprofen as needed for fevers and body aches May add Mucinex or Mucinex DM as needed for cough/congestion   COVID-19 COVID-19,  or coronavirus disease 2019, is an infection that is caused by a new (novel) coronavirus called SARS-CoV-2. COVID-19 can cause many symptoms. In some people, the virus may not cause any symptoms. In others, it may cause mild or severe symptoms. Some people with severe infection develop severe disease. What are the causes? This illness is caused by a virus. The virus may be in the air as tiny specks of fluid (aerosols) or droplets, or it may be on surfaces. You may catch the virus by: Breathing in droplets from an infected person. Droplets can be spread by a person breathing, speaking, singing, coughing, or sneezing. Touching  something, like a table or a doorknob, that has virus on it (is contaminated) and then touching your mouth, nose, or eyes. What increases the risk? Risk for infection: You are more likely to get infected with the COVID-19 virus if: You are within 6 ft (1.8 m) of a person with COVID-19 for 15 minutes or longer. You are providing care for a person who is infected with COVID-19. You are in close personal contact with other people. Close personal contact includes hugging, kissing, or sharing eating or drinking utensils. Risk for serious illness caused by COVID-19: You are more likely to get seriously ill from the COVID-19 virus if: You have cancer. You have a long-term (chronic) disease, such as: Chronic lung disease. This includes pulmonary embolism, chronic obstructive pulmonary disease, and cystic fibrosis. Long-term disease that lowers your body's ability to fight infection (immunocompromise). Serious cardiac conditions, such as heart failure, coronary artery disease, or cardiomyopathy. Diabetes. Chronic kidney disease. Liver diseases. These include cirrhosis, nonalcoholic fatty liver disease, alcoholic liver disease, or autoimmune hepatitis. You have obesity. You are pregnant or were recently pregnant. You have sickle cell disease. What are the signs or symptoms? Symptoms of this condition can range from mild to severe. Symptoms may appear any time from 2 to 14 days after being exposed to the virus. They include: Fever or chills. Shortness of breath or trouble breathing. Feeling tired or very tired. Headaches, body aches, or muscle aches. Runny or stuffy nose, sneezing, coughing, or sore throat. New loss of taste or smell. This is rare. Some people may also have stomach problems, such as nausea, vomiting, or diarrhea. Other people may not have any symptoms of COVID-19. How is this diagnosed? This condition may be diagnosed by testing samples to check for the COVID-19 virus. The most  common tests are the PCR test and the antigen test. Tests may be done in the lab or at home. They include: Using a swab to take a sample of fluid from the back of your nose and throat (nasopharyngeal fluid), from your nose, or from your throat. Testing a sample of saliva from your mouth. Testing a sample of coughed-up mucus from your lungs (sputum). How is this treated? Treatment for COVID-19 infection depends on the severity of the condition. Mild symptoms can be managed at home with rest, fluids, and over-the-counter medicines. Serious symptoms may be treated in a hospital intensive care unit (ICU). Treatment in the ICU may include: Supplemental oxygen. Extra oxygen is given through a tube in the nose, a face mask, or a hood. Medicines. These may include: Antivirals, such as monoclonal antibodies. These help your body fight off certain viruses that can cause disease. Anti-inflammatories, such as corticosteroids. These reduce inflammation and suppress the immune system. Antithrombotics. These prevent or treat blood clots, if they develop. Convalescent plasma. This helps boost your immune system, if you  have an underlying immunosuppressive condition or are getting immunosuppressive treatments. Prone positioning. This means you will lie on your stomach. This helps oxygen to get into your lungs. Infection control measures. If you are at risk for more serious illness caused by COVID-19, your health care provider may prescribe two long-acting monoclonal antibodies, given together every 6 months. How is this prevented? To protect yourself: Use preventive medicine (pre-exposure prophylaxis). You may get pre-exposure prophylaxis if you have moderate or severe immunocompromise. Get vaccinated. Anyone 12 months old or older who meets guidelines can get a COVID-19 vaccine or vaccine series. This includes people who are pregnant or making breast milk (lactating). Get an added dose of COVID-19 vaccine after  your first vaccine or vaccine series if you have moderate to severe immunocompromise. This applies if you have had a solid organ transplant or have been diagnosed with an immunocompromising condition. You should get the added dose 4 weeks after you got the first COVID-19 vaccine or vaccine series. If you get an mRNA vaccine, you will need a 3-dose primary series. If you get the J&J/Janssen vaccine, you will need a 2-dose primary series, with the second dose being an mRNA vaccine. Talk to your health care provider about getting experimental monoclonal antibodies. This treatment is approved under emergency use authorization to prevent severe illness before or after being exposed to the COVID-19 virus. You may be given monoclonal antibodies if: You have moderate or severe immunocompromise. This includes treatments that lower your immune response. People with immunocompromise may not develop protection against COVID-19 when they are vaccinated. You cannot be vaccinated. You may not get a vaccine if you have a severe allergic reaction to the vaccine or its components. You are not fully vaccinated. You are in a facility where COVID-19 is present and: Are in close contact with a person who is infected with the COVID-19 virus. Are at high risk of being exposed to the COVID-19 virus. You are at risk of illness from new variants of the COVID-19 virus. To protect others: If you have symptoms of COVID-19, take steps to prevent the virus from spreading to others. Stay home. Leave your house only to get medical care. Do not use public transit, if possible. Do not travel while you are sick. Wash your hands often with soap and water for at least 20 seconds. If soap and water are not available, use alcohol-based hand sanitizer. Make sure that all people in your household wash their hands well and often. Cough or sneeze into a tissue or your sleeve or elbow. Do not cough or sneeze into your hand or into the  air. Where to find more information Centers for Disease Control and Prevention: CharmCourses.be World Health Organization: https://www.castaneda.info/ Get help right away if: You have trouble breathing. You have pain or pressure in your chest. You are confused. You have bluish lips and fingernails. You have trouble waking from sleep. You have symptoms that get worse. These symptoms may be an emergency. Get help right away. Call 911. Do not wait to see if the symptoms will go away. Do not drive yourself to the hospital. Summary COVID-19 is an infection that is caused by a new coronavirus. Sometimes, there are no symptoms. Other times, symptoms range from mild to severe. Some people with a severe COVID-19 infection develop severe disease. The virus that causes COVID-19 can spread from person to person through droplets or aerosols from breathing, speaking, singing, coughing, or sneezing. Mild symptoms of COVID-19 can be managed at  home with rest, fluids, and over-the-counter medicines. This information is not intended to replace advice given to you by your health care provider. Make sure you discuss any questions you have with your health care provider. Document Revised: 05/16/2021 Document Reviewed: 05/18/2021 Elsevier Patient Education  [place].    If you have been instructed to have an in-person evaluation today at a local Urgent Care facility, please use the link below. It will take you to a list of all of our available [facility], including address, phone number and hours of operation. Please do not delay care.  [facility]  If you or a family member do not have a primary care provider, use the link below to schedule a visit and establish care. When you choose a [facility] primary care physician or advanced practice provider, you gain a long-term partner in health. Find a Primary Care Provider  Learn more about [facility]'s  in-office and virtual care options: [facility]

## 2022-06-24 NOTE — Progress Notes (Signed)
Virtual Visit Consent   Francis Jones, you are scheduled for a virtual visit with a [facility] provider today. Just as with appointments in the office, your consent must be obtained to participate. Your consent will be active for this visit and any virtual visit you may have with one of our providers in the next 365 days. If you have a MyChart account, a copy of this consent can be sent to you electronically.  As this is a virtual visit, video technology does not allow for your provider to perform a traditional examination. This may limit your provider's ability to fully assess your condition. If your provider identifies any concerns that need to be evaluated in person or the need to arrange testing (such as labs, EKG, etc.), we will make arrangements to do so. Although advances in technology are sophisticated, we cannot ensure that it will always work on either your end or our end. If the connection with a video visit is poor, the visit may have to be switched to a telephone visit. With either a video or telephone visit, we are not always able to ensure that we have a secure connection.  By engaging in this virtual visit, you consent to the provision of healthcare and authorize for your insurance to be billed (if applicable) for the services provided during this visit. Depending on your insurance coverage, you may receive a charge related to this service.  I need to obtain your verbal consent now. Are you willing to proceed with your visit today? Francis Jones has provided verbal consent on 06/24/2022 for a virtual visit (video or telephone). Mar Daring, PA-C  Date: 06/24/2022 10:39 AM  Virtual Visit via Video Note   I, Mar Daring, connected with  Francis Jones  ([id number], 09-06-1950) on 06/24/22 at 10:30 AM EST by a video-enabled telemedicine application and verified that I am speaking with the correct person using two identifiers.  Location: Patient: Virtual Visit Location  Patient: Home Provider: Virtual Visit Location Provider: Home Office   I discussed the limitations of evaluation and management by telemedicine and the availability of in person appointments. The patient expressed understanding and agreed to proceed.    History of Present Illness: Francis Jones is a 72 y.o. who identifies as a male who was assigned male at birth, and is being seen today for Covid 22.  HPI: URI  This is a new problem. The current episode started yesterday (Symptoms started yesterday, 06/23/22; Tested positive this morning on at home test for Covid 19). The problem has been gradually worsening. There has been no fever. Associated symptoms include congestion, coughing (mild), headaches, rhinorrhea, sinus pain, sneezing and a sore throat (mild). Pertinent negatives include no diarrhea, ear pain, nausea, plugged ear sensation, swollen glands or vomiting. Associated symptoms comments: Body aches, dysgeusia. He has tried acetaminophen for the symptoms. The treatment provided no relief.   Had flown back from [place].  Fully vaccinated   Problems:  Patient Active Problem List   Diagnosis Date Noted   Cancer of overlapping sites of bladder ([place]) 01/11/2021   Allergic rhinitis 12/30/2015   HLD (hyperlipidemia) 12/30/2015   Obstructive apnea 12/30/2015    Allergies:  Allergies  Allergen Reactions   Hydrocodone-Acetaminophen Nausea And Vomiting   Oxycodone Nausea And Vomiting   Medications:  Current Outpatient Medications:    loratadine (CLARITIN) 10 MG tablet, Take 10 mg by mouth daily as needed for allergies., Disp: , Rfl:    nirmatrelvir/ritonavir (PAXLOVID)  20 x 150 MG & 10 x '100MG'$  TABS, Take 3 tablets by mouth 2 (two) times daily for 5 days. (Take nirmatrelvir 150 mg two tablets twice daily for 5 days and ritonavir 100 mg one tablet twice daily for 5 days) Patient GFR is greater than 60, Disp: 30 tablet, Rfl: 0   acetaminophen (TYLENOL) 650 MG CR tablet, Take 650-1,300 mg by  mouth every 8 (eight) hours as needed for pain., Disp: , Rfl:    aspirin EC 81 MG tablet, Take 1 tablet (81 mg total) by mouth daily. Swallow whole., Disp: 90 tablet, Rfl: 3   Carboxymethylcellul-Glycerin (REFRESH OPTIVE OP), Place 1 drop into both eyes daily as needed (dryness)., Disp: , Rfl:    cholecalciferol (VITAMIN D3) 25 MCG (1000 UNIT) tablet, Take 1,000 Units by mouth daily., Disp: , Rfl:    ibuprofen (ADVIL,MOTRIN) 200 MG tablet, Take 200-400 mg by mouth daily as needed for moderate pain., Disp: , Rfl:    metoprolol succinate (TOPROL XL) 25 MG 24 hr tablet, Take 1 tablet (25 mg total) by mouth daily., Disp: 30 tablet, Rfl: 8   MULTIPLE VITAMIN PO, Take 1 tablet by mouth daily. , Disp: , Rfl:    Omega-3 Fatty Acids (FISH OIL) 1200 MG CAPS, Take 2,400 mg by mouth daily., Disp: , Rfl:    SUPER B COMPLEX/C CAPS, Take 1 capsule by mouth daily., Disp: , Rfl:   Observations/Objective: Patient is well-developed, well-nourished in no acute distress.  Resting comfortably at home.  Head is normocephalic, atraumatic.  No labored breathing.  Speech is clear and coherent with logical content.  Patient is alert and oriented at baseline.    Assessment and Plan: 1. COVID-19 - nirmatrelvir/ritonavir (PAXLOVID) 20 x 150 MG & 10 x '100MG'$  TABS; Take 3 tablets by mouth 2 (two) times daily for 5 days. (Take nirmatrelvir 150 mg two tablets twice daily for 5 days and ritonavir 100 mg one tablet twice daily for 5 days) Patient GFR is greater than 60  Dispense: 30 tablet; Refill: 0 - MyChart COVID-19 home monitoring program; Future  - Continue OTC symptomatic management of choice - Will send OTC vitamins and supplement information through AVS - Paxlovid prescribed - Patient enrolled in MyChart symptom monitoring - Push fluids - Rest as needed - Discussed return precautions and when to seek in-person evaluation, sent via AVS as well   Follow Up Instructions: I discussed the assessment and treatment  plan with the patient. The patient was provided an opportunity to ask questions and all were answered. The patient agreed with the plan and demonstrated an understanding of the instructions.  A copy of instructions were sent to the patient via MyChart unless otherwise noted below.    The patient was advised to call back or seek an in-person evaluation if the symptoms worsen or if the condition fails to improve as anticipated.  Time:  I spent 12 minutes with the patient via telehealth technology discussing the above problems/concerns.    Mar Daring, PA-C

## 2022-08-27 ENCOUNTER — Encounter: Payer: Self-pay | Admitting: Cardiology

## 2022-11-23 ENCOUNTER — Encounter: Payer: Self-pay | Admitting: Urology

## 2022-11-23 ENCOUNTER — Ambulatory Visit (INDEPENDENT_AMBULATORY_CARE_PROVIDER_SITE_OTHER): Payer: Medicare Other | Admitting: Urology

## 2022-11-23 VITALS — BP 120/82 | HR 92 | Ht 72.0 in | Wt 279.0 lb

## 2022-11-23 DIAGNOSIS — D494 Neoplasm of unspecified behavior of bladder: Secondary | ICD-10-CM

## 2022-11-23 DIAGNOSIS — C679 Malignant neoplasm of bladder, unspecified: Secondary | ICD-10-CM

## 2022-11-23 DIAGNOSIS — Z8551 Personal history of malignant neoplasm of bladder: Secondary | ICD-10-CM

## 2022-11-23 LAB — URINALYSIS, COMPLETE
Bilirubin, UA: NEGATIVE
Glucose, UA: NEGATIVE
Ketones, UA: NEGATIVE
Leukocytes,UA: NEGATIVE
Nitrite, UA: NEGATIVE
Protein,UA: NEGATIVE
RBC, UA: NEGATIVE
Specific Gravity, UA: 1.02 (ref 1.005–1.030)
Urobilinogen, Ur: 2 mg/dL — ABNORMAL HIGH (ref 0.2–1.0)
pH, UA: 6 (ref 5.0–7.5)

## 2022-11-23 LAB — MICROSCOPIC EXAMINATION

## 2022-11-25 ENCOUNTER — Other Ambulatory Visit: Payer: Self-pay | Admitting: Urology

## 2022-11-25 DIAGNOSIS — C679 Malignant neoplasm of bladder, unspecified: Secondary | ICD-10-CM

## 2022-11-25 NOTE — Progress Notes (Unsigned)
Surgical Physician Order Form [facility] [place]  * Scheduling expectation :  Pt pref  *Length of Case: 30 min  *Clearance needed: no  *Anticoagulation Instructions: N/A  *Aspirin Instructions: Hold Aspirin  *Post-op visit Date/Instructions:  1-2 week with pathology review  *Diagnosis: Bladder Tumor  *Procedure:  TURBT <2cm (16109)   Additional orders: Gemcitabine 2000mg  bladder instillation  -Admit type: OUTpatient  -Anesthesia: Choice  -VTE Prophylaxis Standing Order SCD's       Other:   -Standing Lab Orders Per Anesthesia    Lab other: UA&Urine Culture  -Standing Test orders EKG/Chest x-ray per Anesthesia       Test other:   - Medications:  Ancef 2gm IV  -Other orders:  N/A

## 2022-11-25 NOTE — H&P (View-Only) (Signed)
   11/25/22  CC:  Chief Complaint  Patient presents with   Cysto   Urologic history -TURBT August 2019 papillary bladder tumor - High-grade urothelial carcinoma Ta - Induction BCG completed November 2019 -TURBT 10/13/2019 10 mm papillary lesion right bladder neck, received post resection gemcitabine -Pathology Ta urothelial carcinoma (low-grade) -TURBT 12/2020 for 5 mm recurrent tumor posterior wall -Pathology Ta low-grade urothelial carcinoma -Underwent 6-week course of intravesical gemcitabine and oncology   HPI: 72 y.o. male who completed his 6-week course of intravesical gemcitabine however did not follow-up with urology and has not been seen since his 2022.  He was contacted for follow-up cystoscopy.  He has no complaints  Blood pressure 120/82, pulse 92, height 6' (1.829 m), weight 279 lb (126.6 kg). NED. A&Ox3.   No respiratory distress   Abd soft, NT, ND Normal phallus with bilateral descended testicles  Cystoscopy Procedure Note  Patient identification was confirmed, informed consent was obtained, and patient was prepped using Betadine solution.  Lidocaine jelly was administered per urethral meatus.     Pre-Procedure: - Inspection reveals a normal caliber urethral meatus.  Procedure: The flexible cystoscope was introduced without difficulty - No urethral strictures/lesions are present. -  Mild lateral lobe enlargement  prostate  - Normal bladder neck - Bilateral ureteral orifices identified - Bladder mucosa  reveals ~ 1 cm papillary tumor bladder neck at 6:00 - No bladder stones -Moderate trabeculation  Retroflexion shows bladder neck tumor as above   Post-Procedure: - Patient tolerated the procedure well  Assessment/ Plan: Recurrent papillary bladder tumor Schedule TURBT.  The procedure was discussed in detail including potential risks of bleeding, infection and bladder injury.  All questions were answered and he desires to proceed.  Will plan post  resection intravesical gemcitabine   Ruhi Kopke C Amybeth Sieg, MD 

## 2022-11-25 NOTE — Progress Notes (Signed)
   11/25/22  CC:  Chief Complaint  Patient presents with   Cysto   Urologic history -TURBT August 2019 papillary bladder tumor - High-grade urothelial carcinoma Ta - Induction BCG completed November 2019 -TURBT 10/13/2019 10 mm papillary lesion right bladder neck, received post resection gemcitabine -Pathology Ta urothelial carcinoma (low-grade) -TURBT 12/2020 for 5 mm recurrent tumor posterior wall -Pathology Ta low-grade urothelial carcinoma -Underwent 6-week course of intravesical gemcitabine and oncology   HPI: 72 y.o. male who completed his 6-week course of intravesical gemcitabine however did not follow-up with urology and has not been seen since his 2022.  He was contacted for follow-up cystoscopy.  He has no complaints  Blood pressure 120/82, pulse 92, height 6' (1.829 m), weight 279 lb (126.6 kg). NED. A&Ox3.   No respiratory distress   Abd soft, NT, ND Normal phallus with bilateral descended testicles  Cystoscopy Procedure Note  Patient identification was confirmed, informed consent was obtained, and patient was prepped using Betadine solution.  Lidocaine jelly was administered per urethral meatus.     Pre-Procedure: - Inspection reveals a normal caliber urethral meatus.  Procedure: The flexible cystoscope was introduced without difficulty - No urethral strictures/lesions are present. -  Mild lateral lobe enlargement  prostate  - Normal bladder neck - Bilateral ureteral orifices identified - Bladder mucosa  reveals ~ 1 cm papillary tumor bladder neck at 6:00 - No bladder stones -Moderate trabeculation  Retroflexion shows bladder neck tumor as above   Post-Procedure: - Patient tolerated the procedure well  Assessment/ Plan: Recurrent papillary bladder tumor Schedule TURBT.  The procedure was discussed in detail including potential risks of bleeding, infection and bladder injury.  All questions were answered and he desires to proceed.  Will plan post  resection intravesical gemcitabine   Riki Altes, MD

## 2022-11-26 ENCOUNTER — Telehealth: Payer: Self-pay

## 2022-11-26 ENCOUNTER — Other Ambulatory Visit: Payer: Medicare Other

## 2022-11-26 DIAGNOSIS — C680 Malignant neoplasm of urethra: Secondary | ICD-10-CM | POA: Diagnosis not present

## 2022-11-26 DIAGNOSIS — C679 Malignant neoplasm of bladder, unspecified: Secondary | ICD-10-CM

## 2022-11-26 DIAGNOSIS — R3915 Urgency of urination: Secondary | ICD-10-CM | POA: Diagnosis not present

## 2022-11-26 LAB — URINALYSIS, COMPLETE
Bilirubin, UA: NEGATIVE
Glucose, UA: NEGATIVE
Leukocytes,UA: NEGATIVE
Nitrite, UA: NEGATIVE
Protein,UA: NEGATIVE
Specific Gravity, UA: 1.015 (ref 1.005–1.030)
Urobilinogen, Ur: 1 mg/dL (ref 0.2–1.0)
pH, UA: 6 (ref 5.0–7.5)

## 2022-11-26 LAB — MICROSCOPIC EXAMINATION

## 2022-11-26 NOTE — Telephone Encounter (Signed)
I spoke with Francis Jones. We have discussed possible surgery dates and Tuesday June 25th, 2024 was agreed upon by all parties. Patient given information about surgery date, what to expect pre-operatively and post operatively.  We discussed that a Pre-Admission Testing office will be calling to set up the pre-op visit that will take place prior to surgery, and that these appointments are typically done over the phone with a Pre-Admissions RN. Informed patient that our office will communicate any additional care to be provided after surgery. Patients questions or concerns were discussed during our call. Advised to call our office should there be any additional information, questions or concerns that arise. Patient verbalized understanding.

## 2022-11-26 NOTE — Progress Notes (Signed)
   [facility] Surgical Posting Form  Surgery Date: Date: 12/04/2022  Surgeon: Dr. Irineo Axon, MD  Inpt ( No  )   Outpt (Yes)   Obs ( No  )   Diagnosis: C67.9 Urothelial Carcinoma of Bladder  -CPT: 16109, [phone]  Surgery: Transurethral Resection of Bladder Tumor with Intravesical Instillation of Gemcitabine  Stop Anticoagulations: Yes and hold ASA  Cardiac/Medical/Pulmonary Clearance needed: no  *Orders entered into EPIC  Date: 11/26/22   *Case booked in EPIC  Date: 11/26/22  *Notified pt of Surgery: Date: 11/26/22  PRE-OP UA & CX: yes, will obtain in clinic today 11/26/2022  *Placed into Prior Authorization Work Angela Nevin Date: 11/26/22  Assistant/laser/rep:No               .

## 2022-11-27 ENCOUNTER — Encounter: Payer: Self-pay | Admitting: Urology

## 2022-11-27 ENCOUNTER — Other Ambulatory Visit: Payer: Self-pay

## 2022-11-27 DIAGNOSIS — I4891 Unspecified atrial fibrillation: Secondary | ICD-10-CM

## 2022-11-27 MED ORDER — METOPROLOL SUCCINATE ER 25 MG PO TB24
25.0000 mg | ORAL_TABLET | Freq: Every day | ORAL | 1 refills | Status: DC
Start: 2022-11-27 — End: 2023-01-10

## 2022-11-28 ENCOUNTER — Encounter
Admission: RE | Admit: 2022-11-28 | Discharge: 2022-11-28 | Disposition: A | Discharge status: Home / Self Care | Payer: Medicare Other | Source: Ambulatory Visit | Attending: Urology | Admitting: Urology

## 2022-11-28 ENCOUNTER — Other Ambulatory Visit: Payer: Self-pay

## 2022-11-28 ENCOUNTER — Encounter: Payer: Self-pay | Admitting: Urology

## 2022-11-28 ENCOUNTER — Telehealth: Payer: Self-pay | Admitting: *Deleted

## 2022-11-28 DIAGNOSIS — R0602 Shortness of breath: Secondary | ICD-10-CM

## 2022-11-28 DIAGNOSIS — I4891 Unspecified atrial fibrillation: Secondary | ICD-10-CM

## 2022-11-28 DIAGNOSIS — Z01812 Encounter for preprocedural laboratory examination: Secondary | ICD-10-CM

## 2022-11-28 HISTORY — DX: Unspecified osteoarthritis, unspecified site: M19.90

## 2022-11-28 LAB — CULTURE, URINE COMPREHENSIVE

## 2022-11-28 NOTE — Patient Instructions (Addendum)
Your procedure is scheduled on: 12/04/22 - Tuesday Report to the Registration Desk on the 1st floor of the [facility]. To find out your arrival time, please call [phone] between 1PM - 3PM on: 12/03/22 - Monday If your arrival time is 6:00 am, do not arrive before that time as the [facility] entrance doors do not open until 6:00 am.  REMEMBER: Instructions that are not followed completely may result in serious medical risk, up to and including death; or upon the discretion of your surgeon and anesthesiologist your surgery may need to be rescheduled.  Do not eat food or drink any liquids after midnight the night before surgery.  No gum chewing or hard candies.   One week prior to surgery: Stop Anti-inflammatories (NSAIDS) such as Advil, Aleve, Ibuprofen, Motrin, Naproxen, Naprosyn and Aspirin based products such as Excedrin, Goody's Powder, BC Powder.  Stop ANY OVER THE COUNTER supplements until after surgery.  You may however, continue to take Tylenol if needed for pain up until the day of surgery.   TAKE ONLY THESE MEDICATIONS THE MORNING OF SURGERY WITH A SIP OF WATER:  metoprolol succinate (TOPROL XL)    HOLD Aspirin 3 days prior to your surgery.   No Alcohol for 24 hours before or after surgery.  No Smoking including e-cigarettes for 24 hours before surgery.  No chewable tobacco products for at least 6 hours before surgery.  No nicotine patches on the day of surgery.  Do not use any "recreational" drugs for at least a week (preferably 2 weeks) before your surgery.  Please be advised that the combination of cocaine and anesthesia may have negative outcomes, up to and including death. If you test positive for cocaine, your surgery will be cancelled.  On the morning of surgery brush your teeth with toothpaste and water, you may rinse your mouth with mouthwash if you wish. Do not swallow any toothpaste or mouthwash.  Do not wear jewelry, make-up, hairpins, clips or  nail polish.  Do not wear lotions, powders, or perfumes.   Do not shave body hair from the neck down 48 hours before surgery.  Contact lenses, hearing aids and dentures may not be worn into surgery.  Do not bring valuables to the hospital. [facility] is not responsible for any missing/lost belongings or valuables.   Notify your doctor if there is any change in your medical condition (cold, fever, infection).  Wear comfortable clothing (specific to your surgery type) to the hospital.  After surgery, you can help prevent lung complications by doing breathing exercises.  Take deep breaths and cough every 1-2 hours. Your doctor may order a device called an Incentive Spirometer to help you take deep breaths. When coughing or sneezing, hold a pillow firmly against your incision with both hands. This is called "splinting." Doing this helps protect your incision. It also decreases belly discomfort.  If you are being admitted to the hospital overnight, leave your suitcase in the car. After surgery it may be brought to your room.  In case of increased patient census, it may be necessary for you, the patient, to continue your postoperative care in the Same Day Surgery department.  If you are being discharged the day of surgery, you will not be allowed to drive home. You will need a responsible individual to drive you home and stay with you for 24 hours after surgery.   If you are taking public transportation, you will need to have a responsible individual with you.  Please call the Pre-admissions Testing Dept. at [phone] if you have any questions about these instructions.  Surgery Visitation Policy:  Patients having surgery or a procedure may have two visitors.  Children under the age of 3 must have an adult with them who is not the patient.  Inpatient Visitation:    Visiting hours are 7 a.m. to 8 p.m. Up to four visitors are allowed at one time in a patient room. The visitors may  rotate out with other people during the day.  One visitor age 23 or older may stay with the patient overnight and must be in the room by 8 p.m.

## 2022-11-28 NOTE — Telephone Encounter (Signed)
1. What type of surgery is being performed?  TRANSURETHRAL RESECTION OF BLADDER TUMOR (TURBT); BLADDER INSTILLATION OF GEMCITABINE   2. When is this surgery scheduled?  12/04/2022    3. Type of clearance being requested (medical, pharmacy, both)?  MEDICAL    4. Are there any medications that need to be held prior to surgery?  ASA   5. Practice name and name of physician performing surgery?  Performing surgeon: Dr. Irineo Axon, MD  Requesting clearance: Quentin Mulling, FNP-C     6. Anesthesia type (none, local, MAC, general)?  GENERAL   7. What is the office phone and fax number?   Phone: [phone]  Fax: [phone]

## 2022-11-28 NOTE — Telephone Encounter (Signed)
-----   Message from Verlee Monte, NP sent at 11/28/2022  1:52 PM EDT ----- Regarding: Request for pre-operative cardiac clearance Request for pre-operative cardiac clearance:  1. What type of surgery is being performed?  TRANSURETHRAL RESECTION OF BLADDER TUMOR (TURBT); BLADDER INSTILLATION OF GEMCITABINE    2. When is this surgery scheduled?  12/04/2022  3. Type of clearance being requested (medical, pharmacy, both)? MEDICAL   4. Are there any medications that need to be held prior to surgery? ASA  5. Practice name and name of physician performing surgery?  Performing surgeon: Dr. Irineo Axon, MD Requesting clearance: Quentin Mulling, FNP-C    6. Anesthesia type (none, local, MAC, general)? GENERAL  7. What is the office phone and fax number?   Phone: [phone] Fax: [phone]  ATTENTION: Unable to create telephone message as per your standard workflow. Directed by [facility] providers to send requests for cardiac clearance to this pool for appropriate distribution to provider covering pre-operative clearances.   Quentin Mulling, MSN, APRN, FNP-C, CEN [facility]  Peri-operative Services Nurse Practitioner Phone: [phone] 11/28/22 1:52 PM

## 2022-11-28 NOTE — Telephone Encounter (Signed)
Pt has been added to the preop schedule due to the time of pt's surgery, Friday 11/30/22 3:20. Consent on file / medications reconciled.

## 2022-11-28 NOTE — Telephone Encounter (Signed)
Pt has been added to the preop schedule due to the time of pt's surgery, Friday 11/30/22 3:20. Consent on file / medications reconciled.    Patient Consent for Virtual Visit        Francis Jones has provided verbal consent on 11/28/2022 for a virtual visit (video or telephone).   CONSENT FOR VIRTUAL VISIT FOR:  Francis Jones  By participating in this virtual visit I agree to the following:  I hereby voluntarily request, consent and authorize [facility] and its employed or contracted physicians, physician assistants, nurse practitioners or other licensed health care professionals (the Practitioner), to provide me with telemedicine health care services (the "Services") as deemed necessary by the treating Practitioner. I acknowledge and consent to receive the Services by the Practitioner via telemedicine. I understand that the telemedicine visit will involve communicating with the Practitioner through live audiovisual communication technology and the disclosure of certain medical information by electronic transmission. I acknowledge that I have been given the opportunity to request an in-person assessment or other available alternative prior to the telemedicine visit and am voluntarily participating in the telemedicine visit.  I understand that I have the right to withhold or withdraw my consent to the use of telemedicine in the course of my care at any time, without affecting my right to future care or treatment, and that the Practitioner or I may terminate the telemedicine visit at any time. I understand that I have the right to inspect all information obtained and/or recorded in the course of the telemedicine visit and may receive copies of available information for a reasonable fee.  I understand that some of the potential risks of receiving the Services via telemedicine include:  Delay or interruption in medical evaluation due to technological equipment failure or  disruption; Information transmitted may not be sufficient (e.g. poor resolution of images) to allow for appropriate medical decision making by the Practitioner; and/or  In rare instances, security protocols could fail, causing a breach of personal health information.  Furthermore, I acknowledge that it is my responsibility to provide information about my medical history, conditions and care that is complete and accurate to the best of my ability. I acknowledge that Practitioner's advice, recommendations, and/or decision may be based on factors not within their control, such as incomplete or inaccurate data provided by me or distortions of diagnostic images or specimens that may result from electronic transmissions. I understand that the practice of medicine is not an exact science and that Practitioner makes no warranties or guarantees regarding treatment outcomes. I acknowledge that a copy of this consent can be made available to me via my patient portal [facility] MyChart), or I can request a printed copy by calling the office of [facility].    I understand that my insurance will be billed for this visit.   I have read or had this consent read to me. I understand the contents of this consent, which adequately explains the benefits and risks of the Services being provided via telemedicine.  I have been provided ample opportunity to ask questions regarding this consent and the Services and have had my questions answered to my satisfaction. I give my informed consent for the services to be provided through the use of telemedicine in my medical care

## 2022-11-28 NOTE — Progress Notes (Signed)
Perioperative / Anesthesia Services  Pre-Admission Testing Clinical Review / Preoperative Anesthesia Consult  Date: 12/03/22  Patient Demographics:  Name: Francis Jones DOB:   1951/04/19 MRN:   [id number]  Planned Surgical Procedure(s):    Case: [id number] Date/Time: 12/04/22 1009   Procedures:      TRANSURETHRAL RESECTION OF BLADDER TUMOR (TURBT)     BLADDER INSTILLATION OF GEMCITABINE   Anesthesia type: Choice   Pre-op diagnosis: Bladder Tumor   Location: [facility] OR ROOM 10 / [facility]   Surgeons: Riki Altes, MD     NOTE: Available PAT nursing documentation and vital signs have been reviewed. Clinical nursing staff has updated patient's PMH/PSHx, current medication list, and drug allergies/intolerances to ensure comprehensive history available to assist in medical decision making as it pertains to the aforementioned surgical procedure and anticipated anesthetic course. Extensive review of available clinical information personally performed. Francis Jones PMH and PSHx updated with any diagnoses/procedures that  may have been inadvertently omitted during his intake with the pre-admission testing department's nursing staff.  Clinical Discussion:  Francis Jones is a 72 y.o. male who is submitted for pre-surgical anesthesia review and clearance prior to him undergoing the above procedure. Patient is a Former Smoker (quit 06/1977). Pertinent PMH includes: coronary calcification, fibrillation, aortic atherosclerosis, HLD, dyspnea, urothelial carcinoma of the bladder, OA.  Patient is followed by cardiology Azucena Cecil, MD). He was last seen in the cardiology clinic on 01/26/2022; notes reviewed. At the time of his clinic visit, patient doing well overall from a cardiovascular perspective.  Patient did report occasional episodes of exertional dyspnea, however this symptom was reported to be stable and at baseline.  Patient denied any chest pain, PND, orthopnea,  palpitations, significant peripheral edema, weakness, fatigue, vertiginous symptoms, or presyncope/syncope. Patient with a past medical history significant for cardiovascular diagnoses. Documented physical exam was grossly benign, providing no evidence of acute exacerbation and/or decompensation of the patient's known cardiovascular conditions.  TTE performed on 01/10/2021 revealed a normal left ventricular systolic function with an EF of 55 to 60%.  There were no regional wall motion abnormalities.  There was mild LVH noted.  Right ventricular size was mildly enlarged with normal function.  Tricuspid regurgitation signal and adequate for assessing PA pressures.  Mild thickening of the aortic valve thickening/sclerosis noted.  All transvalvular gradients were noted to be normal providing no evidence suggestive of valvular stenosis.  Aorta normal in size with no evidence of aneurysmal dilatation.  Myocardial perfusion imaging study was performed on 01/31/2021 revealing normal left ventricular systolic function with an EF of 55 to 60%.  There were mild coronary artery calcifications and aortic atherosclerosis noted.  There was no evidence of stress-induced myocardial ischemia or arrhythmia; no scintigraphic evidence of scar.  Study interpreted be normal and low risk overall.  Patient with an atrial fibrillation diagnosis; CHA2DS2-VASc Score = 2 (age, aortic atherosclerosis). His rate and rhythm are currently being maintained on oral metoprolol succinate.  Both cardiology and electrophysiology have discussed potential DCCV procedure in the past, however patient has declined.  Additionally, chronic oral anticoagulation therapy has been recommended, however patient also declining this intervention.  Blood pressure well-controlled at 110/80 mmHg on currently prescribed beta-blocker (metoprolol succinate) monotherapy.  Patient is taking an omega-3 fatty acid capsule daily for his HLD diagnosis and ASCVD prevention.   He is not diabetic.  Patient does not have an OSAH diagnosis.  Functional capacity, per the DASI, places patient at the ability to achieve  at least 4 METS of physical activity without experiencing any significant degrees of anginal/anginal equivalent symptoms.  No changes were made to his medication regimen.  Patient to follow-up with outpatient cardiology in 1 year or sooner if needed.  Francis Jones is scheduled for a TRANSURETHRAL RESECTION OF BLADDER TUMOR (TURBT); BLADDER INSTILLATION OF GEMCITABINE on 12/04/2022 with Dr. Irineo Axon, MD.  Given patient's past medical history significant for cardiovascular diagnoses, presurgical cardiac clearance was sought by the PAT team. Per cardiology, "RCRI is a class I risk, 0.4% risk of major cardiac event. He is able to complete >4 METS of physical activity. Given past medical history and time since last visit, based on ACC/AHA guidelines, Francis Jones would be at ACCEPTABLE risk for the planned procedure without further cardiovascular testing".  In review of his medication reconciliation, it is noted that patient is currently on prescribed daily antithrombotic therapy. He has been instructed on recommendations for holding his daily low-dose ASA for 3 days prior to his procedure with plans to restart as soon as postoperative bleeding risk felt to be minimized by his attending surgeon. The patient has been instructed that his last dose of his ASA should be on 11/30/2022.  Patient reports previous perioperative complications with anesthesia in the past. Patient has a PMH (+) for PONV. Symptoms and history of PONV will be discussed with patient by anesthesia team on the day of her procedure. Interventions will be ordered as deemed necessary based on patient's individual care needs as determined by anesthesiologist. In review of the available records, it is noted that patient underwent a general anesthetic course here at [facility] ([facility]) in 12/2020 without documented complications.      11/23/2022   10:13 AM 01/26/2022   11:12 AM 09/13/2021    9:53 AM  Vitals with BMI  Height 6\' 0"  6\' 0"  6\' 1"   Weight 279 lbs 286 lbs 281 lbs  BMI 37.83 38.78 37.08  Systolic 120 110 161  Diastolic 82 80 82  Pulse 92 95 89    Providers/Specialists:   NOTE: Primary physician provider listed below. Patient may have been seen by APP or partner within same practice.   PROVIDER ROLE / SPECIALTY LAST Winfield Cunas, MD Urology (Surgeon) 11/25/2022  Bosie Clos, MD Primary Care Provider 06/24/2022  Debbe Odea, MD Cardiology 01/26/2022; update preop APP call on 11/30/2022  Steffanie Dunn, MD Electrophysiology 09/13/2021  Louretta Shorten, MD Medical Oncology 01/30/2021   Allergies:  Hydrocodone-acetaminophen and Oxycodone  Current Home Medications:   No current facility-administered medications for this encounter.    acetaminophen (TYLENOL) 650 MG CR tablet   aspirin EC 81 MG tablet   Carboxymethylcellul-Glycerin (REFRESH OPTIVE OP)   cholecalciferol (VITAMIN D3) 25 MCG (1000 UNIT) tablet   ibuprofen (ADVIL,MOTRIN) 200 MG tablet   loratadine (CLARITIN) 10 MG tablet   metoprolol succinate (TOPROL XL) 25 MG 24 hr tablet   MULTIPLE VITAMIN PO   Omega-3 Fatty Acids (FISH OIL) 1200 MG CAPS   SUPER B COMPLEX/C CAPS   History:   Past Medical History:  Diagnosis Date   Allergic rhinitis    Aortic atherosclerosis (HCC)    Arthritis    Atrial fibrillation (HCC) 12/16/2020   a.) CHA2DS2VASc = 2 (age, vascular disease history);  b.) rate/rhythm maintained on oral metoprolol succinate; has refused DCCV and chronic oral anticoagulation   Coronary artery calcification seen on CT scan    Diverticulosis  Dyspnea    HLD (hyperlipidemia)    PONV (postoperative nausea and vomiting)    Urothelial carcinoma of bladder (HCC) 02/04/2018   a.) s/p TURBT 02/04/2018: pathology (+) high grade  non-invasive urothelial carcinoma (no muscularis propria invasion); b.) s/p TURBT 10/13/2019 --> pathology (+): low grade non-invasive urothelial carcinoma (no muscularis propria invasion); c.) s/p TURBT 12/27/2020: pathology (+) low grade non-invasive urothelial carcinoma (no muscularis propria invasion)   Past Surgical History:  Procedure Laterality Date   APPENDECTOMY  1973   CYSTOSCOPY WITH BIOPSY N/A 12/27/2020   Procedure: CYSTOSCOPY WITH BLADDER BIOPSY;  Surgeon: Riki Altes, MD;  Location: [facility] ORS;  Service: Urology;  Laterality: N/A;   CYSTOSCOPY WITH FULGERATION N/A 12/27/2020   Procedure: CYSTOSCOPY WITH FULGERATION;  Surgeon: Riki Altes, MD;  Location: [facility] ORS;  Service: Urology;  Laterality: N/A;   CYSTOSCOPY WITH STENT PLACEMENT Right 02/04/2018   Procedure: CYSTOSCOPY;  Surgeon: Riki Altes, MD;  Location: [facility] ORS;  Service: Urology;  Laterality: Right;   FINGER SURGERY Left 2001   titanium in left 5th finger   HERNIA REPAIR  2000   SHOULDER SURGERY Left 2009   TRANSURETHRAL RESECTION OF BLADDER TUMOR N/A 02/04/2018   Procedure: TRANSURETHRAL RESECTION OF BLADDER TUMOR (TURBT) with gemcitabine;  Surgeon: Riki Altes, MD;  Location: [facility] ORS;  Service: Urology;  Laterality: N/A;   TRANSURETHRAL RESECTION OF BLADDER TUMOR N/A 10/13/2019   Procedure: TRANSURETHRAL RESECTION OF BLADDER TUMOR (TURBT) with gemcitabine;  Surgeon: Riki Altes, MD;  Location: [facility] ORS;  Service: Urology;  Laterality: N/A;   VASECTOMY  1987   Family History  Problem Relation Age of Onset   Alzheimer's disease Father    Social History   Tobacco Use   Smoking status: Former    Types: Cigarettes    Quit date: 1979    Years since quitting: 45.5   Smokeless tobacco: Never  Vaping Use   Vaping Use: Never used  Substance Use Topics   Alcohol use: Yes    Alcohol/week: 2.0 standard drinks of alcohol    Types: 1 Glasses of wine, 1 Cans of beer per week    Comment: occasional    Drug use: Not Currently    Types: LSD, Opium    Comment: as a teenager    Pertinent Clinical Results:  LABS:   No visits with results within 3 Day(s) from this visit.  Latest known visit with results is:  Hospital Outpatient Visit on 11/29/2022  Component Date Value Ref Range Status   WBC 11/29/2022 8.0  4.0 - 10.5 K/uL Final   RBC 11/29/2022 5.81  4.22 - 5.81 MIL/uL Final   Hemoglobin 11/29/2022 17.3 (H)  13.0 - 17.0 g/dL Final   HCT 56/21/3086 51.6  39.0 - 52.0 % Final   MCV 11/29/2022 88.8  80.0 - 100.0 fL Final   MCH 11/29/2022 29.8  26.0 - 34.0 pg Final   MCHC 11/29/2022 33.5  30.0 - 36.0 g/dL Final   RDW 57/84/6962 11.9  11.5 - 15.5 % Final   Platelets 11/29/2022 234  150 - 400 K/uL Final   nRBC 11/29/2022 0.0  0.0 - 0.2 % Final   Performed at [facility], [address] [place], [place] [address]   Sodium 11/29/2022 138  135 - 145 mmol/L Final   Potassium 11/29/2022 3.9  3.5 - 5.1 mmol/L Final   Chloride 11/29/2022 104  98 - 111 mmol/L Final   CO2 11/29/2022 24  22 - 32  mmol/L Final   Glucose, Bld 11/29/2022 183 (H)  70 - 99 mg/dL Final   Glucose reference range applies only to samples taken after fasting for at least 8 hours.   BUN 11/29/2022 11  8 - 23 mg/dL Final   Creatinine, Ser 11/29/2022 0.75  0.61 - 1.24 mg/dL Final   Calcium 16/03/9603 9.0  8.9 - 10.3 mg/dL Final   GFR, Estimated 11/29/2022 >60  >60 mL/min Final   Comment: (NOTE) Calculated using the CKD-EPI Creatinine Equation (2021)    Anion gap 11/29/2022 10  5 - 15 Final   Performed at [facility], [address] [place], [place] [address]    ECG: Date: 11/29/2022 Time ECG obtained: 1050 AM Rate: 95 bpm Rhythm: atrial fibrillation Axis (leads I and aVF): Normal Intervals: QRS 90 ms. QTc 434 ms. ST segment and T wave changes: No evidence of acute ST segment elevation or depression Comparison: Similar to previous tracing obtained on 01/26/2022   IMAGING /  PROCEDURES: MYOCARDIAL PERFUSION IMAGING STUDY (LEXISCAN) performed on 01/31/2021 Normal left ventricular systolic function with an EF of 55 to 60% Mild coronary artery calcification and aortic atherosclerosis noted. Indeterminate fat and soft tissue density at the RIGHT lung base appears to be similar to the CT abdomen/pelvis from 01/17/2018, allowing for differences in technique. No evidence of stress-induced myocardial ischemia or arrhythmia; no scintigraphic evidence of scar. Normal low risk study  TRANSTHORACIC ECHOCARDIOGRAM performed on 01/10/2021 Left ventricular ejection fraction, by estimation, is 55-60%. The left ventricle has normal function. The left ventricle has no regional wall motion abnormalities. There is mild left ventricular hypertrophy. Left ventricular diastolic function could not be evaluated.  Right ventricular systolic function is normal. The right ventricular size is mildly enlarged. Tricuspid regurgitation signal is inadequate for assessing PA pressure.  The mitral valve is grossly normal. No evidence of mitral valve regurgitation. No evidence of mitral stenosis.  The aortic valve is tricuspid. There is mild thickening of the aortic valve. Aortic valve regurgitation is not visualized. Mild aortic valve sclerosis is present, with no evidence of aortic valve stenosis.  The inferior vena cava is normal in size with greater than 50% respiratory variability, suggesting right atrial pressure of 3 mmHg.   Impression and Plan:  Francis Jones has been referred for pre-anesthesia review and clearance prior to him undergoing the planned anesthetic and procedural courses. Available labs, pertinent testing, and imaging results were personally reviewed by me in preparation for upcoming operative/procedural course. [facility] medical record has been updated following extensive record review and patient interview with PAT staff.   This patient has been appropriately cleared by  cardiology with an overall ACCEPTABLE risk of experiencing significant perioperative cardiovascular complications. Based on clinical review performed today (12/03/22), barring any significant acute changes in the patient's overall condition, it is anticipated that he will be able to proceed with the planned surgical intervention. Any acute changes in clinical condition may necessitate his procedure being postponed and/or cancelled. Patient will meet with anesthesia team (MD and/or CRNA) on the day of his procedure for preoperative evaluation/assessment. Questions regarding anesthetic course will be fielded at that time.   Pre-surgical instructions were reviewed with the patient during his PAT appointment, and questions were fielded to satisfaction by PAT clinical staff. He has been instructed on which medications that he will need to hold prior to surgery, as well as the ones that have been deemed safe/appropriate to take on the day of his procedure. As part of  the general education provided by PAT, patient made aware both verbally and in writing, that he would need to abstain from the use of any illegal substances during his perioperative course.  He was advised that failure to follow the provided instructions could necessitate case cancellation or result in serious perioperative complications up to and including death. Patient encouraged to contact PAT and/or his surgeon's office to discuss any questions or concerns that may arise prior to surgery; verbalized understanding.   Quentin Mulling, MSN, APRN, FNP-C, CEN [facility]  Peri-operative Services Nurse Practitioner Phone: [phone] Fax: [phone] 12/03/22 8:16 AM  NOTE: This note has been prepared using Scientist, clinical (histocompatibility and immunogenetics). Despite my best ability to proofread, there is always the potential that unintentional transcriptional errors may still occur from this process.

## 2022-11-28 NOTE — Telephone Encounter (Signed)
   Name: Francis Jones  DOB: Sep 05, 1950  MRN: [id number]  Primary Cardiologist: Debbe Odea, MD   Preoperative team, please contact this patient and set up a phone call appointment for further preoperative risk assessment. Please obtain consent and complete medication review. Thank you for your help.  I confirm that guidance regarding antiplatelet and oral anticoagulation therapy has been completed and, if necessary, noted below.  Per office protocol, if patient is without any new symptoms or concerns at the time of their virtual visit, he may hold Aspirin for 5-7 days prior to procedure. Please resume Aspirin as soon as possible postprocedure, at the discretion of the surgeon.     Joylene Grapes, NP 11/28/2022, 3:32 PM [facility]

## 2022-11-29 ENCOUNTER — Encounter
Admission: RE | Admit: 2022-11-29 | Discharge: 2022-11-29 | Disposition: A | Discharge status: Home / Self Care | Payer: Medicare Other | Source: Ambulatory Visit | Attending: Urology | Admitting: Urology

## 2022-11-29 DIAGNOSIS — I4891 Unspecified atrial fibrillation: Secondary | ICD-10-CM | POA: Diagnosis not present

## 2022-11-29 DIAGNOSIS — Z01818 Encounter for other preprocedural examination: Secondary | ICD-10-CM | POA: Insufficient documentation

## 2022-11-29 DIAGNOSIS — Z01812 Encounter for preprocedural laboratory examination: Secondary | ICD-10-CM

## 2022-11-29 DIAGNOSIS — R0602 Shortness of breath: Secondary | ICD-10-CM | POA: Insufficient documentation

## 2022-11-29 DIAGNOSIS — Z01810 Encounter for preprocedural cardiovascular examination: Secondary | ICD-10-CM | POA: Diagnosis not present

## 2022-11-29 LAB — BASIC METABOLIC PANEL
Anion gap: 10 (ref 5–15)
BUN: 11 mg/dL (ref 8–23)
CO2: 24 mmol/L (ref 22–32)
Calcium: 9 mg/dL (ref 8.9–10.3)
Chloride: 104 mmol/L (ref 98–111)
Creatinine, Ser: 0.75 mg/dL (ref 0.61–1.24)
GFR, Estimated: >60 mL/min (ref >60)
Glucose, Bld: 183 mg/dL — ABNORMAL HIGH (ref 70–99)
Potassium: 3.9 mmol/L (ref 3.5–5.1)
Sodium: 138 mmol/L (ref 135–145)

## 2022-11-29 LAB — CBC
HCT: 51.6 % (ref 39.0–52.0)
Hemoglobin: 17.3 g/dL — ABNORMAL HIGH (ref 13.0–17.0)
MCH: 29.8 pg (ref 26.0–34.0)
MCHC: 33.5 g/dL (ref 30.0–36.0)
MCV: 88.8 fL (ref 80.0–100.0)
Platelets: 234 10*3/uL (ref 150–400)
RBC: 5.81 MIL/uL (ref 4.22–5.81)
RDW: 11.9 % (ref 11.5–15.5)
WBC: 8 10*3/uL (ref 4.0–10.5)
nRBC: 0 % (ref 0.0–0.2)

## 2022-11-29 NOTE — Progress Notes (Signed)
Virtual Visit via Telephone Note   Because of Francis Jones's co-morbid illnesses, he is at least at moderate risk for complications without adequate follow up.  This format is felt to be most appropriate for this patient at this time.  The patient did not have access to video technology/had technical difficulties with video requiring transitioning to audio format only (telephone).  All issues noted in this document were discussed and addressed.  No physical exam could be performed with this format.  Please refer to the patient's chart for his consent to telehealth for [facility].  Evaluation Performed:  Preoperative cardiovascular risk assessment _____________   Date:  11/29/2022   Patient ID:  Francis Jones, DOB August 27, 1950, MRN [id number] Patient Location:  Home Provider location:   Office  Primary Care Provider:  Bosie Clos, MD Primary Cardiologist:  Debbe Odea, MD  Chief Complaint / Patient Profile   72 y.o. y/o male with a h/o hyperlipidemia, essential hypertension, atrial fibrillation who is pending TURBT and presents today for telephonic preoperative cardiovascular risk assessment.  History of Present Illness    Francis Jones is a 72 y.o. male who presents via audio/video conferencing for a telehealth visit today.  Pt was last seen in cardiology clinic on 01/26/22 by Dr. Azucena Cecil.  At that time Francis Jones was doing well .  The patient is now pending procedure as outlined above. Since his last visit, he continues to be stable from a cardiac standpoint.  Today he denies chest pain, shortness of breath, lower extremity edema, fatigue, palpitations, melena, hematuria, hemoptysis, diaphoresis, weakness, presyncope, syncope, orthopnea, and PND.   Past Medical History    Past Medical History:  Diagnosis Date   Allergic rhinitis    Aortic atherosclerosis (HCC)    Arthritis    Atrial fibrillation (HCC) 12/16/2020   a.) CHA2DS2VASc = 2 (age,  vascular disease history);  b.) rate/rhythm maintained on oral metoprolol succinate; has refused DCCV and chronic oral anticoagulation   Coronary artery calcification seen on CT scan    Diverticulosis    Dyspnea    HLD (hyperlipidemia)    PONV (postoperative nausea and vomiting)    Urothelial carcinoma of bladder (HCC) 02/04/2018   a.) s/p TURBT 02/04/2018: pathology (+) high grade non-invasive urothelial carcinoma (no muscularis propria invasion); b.) s/p TURBT 10/13/2019 --> pathology (+): low grade non-invasive urothelial carcinoma (no muscularis propria invasion); c.) s/p TURBT 12/27/2020: pathology (+) low grade non-invasive urothelial carcinoma (no muscularis propria invasion)   Past Surgical History:  Procedure Laterality Date   APPENDECTOMY  1973   CYSTOSCOPY WITH BIOPSY N/A 12/27/2020   Procedure: CYSTOSCOPY WITH BLADDER BIOPSY;  Surgeon: Riki Altes, MD;  Location: [facility] ORS;  Service: Urology;  Laterality: N/A;   CYSTOSCOPY WITH FULGERATION N/A 12/27/2020   Procedure: CYSTOSCOPY WITH FULGERATION;  Surgeon: Riki Altes, MD;  Location: [facility] ORS;  Service: Urology;  Laterality: N/A;   CYSTOSCOPY WITH STENT PLACEMENT Right 02/04/2018   Procedure: CYSTOSCOPY;  Surgeon: Riki Altes, MD;  Location: [facility] ORS;  Service: Urology;  Laterality: Right;   FINGER SURGERY Left 2001   titanium in left 5th finger   HERNIA REPAIR  2000   SHOULDER SURGERY Left 2009   TRANSURETHRAL RESECTION OF BLADDER TUMOR N/A 02/04/2018   Procedure: TRANSURETHRAL RESECTION OF BLADDER TUMOR (TURBT) with gemcitabine;  Surgeon: Riki Altes, MD;  Location: [facility] ORS;  Service: Urology;  Laterality: N/A;   TRANSURETHRAL RESECTION OF BLADDER TUMOR N/A 10/13/2019  Procedure: TRANSURETHRAL RESECTION OF BLADDER TUMOR (TURBT) with gemcitabine;  Surgeon: Riki Altes, MD;  Location: [facility] ORS;  Service: Urology;  Laterality: N/A;   VASECTOMY  1987    Allergies  Allergies  Allergen Reactions    Hydrocodone-Acetaminophen Nausea And Vomiting   Oxycodone Nausea And Vomiting    Home Medications    Prior to Admission medications   Medication Sig Start Date End Date Taking? Authorizing Provider  acetaminophen (TYLENOL) 650 MG CR tablet Take 650-1,300 mg by mouth every 8 (eight) hours as needed for pain.    [provider]  aspirin EC 81 MG tablet Take 1 tablet (81 mg total) by mouth daily. Swallow whole. 01/26/21   Debbe Odea, MD  Carboxymethylcellul-Glycerin (REFRESH OPTIVE OP) Place 1 drop into both eyes daily as needed (dryness).    [provider]  cholecalciferol (VITAMIN D3) 25 MCG (1000 UNIT) tablet Take 1,000 Units by mouth daily.    [provider]  ibuprofen (ADVIL,MOTRIN) 200 MG tablet Take 200-400 mg by mouth daily as needed for moderate pain.    [provider]  loratadine (CLARITIN) 10 MG tablet Take 10 mg by mouth daily.    [provider]  metoprolol succinate (TOPROL XL) 25 MG 24 hr tablet Take 1 tablet (25 mg total) by mouth daily. PLEASE CALL [phone] TO SCHEDULE YEARLY FOLLOW UP AND FOR FURTHER REFILLS. THANK YOU. 11/27/22   Debbe Odea, MD  MULTIPLE VITAMIN PO Take 1 tablet by mouth daily.     [provider]  Omega-3 Fatty Acids (FISH OIL) 1200 MG CAPS Take 2,400 mg by mouth daily.    [provider]  SUPER B COMPLEX/C CAPS Take 1 capsule by mouth daily.    [provider]    Physical Exam    Vital Signs:  Francis Jones does not have vital signs available for review today.  Given telephonic nature of communication, physical exam is limited. AAOx3. NAD. Normal affect.  Speech and respirations are unlabored.  Accessory Clinical Findings    None  Assessment & Plan    1.  Preoperative Cardiovascular Risk Assessment:TRANSURETHRAL RESECTION OF BLADDER TUMOR (TURBT); BLADDER INSTILLATION OF GEMCITABINE , 12/04/2022 , Dr. Irineo Axon, MD , [phone]      Primary  Cardiologist: Debbe Odea, MD  Chart reviewed as part of pre-operative protocol coverage. Given past medical history and time since last visit, based on ACC/AHA guidelines, Francis Jones would be at acceptable risk for the planned procedure without further cardiovascular testing.   His RCRI is a class I risk, 0.4% risk of major cardiac event.  He is able to complete greater than 4 METS of physical activity.  His aspirin may be held for 5 to 7 days prior to his procedure.  Please resume as soon as hemostasis is achieved.  Patient was advised that if he develops new symptoms prior to surgery to contact our office to arrange a follow-up appointment.  He verbalized understanding.  I will route this recommendation to the requesting party via Epic fax function and remove from pre-op pool.      Time:   Today, I have spent 6 minutes with the patient with telehealth technology discussing medical history, symptoms, and management plan. Prior to his phone evaluation I spent greater than 10 minutes reviewing his past medical history and cardiac medications.    Francis Asters, NP  11/29/2022, 4:41 PM

## 2022-11-30 ENCOUNTER — Ambulatory Visit: Payer: Medicare Other | Attending: Cardiovascular Disease

## 2022-11-30 DIAGNOSIS — Z01810 Encounter for preprocedural cardiovascular examination: Secondary | ICD-10-CM

## 2022-12-01 LAB — CULTURE, URINE COMPREHENSIVE

## 2022-12-04 ENCOUNTER — Encounter: Payer: Self-pay | Admitting: Urology

## 2022-12-04 ENCOUNTER — Ambulatory Visit: Payer: Medicare Other | Admitting: Urgent Care

## 2022-12-04 ENCOUNTER — Ambulatory Visit
Admission: RE | Admit: 2022-12-04 | Discharge: 2022-12-04 | Disposition: A | Discharge status: Home / Self Care | Payer: Medicare Other | Attending: Urology | Admitting: Urology

## 2022-12-04 ENCOUNTER — Encounter
Admission: RE | Disposition: A | Discharge status: Home / Self Care | Payer: Self-pay | Source: Home / Self Care | Attending: Urology

## 2022-12-04 DIAGNOSIS — I2510 Atherosclerotic heart disease of native coronary artery without angina pectoris: Secondary | ICD-10-CM | POA: Insufficient documentation

## 2022-12-04 DIAGNOSIS — C675 Malignant neoplasm of bladder neck: Secondary | ICD-10-CM

## 2022-12-04 DIAGNOSIS — E785 Hyperlipidemia, unspecified: Secondary | ICD-10-CM | POA: Diagnosis not present

## 2022-12-04 DIAGNOSIS — D494 Neoplasm of unspecified behavior of bladder: Secondary | ICD-10-CM | POA: Diagnosis not present

## 2022-12-04 DIAGNOSIS — Z09 Encounter for follow-up examination after completed treatment for conditions other than malignant neoplasm: Secondary | ICD-10-CM | POA: Insufficient documentation

## 2022-12-04 DIAGNOSIS — Z87891 Personal history of nicotine dependence: Secondary | ICD-10-CM | POA: Diagnosis not present

## 2022-12-04 DIAGNOSIS — C679 Malignant neoplasm of bladder, unspecified: Secondary | ICD-10-CM | POA: Insufficient documentation

## 2022-12-04 DIAGNOSIS — I4891 Unspecified atrial fibrillation: Secondary | ICD-10-CM | POA: Diagnosis not present

## 2022-12-04 HISTORY — PX: BLADDER INSTILLATION: SHX6893

## 2022-12-04 HISTORY — DX: Atherosclerotic heart disease of native coronary artery without angina pectoris: I25.10

## 2022-12-04 HISTORY — DX: Hyperlipidemia, unspecified: E78.5

## 2022-12-04 HISTORY — DX: Allergic rhinitis, unspecified: J30.9

## 2022-12-04 HISTORY — DX: Atherosclerosis of aorta: I70.0

## 2022-12-04 HISTORY — DX: Diverticulosis of intestine, part unspecified, without perforation or abscess without bleeding: K57.90

## 2022-12-04 HISTORY — PX: TRANSURETHRAL RESECTION OF BLADDER TUMOR: SHX2575

## 2022-12-04 SURGERY — TURBT (TRANSURETHRAL RESECTION OF BLADDER TUMOR)
Anesthesia: General

## 2022-12-04 MED ORDER — CHLORHEXIDINE GLUCONATE 0.12 % MT SOLN
OROMUCOSAL | Status: AC
  Filled 2022-12-04: qty 15

## 2022-12-04 MED ORDER — ONDANSETRON HCL 4 MG/2ML IJ SOLN
INTRAMUSCULAR | Status: DC | PRN
  Administered 2022-12-04 (×2): 4 mg via INTRAVENOUS

## 2022-12-04 MED ORDER — FAMOTIDINE 20 MG PO TABS
20.0000 mg | ORAL_TABLET | Freq: Once | ORAL | Status: AC
  Administered 2022-12-04: 20 mg via ORAL

## 2022-12-04 MED ORDER — GEMCITABINE CHEMO FOR BLADDER INSTILLATION 2000 MG
INTRAVENOUS | Status: DC | PRN
  Administered 2022-12-04: 2000 mg via INTRAVESICAL

## 2022-12-04 MED ORDER — MIDAZOLAM HCL 2 MG/2ML IJ SOLN
INTRAMUSCULAR | Status: AC
  Filled 2022-12-04: qty 2

## 2022-12-04 MED ORDER — FENTANYL CITRATE (PF) 100 MCG/2ML IJ SOLN
INTRAMUSCULAR | Status: DC | PRN
  Administered 2022-12-04 (×2): 50 ug via INTRAVENOUS

## 2022-12-04 MED ORDER — CHLORHEXIDINE GLUCONATE 0.12 % MT SOLN
15.0000 mL | Freq: Once | OROMUCOSAL | Status: AC
  Administered 2022-12-04: 15 mL via OROMUCOSAL

## 2022-12-04 MED ORDER — CEFAZOLIN SODIUM-DEXTROSE 2-4 GM/100ML-% IV SOLN
INTRAVENOUS | Status: AC
  Filled 2022-12-04: qty 100

## 2022-12-04 MED ORDER — FENTANYL CITRATE (PF) 100 MCG/2ML IJ SOLN
INTRAMUSCULAR | Status: AC
  Filled 2022-12-04: qty 2

## 2022-12-04 MED ORDER — LACTATED RINGERS IV SOLN: INTRAVENOUS | Status: DC

## 2022-12-04 MED ORDER — PROPOFOL 10 MG/ML IV BOLUS
INTRAVENOUS | Status: DC | PRN
  Administered 2022-12-04: 200 mg via INTRAVENOUS

## 2022-12-04 MED ORDER — CEFAZOLIN SODIUM-DEXTROSE 2-4 GM/100ML-% IV SOLN
2.0000 g | INTRAVENOUS | Status: AC
  Administered 2022-12-04: 2 g via INTRAVENOUS
  Administered 2022-12-04: 1 g via INTRAVENOUS

## 2022-12-04 MED ORDER — ORAL CARE MOUTH RINSE: 15.0000 mL | Freq: Once | OROMUCOSAL | Status: AC

## 2022-12-04 MED ORDER — GEMCITABINE CHEMO FOR BLADDER INSTILLATION 2000 MG
2000.0000 mg | Freq: Once | INTRAVENOUS | Status: DC
  Filled 2022-12-04: qty 52.6

## 2022-12-04 MED ORDER — SUGAMMADEX SODIUM 200 MG/2ML IV SOLN
INTRAVENOUS | Status: DC | PRN
  Administered 2022-12-04: 400 mg via INTRAVENOUS

## 2022-12-04 MED ORDER — FAMOTIDINE 20 MG PO TABS
ORAL_TABLET | ORAL | Status: AC
  Filled 2022-12-04: qty 1

## 2022-12-04 MED ORDER — ROCURONIUM BROMIDE 100 MG/10ML IV SOLN
INTRAVENOUS | Status: DC | PRN
  Administered 2022-12-04: 50 mg via INTRAVENOUS
  Administered 2022-12-04: 10 mg via INTRAVENOUS

## 2022-12-04 MED ORDER — LIDOCAINE HCL (CARDIAC) PF 100 MG/5ML IV SOSY
PREFILLED_SYRINGE | INTRAVENOUS | Status: DC | PRN
  Administered 2022-12-04: 100 mg via INTRAVENOUS

## 2022-12-04 MED ORDER — DEXAMETHASONE SODIUM PHOSPHATE 10 MG/ML IJ SOLN
INTRAMUSCULAR | Status: DC | PRN
  Administered 2022-12-04: 10 mg via INTRAVENOUS

## 2022-12-04 MED ORDER — PROPOFOL 10 MG/ML IV BOLUS
INTRAVENOUS | Status: AC
  Filled 2022-12-04: qty 40

## 2022-12-04 MED ORDER — SUCCINYLCHOLINE CHLORIDE 200 MG/10ML IV SOSY
PREFILLED_SYRINGE | INTRAVENOUS | Status: DC | PRN
  Administered 2022-12-04: 120 mg via INTRAVENOUS

## 2022-12-04 MED ORDER — ACETAMINOPHEN 10 MG/ML IV SOLN
INTRAVENOUS | Status: DC | PRN
  Administered 2022-12-04: 1000 mg via INTRAVENOUS

## 2022-12-04 MED ORDER — PHENAZOPYRIDINE HCL 200 MG PO TABS: 200.0000 mg | ORAL_TABLET | Freq: Three times a day (TID) | ORAL | 0 refills | Status: DC | PRN

## 2022-12-04 SURGICAL SUPPLY — 25 items
BAG DRAIN SIEMENS DORNER NS (MISCELLANEOUS) ×1 IMPLANT
BAG DRN NS LF (MISCELLANEOUS) ×1
BAG DRN RND TRDRP ANRFLXCHMBR (UROLOGICAL SUPPLIES) ×1
BAG URINE DRAIN 2000ML AR STRL (UROLOGICAL SUPPLIES) ×1 IMPLANT
CATH FOLEY 2WAY 18X30 (CATHETERS) IMPLANT
CATH FOLEY 2WAY SIL 18X30 (CATHETERS)
DRAPE UTILITY 15X26 TOWEL STRL (DRAPES) ×1 IMPLANT
DRSG TELFA 3X4 N-ADH STERILE (GAUZE/BANDAGES/DRESSINGS) ×1 IMPLANT
ELECT LOOP 22F BIPOLAR SML (ELECTROSURGICAL) ×1
ELECT REM PT RETURN 9FT ADLT (ELECTROSURGICAL)
ELECTRODE LOOP 22F BIPOLAR SML (ELECTROSURGICAL) IMPLANT
ELECTRODE REM PT RTRN 9FT ADLT (ELECTROSURGICAL) IMPLANT
GLOVE BIOGEL PI IND STRL 7.5 (GLOVE) ×1 IMPLANT
GOWN STRL REUS W/ TWL LRG LVL3 (GOWN DISPOSABLE) ×1 IMPLANT
GOWN STRL REUS W/TWL LRG LVL3 (GOWN DISPOSABLE) ×1
GOWN STRL REUS W/TWL XL LVL4 (GOWN DISPOSABLE) ×1 IMPLANT
IV NS IRRIG 3000ML ARTHROMATIC (IV SOLUTION) ×2 IMPLANT
KIT TURNOVER CYSTO (KITS) ×1 IMPLANT
LOOP CUT BIPOLAR 24F LRG (ELECTROSURGICAL) IMPLANT
PACK CYSTO AR (MISCELLANEOUS) ×1 IMPLANT
SET IRRIG Y TYPE TUR BLADDER L (SET/KITS/TRAYS/PACK) ×1 IMPLANT
SURGILUBE 2OZ TUBE FLIPTOP (MISCELLANEOUS) ×1 IMPLANT
SYR TOOMEY IRRIG 70ML (MISCELLANEOUS) ×1
SYRINGE TOOMEY IRRIG 70ML (MISCELLANEOUS) ×1 IMPLANT
WATER STERILE IRR 500ML POUR (IV SOLUTION) ×1 IMPLANT

## 2022-12-04 NOTE — Interval H&P Note (Signed)
History and Physical Interval Note:  CV:RRR Lungs:clear  12/04/2022 10:37 AM  Francis Jones  has presented today for surgery, with the diagnosis of Bladder Tumor.  The various methods of treatment have been discussed with the patient and family. After consideration of risks, benefits and other options for treatment, the patient has consented to  Procedure(s): TRANSURETHRAL RESECTION OF BLADDER TUMOR (TURBT) (N/A) BLADDER INSTILLATION OF GEMCITABINE (N/A) as a surgical intervention.  The patient's history has been reviewed, patient examined, no change in status, stable for surgery.  I have reviewed the patient's chart and labs.  Questions were answered to the patient's satisfaction.     Sonam Huelsmann C Andreal Vultaggio

## 2022-12-04 NOTE — Op Note (Signed)
Preoperative diagnosis: Bladder tumor (1 cm)  Postoperative diagnosis:  Bladder tumor (1 cm)  Procedure:  Cystoscopy Transurethral resection of bladder tumor (< 2 cm) Instillation intravesical gemcitabine  Surgeon: Lorin Picket C. Raeden Schippers, M.D.  Anesthesia: General  Complications: None  Intraoperative findings:  Bladder tumor: ~ 1 cm papillary tumor just inside bladder neck at the 6:00 position  EBL: Minimal  Specimens: Bladder tumor      Indication: Francis Jones is a 72 y.o. male with a history of recurrent low-grade urothelial carcinoma the bladder.  He completed a 6-week course of intravesical gemcitabine in 2022 however had not been seen for follow-up surveillance cystoscopy until earlier this month with findings of a recurrent papillary tumor at the bladder neck.  After reviewing the management options for treatment, he elected to proceed with the above surgical procedure(s). We have discussed the potential benefits and risks of the procedure, side effects of the proposed treatment, the likelihood of the patient achieving the goals of the procedure, and any potential problems that might occur during the procedure or recuperation. Informed consent has been obtained.  Description of procedure:  The patient was taken to the operating room and general anesthesia was induced.  The patient was placed in the dorsal lithotomy position, prepped and draped in the usual sterile fashion, and preoperative antibiotics were administered. A preoperative time-out was performed.   A 26 French continuous-flow resectoscope sheath was lubricated and passed per urethra.  An Iglesias resectoscope with loop was then placed in the sheath and panendoscopy was performed.  The urethra was normal in caliber without stricture.  The prostate demonstrated mild lateral lobe enlargement.  The bladder mucosa was inspected with findings as described above.  Mild bladder trabeculation present.  No other lesions were  identified.  UOs normal-appearing.  The tumor was resected in its entirety with a bipolar loop.  Deeper resection down the superficial muscle was then performed.  Hemostasis was then achieved with the loop cautery and the bladder was emptied and reinspected with no further bleeding noted at the end of the procedure.   All specimen was removed via irrigation and sent to pathology.  The bladder was then emptied and and an 25 French Foley catheter was placed.  The catheter was irrigated with return of clear effluent.  2000 mg of intravesical gemcitabine was instilled to the bladder.  This was allowed to dwell in the PACU for 1 hour. This was well-tolerated.  After 1 hour, the catheter was drained and removed.  After anesthetic reversal he was transported to the PACU in stable condition.  Plan: He will be notified with the pathology results and further follow-up will be scheduled at that time   Francis Kulik C. Lonna Cobb,  MD

## 2022-12-04 NOTE — Anesthesia Procedure Notes (Signed)
Procedure Name: Intubation Date/Time: 12/04/2022 10:47 AM  Performed by: Mohammed Kindle, CRNAPre-anesthesia Checklist: Patient identified, Emergency Drugs available, Suction available and Patient being monitored Patient Re-evaluated:Patient Re-evaluated prior to induction Oxygen Delivery Method: Circle system utilized Preoxygenation: Pre-oxygenation with 100% oxygen Induction Type: IV induction Ventilation: Mask ventilation without difficulty Laryngoscope Size: McGraph and 3 Grade View: Grade I Tube type: Oral Tube size: 7.5 mm Number of attempts: 1 Airway Equipment and Method: Stylet and Oral airway Placement Confirmation: ETT inserted through vocal cords under direct vision, positive ETCO2, breath sounds checked- equal and bilateral and CO2 detector Secured at: 21 cm Tube secured with: Tape Dental Injury: Teeth and Oropharynx as per pre-operative assessment

## 2022-12-04 NOTE — Transfer of Care (Signed)
Immediate Anesthesia Transfer of Care Note  Patient: Francis Jones  Procedure(s) Performed: TRANSURETHRAL RESECTION OF BLADDER TUMOR (TURBT) BLADDER INSTILLATION OF GEMCITABINE  Patient Location: PACU  Anesthesia Type:General  Level of Consciousness: awake, drowsy, and patient cooperative  Airway & Oxygen Therapy: Patient Spontanous Breathing and Patient connected to face mask oxygen  Post-op Assessment: Report given to RN and Post -op Vital signs reviewed and stable  Post vital signs: Reviewed and stable  Last Vitals:  Vitals Value Taken Time  BP 131/95 12/04/22 1145  Temp 36.4 C 12/04/22 1130  Pulse 85 12/04/22 1145  Resp 14 12/04/22 1145  SpO2 99 % 12/04/22 1145    Last Pain:  Vitals:   12/04/22 1130  TempSrc:   PainSc: Asleep      Patients Stated Pain Goal: 0 (12/04/22 0856)  Complications: No notable events documented.

## 2022-12-04 NOTE — Addendum Note (Signed)
Addendum  created 12/04/22 1433 by Mohammed Kindle, CRNA   Flowsheet accepted, Intraprocedure Flowsheets edited

## 2022-12-04 NOTE — Anesthesia Postprocedure Evaluation (Signed)
Anesthesia Post Note  Patient: Francis Jones  Procedure(s) Performed: TRANSURETHRAL RESECTION OF BLADDER TUMOR (TURBT) BLADDER INSTILLATION OF GEMCITABINE  Patient location during evaluation: PACU Anesthesia Type: General Level of consciousness: awake and alert Pain management: pain level controlled Vital Signs Assessment: post-procedure vital signs reviewed and stable Respiratory status: spontaneous breathing, nonlabored ventilation, respiratory function stable and patient connected to nasal cannula oxygen Cardiovascular status: blood pressure returned to baseline and stable Postop Assessment: no apparent nausea or vomiting Anesthetic complications: no  No notable events documented.   Last Vitals:  Vitals:   12/04/22 1215 12/04/22 1236  BP: (!) 134/92 (!) 127/92  Pulse:  86  Resp:  16  Temp:  36.6 C  SpO2: 96% 97%    Last Pain:  Vitals:   12/04/22 1236  TempSrc: Temporal  PainSc: 0-No pain                 Stephanie Coup

## 2022-12-04 NOTE — Discharge Instructions (Addendum)
Transurethral Resection of Bladder Tumor (TURBT) or Bladder Biopsy   Definition:  Transurethral Resection of the Bladder Tumor is a surgical procedure used to diagnose and remove tumors within the bladder.   General instructions:     Your recent bladder surgery requires very little post hospital care but some definite precautions.  Despite the fact that no skin incisions were used, the area around the bladder incisions are raw and covered with scabs to promote healing and prevent bleeding. Certain precautions are needed to insure that the scabs are not disturbed over the next 2-4 weeks while the healing proceeds.  Because the raw surface inside your bladder and the irritating effects of urine you may expect frequency of urination and/or urgency (a stronger desire to urinate) and perhaps even getting up at night more often. This will usually resolve or improve slowly over the healing period. You may see some blood in your urine over the first 6 weeks. Do not be alarmed, even if the urine was clear for a while. Get off your feet and drink lots of fluids until clearing occurs. If you start to pass clots or don't improve call us.  Diet:  You may return to your normal diet immediately. Because of the raw surface of your bladder, alcohol, spicy foods, foods high in acid and drinks with caffeine may cause irritation or frequency and should be used in moderation. To keep your urine flowing freely and avoid constipation, drink plenty of fluids during the day (8-10 glasses). Tip: Avoid cranberry juice because it is very acidic.  Activity:  Your physical activity doesn't need to be restricted. However, if you are very active, you may see some blood in the urine. We suggest that you reduce your activity under the circumstances until the bleeding has stopped.  Bowels:  It is important to keep your bowels regular during the postoperative period. Straining with bowel movements can cause bleeding. A bowel  movement every other day is reasonable. Use a mild laxative if needed, such as milk of magnesia 2-3 tablespoons, or 2 Dulcolax tablets. Call if you continue to have problems. If you had been taking narcotics for pain, before, during or after your surgery, you may be constipated. Take a laxative if necessary.    Medication:  You should resume your pre-surgery medications unless told not to.  A prescription for Pyridium to help with burning with urination was sent to your pharmacy if needed  Follow-up:  You will be contacted with your pathology results once they are received   [facility] [address] [place], [place] [address] [phone]  AMBULATORY SURGERY  DISCHARGE INSTRUCTIONS   The drugs that you were given will stay in your system until tomorrow so for the next 24 hours you should not:  Drive an automobile Make any legal decisions Drink any alcoholic beverage   You may resume regular meals tomorrow.  Today it is better to start with liquids and gradually work up to solid foods.  You may eat anything you prefer, but it is better to start with liquids, then soup and crackers, and gradually work up to solid foods.   Please notify your doctor immediately if you have any unusual bleeding, trouble breathing, redness and pain at the surgery site, drainage, fever, or pain not relieved by medication.    Additional Instructions:   Please contact your physician with any problems or Same Day Surgery at [phone], Monday through Friday 6 am to 4 pm, or [facility] number at [phone].

## 2022-12-04 NOTE — Anesthesia Preprocedure Evaluation (Signed)
Anesthesia Evaluation  Patient identified by MRN, date of birth, ID band Patient awake    Reviewed: Allergy & Precautions, NPO status , Patient's Chart, lab work & pertinent test results  History of Anesthesia Complications (+) PONV and history of anesthetic complications  Airway Mallampati: III  TM Distance: >3 FB Neck ROM: full    Dental no notable dental hx. (+) Chipped, Dental Advidsory Given   Pulmonary shortness of breath and with exertion, neg sleep apnea, neg COPD, former smoker   Pulmonary exam normal breath sounds clear to auscultation- rhonchi (-) wheezing      Cardiovascular Exercise Tolerance: Good (-) hypertension(-) CAD, (-) Past MI, (-) Cardiac Stents and (-) CABG + dysrhythmias Atrial Fibrillation  Rhythm:Regular Rate:Normal - Systolic murmurs and - Diastolic murmurs    Neuro/Psych neg Seizures negative neurological ROS  negative psych ROS   GI/Hepatic negative GI ROS, Neg liver ROS,,,  Endo/Other  negative endocrine ROSneg diabetes    Renal/GU      Musculoskeletal negative musculoskeletal ROS (+)    Abdominal  (+) + obese  Peds  Hematology negative hematology ROS (+)   Anesthesia Other Findings Past Medical History: No date: Allergic rhinitis No date: Aortic atherosclerosis (HCC) No date: Arthritis 12/16/2020: Atrial fibrillation (HCC)     Comment:  a.) CHA2DS2VASc = 2 (age, vascular disease history);                b.) rate/rhythm maintained on oral metoprolol succinate;               has refused DCCV and chronic oral anticoagulation No date: Coronary artery calcification seen on CT scan No date: Diverticulosis No date: Dyspnea No date: HLD (hyperlipidemia) No date: PONV (postoperative nausea and vomiting) 02/04/2018: Urothelial carcinoma of bladder (HCC)     Comment:  a.) s/p TURBT 02/04/2018: pathology (+) high grade               non-invasive urothelial carcinoma (no muscularis propria                invasion); b.) s/p TURBT 10/13/2019 --> pathology (+):               low grade non-invasive urothelial carcinoma (no               muscularis propria invasion); c.) s/p TURBT 12/27/2020:               pathology (+) low grade non-invasive urothelial carcinoma              (no muscularis propria invasion)  Past Surgical History: 1973: APPENDECTOMY 12/27/2020: CYSTOSCOPY WITH BIOPSY; N/A     Comment:  Procedure: CYSTOSCOPY WITH BLADDER BIOPSY;  Surgeon:               Riki Altes, MD;  Location: [facility];  Service:               Urology;  Laterality: N/A; 12/27/2020: CYSTOSCOPY WITH FULGERATION; N/A     Comment:  Procedure: CYSTOSCOPY WITH FULGERATION;  Surgeon:               Riki Altes, MD;  Location: [facility];  Service:               Urology;  Laterality: N/A; 02/04/2018: CYSTOSCOPY WITH STENT PLACEMENT; Right     Comment:  Procedure: CYSTOSCOPY;  Surgeon: Riki Altes, MD;  Location: [facility];  Service: Urology;  Laterality:               Right; 2001: FINGER SURGERY; Left     Comment:  titanium in left 5th finger 2000: HERNIA REPAIR 2009: SHOULDER SURGERY; Left 02/04/2018: TRANSURETHRAL RESECTION OF BLADDER TUMOR; N/A     Comment:  Procedure: TRANSURETHRAL RESECTION OF BLADDER TUMOR               (TURBT) with gemcitabine;  Surgeon: Riki Altes, MD;              Location: [facility];  Service: Urology;  Laterality: N/A; 10/13/2019: TRANSURETHRAL RESECTION OF BLADDER TUMOR; N/A     Comment:  Procedure: TRANSURETHRAL RESECTION OF BLADDER TUMOR               (TURBT) with gemcitabine;  Surgeon: Riki Altes, MD;              Location: [facility];  Service: Urology;  Laterality: N/A; 1987: VASECTOMY  BMI    Body Mass Index: 37.84 kg/m      Reproductive/Obstetrics negative OB ROS                             Anesthesia Physical Anesthesia Plan  ASA: 3  Anesthesia Plan: General ETT   Post-op Pain Management:     Induction: Intravenous  PONV Risk Score and Plan: Ondansetron, Dexamethasone, Propofol infusion and TIVA  Airway Management Planned: Oral ETT  Additional Equipment:   Intra-op Plan:   Post-operative Plan: Extubation in OR  Informed Consent: I have reviewed the patients History and Physical, chart, labs and discussed the procedure including the risks, benefits and alternatives for the proposed anesthesia with the patient or authorized representative who has indicated his/her understanding and acceptance.     Dental Advisory Given  Plan Discussed with: Anesthesiologist, CRNA and Surgeon  Anesthesia Plan Comments: (Patient consented for risks of anesthesia including but not limited to:  - adverse reactions to medications - damage to eyes, teeth, lips or other oral mucosa - nerve damage due to positioning  - sore throat or hoarseness - Damage to heart, brain, nerves, lungs, other parts of body or loss of life  Patient voiced understanding.)        Anesthesia Quick Evaluation

## 2022-12-05 ENCOUNTER — Encounter: Payer: Self-pay | Admitting: Urology

## 2022-12-05 DIAGNOSIS — Z08 Encounter for follow-up examination after completed treatment for malignant neoplasm: Secondary | ICD-10-CM | POA: Diagnosis not present

## 2022-12-05 DIAGNOSIS — L570 Actinic keratosis: Secondary | ICD-10-CM | POA: Diagnosis not present

## 2022-12-05 DIAGNOSIS — Z85828 Personal history of other malignant neoplasm of skin: Secondary | ICD-10-CM | POA: Diagnosis not present

## 2022-12-05 DIAGNOSIS — D2272 Melanocytic nevi of left lower limb, including hip: Secondary | ICD-10-CM | POA: Diagnosis not present

## 2022-12-05 DIAGNOSIS — D2271 Melanocytic nevi of right lower limb, including hip: Secondary | ICD-10-CM | POA: Diagnosis not present

## 2022-12-05 DIAGNOSIS — D225 Melanocytic nevi of trunk: Secondary | ICD-10-CM | POA: Diagnosis not present

## 2022-12-05 DIAGNOSIS — D2261 Melanocytic nevi of right upper limb, including shoulder: Secondary | ICD-10-CM | POA: Diagnosis not present

## 2022-12-05 DIAGNOSIS — L820 Inflamed seborrheic keratosis: Secondary | ICD-10-CM | POA: Diagnosis not present

## 2022-12-05 DIAGNOSIS — D485 Neoplasm of uncertain behavior of skin: Secondary | ICD-10-CM | POA: Diagnosis not present

## 2022-12-05 DIAGNOSIS — L821 Other seborrheic keratosis: Secondary | ICD-10-CM | POA: Diagnosis not present

## 2022-12-05 DIAGNOSIS — D2262 Melanocytic nevi of left upper limb, including shoulder: Secondary | ICD-10-CM | POA: Diagnosis not present

## 2022-12-05 DIAGNOSIS — X32XXXA Exposure to sunlight, initial encounter: Secondary | ICD-10-CM | POA: Diagnosis not present

## 2022-12-06 ENCOUNTER — Encounter: Payer: Self-pay | Admitting: Urology

## 2022-12-14 DIAGNOSIS — Z23 Encounter for immunization: Secondary | ICD-10-CM | POA: Diagnosis not present

## 2022-12-17 ENCOUNTER — Encounter: Payer: Self-pay | Admitting: Urology

## 2022-12-17 ENCOUNTER — Ambulatory Visit (INDEPENDENT_AMBULATORY_CARE_PROVIDER_SITE_OTHER): Payer: Medicare Other | Admitting: Urology

## 2022-12-17 VITALS — BP 117/81 | HR 74 | Ht 72.0 in | Wt 279.0 lb

## 2022-12-17 DIAGNOSIS — C680 Malignant neoplasm of urethra: Secondary | ICD-10-CM

## 2022-12-17 DIAGNOSIS — C679 Malignant neoplasm of bladder, unspecified: Secondary | ICD-10-CM | POA: Diagnosis not present

## 2022-12-17 NOTE — Progress Notes (Signed)
I, Francis Jones,acting as a scribe for Francis Altes, MD.,have documented all relevant documentation on the behalf of Francis Altes, MD,as directed by  Francis Altes, MD while in the presence of Francis Altes, MD.  12/17/2022 10:56 AM   Francis Jones 06/01/1951 [id number]  Referring provider: Bosie Clos, MD [address] [place],  [place] [address]  Chief Complaint  Patient presents with   Other   Urologic history: 1. Low grade Ta urethral carcinoma of the bladder TURBT August 2019 papillary bladder tumor High-grade urothelial carcinoma Ta Induction BCG completed November 2019 TURBT 10/13/2019 10 mm papillary lesion right bladder neck, received post resection gemcitabine Pathology Ta urothelial carcinoma (low-grade) TURBT 12/2020 for 5 mm recurrent tumor posterior wall Pathology Ta low-grade urothelial carcinoma Underwent 6-week course of intravesical gemcitabine and oncology TURBT for 1 cm recurrent bladder tumor 12/04/22  HPI: Francis Jones is a 72 y.o. male presents for post-op follow-up.   Status post TURBT for 1 cm recurrent bladder tumor 12/04/22. Received post-resection intravesical gemcitabine. Pathology: low grade non-invasive urothelial carcinoma of the bladder. Had an episode of gross hematuria with clots yesterday, which cleared rapidly. Post-operative irritated symptoms are improving.   PMH: Past Medical History:  Diagnosis Date   Allergic rhinitis    Aortic atherosclerosis (HCC)    Arthritis    Atrial fibrillation (HCC) 12/16/2020   a.) CHA2DS2VASc = 2 (age, vascular disease history);  b.) rate/rhythm maintained on oral metoprolol succinate; has refused DCCV and chronic oral anticoagulation   Coronary artery calcification seen on CT scan    Diverticulosis    Dyspnea    HLD (hyperlipidemia)    PONV (postoperative nausea and vomiting)    Urothelial carcinoma of bladder (HCC) 02/04/2018   a.) s/p TURBT 02/04/2018: pathology (+) high  grade non-invasive urothelial carcinoma (no muscularis propria invasion); b.) s/p TURBT 10/13/2019 --> pathology (+): low grade non-invasive urothelial carcinoma (no muscularis propria invasion); c.) s/p TURBT 12/27/2020: pathology (+) low grade non-invasive urothelial carcinoma (no muscularis propria invasion)    Surgical History: Past Surgical History:  Procedure Laterality Date   APPENDECTOMY  1973   BLADDER INSTILLATION N/A 12/04/2022   Procedure: BLADDER INSTILLATION OF GEMCITABINE;  Surgeon: Francis Altes, MD;  Location: [facility];  Service: Urology;  Laterality: N/A;   CYSTOSCOPY WITH BIOPSY N/A 12/27/2020   Procedure: CYSTOSCOPY WITH BLADDER BIOPSY;  Surgeon: Francis Altes, MD;  Location: [facility];  Service: Urology;  Laterality: N/A;   CYSTOSCOPY WITH FULGERATION N/A 12/27/2020   Procedure: CYSTOSCOPY WITH FULGERATION;  Surgeon: Francis Altes, MD;  Location: [facility];  Service: Urology;  Laterality: N/A;   CYSTOSCOPY WITH STENT PLACEMENT Right 02/04/2018   Procedure: CYSTOSCOPY;  Surgeon: Francis Altes, MD;  Location: [facility];  Service: Urology;  Laterality: Right;   FINGER SURGERY Left 2001   titanium in left 5th finger   HERNIA REPAIR  2000   SHOULDER SURGERY Left 2009   TRANSURETHRAL RESECTION OF BLADDER TUMOR N/A 02/04/2018   Procedure: TRANSURETHRAL RESECTION OF BLADDER TUMOR (TURBT) with gemcitabine;  Surgeon: Francis Altes, MD;  Location: [facility];  Service: Urology;  Laterality: N/A;   TRANSURETHRAL RESECTION OF BLADDER TUMOR N/A 10/13/2019   Procedure: TRANSURETHRAL RESECTION OF BLADDER TUMOR (TURBT) with gemcitabine;  Surgeon: Francis Altes, MD;  Location: [facility];  Service: Urology;  Laterality: N/A;   TRANSURETHRAL RESECTION OF BLADDER TUMOR N/A 12/04/2022   Procedure: TRANSURETHRAL RESECTION OF BLADDER TUMOR (  TURBT);  Surgeon: Francis Altes, MD;  Location: [facility];  Service: Urology;  Laterality: N/A;   VASECTOMY  1987    Home Medications:  Allergies  as of 12/17/2022       Reactions   Hydrocodone-acetaminophen Nausea And Vomiting   Oxycodone Nausea And Vomiting        Medication List        Accurate as of December 17, 2022 10:56 AM. If you have any questions, ask your nurse or doctor.          acetaminophen 650 MG CR tablet Commonly known as: TYLENOL Take 650-1,300 mg by mouth every 8 (eight) hours as needed for pain.   aspirin EC 81 MG tablet Take 1 tablet (81 mg total) by mouth daily. Swallow whole.   cholecalciferol 25 MCG (1000 UNIT) tablet Commonly known as: VITAMIN D3 Take 1,000 Units by mouth daily.   Fish Oil 1200 MG Caps Take 2,400 mg by mouth daily.   ibuprofen 200 MG tablet Commonly known as: ADVIL Take 200-400 mg by mouth daily as needed for moderate pain.   loratadine 10 MG tablet Commonly known as: CLARITIN Take 10 mg by mouth daily.   metoprolol succinate 25 MG 24 hr tablet Commonly known as: Toprol XL Take 1 tablet (25 mg total) by mouth daily. PLEASE CALL [phone] TO SCHEDULE YEARLY FOLLOW UP AND FOR FURTHER REFILLS. THANK YOU.   MULTIPLE VITAMIN PO Take 1 tablet by mouth daily.   phenazopyridine 200 MG tablet Commonly known as: PYRIDIUM Take 1 tablet (200 mg total) by mouth 3 (three) times daily as needed (burning with urination).   REFRESH OPTIVE OP Place 1 drop into both eyes daily as needed (dryness).   Super B Complex/C Caps Take 1 capsule by mouth daily.        Allergies:  Allergies  Allergen Reactions   Hydrocodone-Acetaminophen Nausea And Vomiting   Oxycodone Nausea And Vomiting    Family History: Family History  Problem Relation Age of Onset   Alzheimer's disease Father     Social History:  reports that he quit smoking about 45 years ago. His smoking use included cigarettes. He has never used smokeless tobacco. He reports current alcohol use of about 2.0 standard drinks of alcohol per week. He reports that he does not currently use drugs after having used the  following drugs: LSD and Opium.   Physical Exam: BP 117/81   Pulse 74   Ht 6' (1.829 m)   Wt 279 lb (126.6 kg)   BMI 37.84 kg/m   Constitutional:  Alert and oriented, No acute distress. HEENT: [place] AT Respiratory: Normal respiratory effort, no increased work of breathing. Psychiatric: Normal mood and affect.   Assessment & Plan:    1. Low grade Ta urethral carcinoma of the bladder 2 years since his last recurrence and he received post-resection intravesical gemcitabine. Tumor was approximately 1 cm Would not recommend any additional intravascular therapy at this time.  3 month follow-up cystoscopy  I have reviewed the above documentation for accuracy and completeness, and I agree with the above.   Francis Altes, MD  [facility] [address], [address] [place], [place] [address] [phone]

## 2022-12-18 ENCOUNTER — Other Ambulatory Visit: Payer: Self-pay

## 2023-01-10 ENCOUNTER — Other Ambulatory Visit: Payer: Self-pay

## 2023-01-10 DIAGNOSIS — I4891 Unspecified atrial fibrillation: Secondary | ICD-10-CM

## 2023-01-10 MED ORDER — METOPROLOL SUCCINATE ER 25 MG PO TB24
25.0000 mg | ORAL_TABLET | Freq: Every day | ORAL | 1 refills | Status: DC
Start: 2023-01-10 — End: 2023-02-08

## 2023-02-06 ENCOUNTER — Encounter: Payer: Self-pay | Admitting: Urology

## 2023-02-08 ENCOUNTER — Other Ambulatory Visit: Payer: Self-pay | Admitting: *Deleted

## 2023-02-08 DIAGNOSIS — I4891 Unspecified atrial fibrillation: Secondary | ICD-10-CM

## 2023-02-08 MED ORDER — METOPROLOL SUCCINATE ER 25 MG PO TB24: 25.0000 mg | ORAL_TABLET | Freq: Every day | ORAL | 0 refills | Status: DC

## 2023-02-13 NOTE — Progress Notes (Signed)
02/14/2023 8:33 AM   Francis Jones 04-05-1951 [id number]  Referring provider: Bosie Clos, MD [address] [place],  [place] [address]  Urological history: 1. 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 Lonna Cobb  2. BPH with LU TS -PSA *** -cysto (11/2022) -mild lateral lobe enlargement and moderate trabeculation  No chief complaint on file.  HPI: Francis Jones is a 72 y.o. male who presents today for urinary urgency.   Previous records reviewed.   UA ***  PVR ***  PMH: Past Medical History:  Diagnosis Date   Allergic rhinitis    Aortic atherosclerosis (HCC)    Arthritis    Atrial fibrillation (HCC) 12/16/2020   a.) CHA2DS2VASc = 2 (age, vascular disease history);  b.) rate/rhythm maintained on oral metoprolol succinate; has refused DCCV and chronic oral anticoagulation   Coronary artery calcification seen on CT scan    Diverticulosis    Dyspnea    HLD (hyperlipidemia)    PONV (postoperative nausea and vomiting)    Urothelial carcinoma of bladder (HCC) 02/04/2018   a.) s/p TURBT 02/04/2018: pathology (+) high grade non-invasive urothelial carcinoma (no muscularis propria invasion); b.) s/p TURBT 10/13/2019 --> pathology (+): low grade non-invasive urothelial carcinoma (no muscularis propria invasion); c.) s/p TURBT 12/27/2020: pathology (+) low grade non-invasive urothelial carcinoma (no muscularis propria invasion)     Surgical History: Past Surgical History:  Procedure Laterality Date   APPENDECTOMY  1973   BLADDER INSTILLATION N/A 12/04/2022   Procedure: BLADDER INSTILLATION OF GEMCITABINE;  Surgeon: Riki Altes, MD;  Location: [facility];  Service: Urology;  Laterality: N/A;   CYSTOSCOPY WITH BIOPSY N/A 12/27/2020   Procedure: CYSTOSCOPY WITH BLADDER BIOPSY;  Surgeon: Riki Altes, MD;  Location: [facility];  Service: Urology;  Laterality: N/A;   CYSTOSCOPY WITH FULGERATION N/A 12/27/2020   Procedure: CYSTOSCOPY WITH FULGERATION;  Surgeon: Riki Altes, MD;  Location: [facility];  Service: Urology;  Laterality: N/A;   CYSTOSCOPY WITH STENT PLACEMENT Right 02/04/2018   Procedure: CYSTOSCOPY;  Surgeon: Riki Altes, MD;  Location: [facility];  Service: Urology;  Laterality: Right;   FINGER SURGERY Left 2001   titanium in left 5th finger   HERNIA REPAIR  2000   SHOULDER SURGERY Left 2009   TRANSURETHRAL RESECTION OF BLADDER TUMOR N/A 02/04/2018   Procedure: TRANSURETHRAL RESECTION OF BLADDER TUMOR (TURBT) with gemcitabine;  Surgeon: Riki Altes, MD;  Location: [facility];  Service: Urology;  Laterality: N/A;   TRANSURETHRAL RESECTION OF BLADDER TUMOR N/A 10/13/2019   Procedure: TRANSURETHRAL RESECTION OF BLADDER TUMOR (TURBT) with gemcitabine;  Surgeon: Riki Altes, MD;  Location: [facility];  Service: Urology;  Laterality: N/A;   TRANSURETHRAL RESECTION OF BLADDER TUMOR N/A 12/04/2022   Procedure: TRANSURETHRAL RESECTION OF BLADDER TUMOR (TURBT);  Surgeon: Riki Altes, MD;  Location: [facility];  Service: Urology;  Laterality: N/A;   VASECTOMY  1987    Home Medications:  Allergies as of 02/14/2023       Reactions   Hydrocodone-acetaminophen Nausea And Vomiting   Oxycodone  Nausea And Vomiting        Medication List        Accurate as of February 13, 2023  8:33 AM. If you have any questions, ask your nurse or doctor.          acetaminophen 650 MG CR tablet Commonly known  as: TYLENOL Take 650-1,300 mg by mouth every 8 (eight) hours as needed for pain.   aspirin EC 81 MG tablet Take 1 tablet (81 mg total) by mouth daily. Swallow whole.   cholecalciferol 25 MCG (1000 UNIT) tablet Commonly known as: VITAMIN D3 Take 1,000 Units by mouth daily.   Fish Oil 1200 MG Caps Take 2,400 mg by mouth daily.   ibuprofen 200 MG tablet Commonly known as: ADVIL Take 200-400 mg by mouth daily as needed for moderate pain.   loratadine 10 MG tablet Commonly known as: CLARITIN Take 10 mg by mouth daily.   metoprolol succinate 25 MG 24 hr tablet Commonly known as: Toprol XL Take 1 tablet (25 mg total) by mouth daily.   MULTIPLE VITAMIN PO Take 1 tablet by mouth daily.   phenazopyridine 200 MG tablet Commonly known as: PYRIDIUM Take 1 tablet (200 mg total) by mouth 3 (three) times daily as needed (burning with urination).   REFRESH OPTIVE OP Place 1 drop into both eyes daily as needed (dryness).   Super B Complex/C Caps Take 1 capsule by mouth daily.        Allergies:  Allergies  Allergen Reactions   Hydrocodone-Acetaminophen Nausea And Vomiting   Oxycodone Nausea And Vomiting    Family History: Family History  Problem Relation Age of Onset   Alzheimer's disease Father     Social History:  reports that he quit smoking about 45 years ago. His smoking use included cigarettes. He has never used smokeless tobacco. He reports current alcohol use of about 2.0 standard drinks of alcohol per week. He reports that he does not currently use drugs after having used the following drugs: LSD and Opium.  ROS: Pertinent ROS in HPI  Physical Exam: There were no vitals taken for this visit.  Constitutional:  Well nourished. Alert and oriented, No acute distress. HEENT: [place] AT, moist mucus membranes.  Trachea midline, no masses. Cardiovascular: No clubbing, cyanosis, or edema. Respiratory: Normal respiratory effort, no increased work of breathing. GI: Abdomen is  soft, non tender, non distended, no abdominal masses. Liver and spleen not palpable.  No hernias appreciated.  Stool sample for occult testing is not indicated.   GU: No CVA tenderness.  No bladder fullness or masses.  Patient with circumcised/uncircumcised phallus. ***Foreskin easily retracted***  Urethral meatus is patent.  No penile discharge. No penile lesions or rashes. Scrotum without lesions, cysts, rashes and/or edema.  Testicles are located scrotally bilaterally. No masses are appreciated in the testicles. Left and right epididymis are normal. Rectal: Patient with  normal sphincter tone. Anus and perineum without scarring or rashes. No rectal masses are appreciated. Prostate is approximately *** grams, *** nodules are appreciated. Seminal vesicles are normal. Skin: No rashes, bruises or suspicious lesions. Lymph: No cervical or inguinal adenopathy. Neurologic: Grossly intact, no focal deficits, moving all 4 extremities. Psychiatric: Normal mood and affect.  Laboratory Data: Lab Results  Component Value Date   WBC 8.0 11/29/2022   HGB 17.3 (H) 11/29/2022   HCT 51.6 11/29/2022   MCV 88.8 11/29/2022   PLT 234 11/29/2022    Lab Results  Component Value Date   CREATININE 0.75 11/29/2022  Lab Results  Component Value Date   AST 23 02/06/2021   Lab Results  Component Value Date   ALT 22 02/06/2021   Urinalysis I have reviewed the labs.  SEE HPI.     Pertinent Imaging: ***  Assessment & Plan:  ***  1. Urgency -UA *** -urine sent for culture -***   No follow-ups on file.  These notes generated with voice recognition software. I apologize for typographical errors.  Cloretta Ned  [facility] [address]  [address] [place], [place] [address] [phone]

## 2023-02-14 ENCOUNTER — Encounter: Payer: Self-pay | Admitting: Urology

## 2023-02-14 ENCOUNTER — Ambulatory Visit (INDEPENDENT_AMBULATORY_CARE_PROVIDER_SITE_OTHER): Payer: Medicare Other | Admitting: Urology

## 2023-02-14 VITALS — BP 125/84 | HR 105 | Ht 72.0 in | Wt 280.0 lb

## 2023-02-14 DIAGNOSIS — C679 Malignant neoplasm of bladder, unspecified: Secondary | ICD-10-CM

## 2023-02-14 DIAGNOSIS — R3915 Urgency of urination: Secondary | ICD-10-CM | POA: Diagnosis not present

## 2023-02-14 LAB — BLADDER SCAN AMB NON-IMAGING

## 2023-02-14 LAB — MICROSCOPIC EXAMINATION: Epithelial Cells (non renal): >10 /HPF — AB (ref 0–10)

## 2023-02-14 LAB — URINALYSIS, COMPLETE
Bilirubin, UA: NEGATIVE
Glucose, UA: NEGATIVE
Leukocytes,UA: NEGATIVE
Nitrite, UA: NEGATIVE
Protein,UA: NEGATIVE
Specific Gravity, UA: 1.025 (ref 1.005–1.030)
Urobilinogen, Ur: 2 mg/dL — ABNORMAL HIGH (ref 0.2–1.0)
pH, UA: 5.5 (ref 5.0–7.5)

## 2023-02-15 NOTE — Telephone Encounter (Signed)
LMOV  

## 2023-02-19 LAB — CULTURE, URINE COMPREHENSIVE

## 2023-03-04 DIAGNOSIS — Z23 Encounter for immunization: Secondary | ICD-10-CM | POA: Diagnosis not present

## 2023-03-20 ENCOUNTER — Encounter: Payer: Self-pay | Admitting: Urology

## 2023-03-20 ENCOUNTER — Ambulatory Visit (INDEPENDENT_AMBULATORY_CARE_PROVIDER_SITE_OTHER): Payer: Medicare Other | Admitting: Urology

## 2023-03-20 VITALS — BP 125/89 | HR 90 | Ht 72.0 in | Wt 283.0 lb

## 2023-03-20 DIAGNOSIS — Z8551 Personal history of malignant neoplasm of bladder: Secondary | ICD-10-CM | POA: Diagnosis not present

## 2023-03-20 DIAGNOSIS — R3915 Urgency of urination: Secondary | ICD-10-CM | POA: Diagnosis not present

## 2023-03-20 LAB — URINALYSIS, COMPLETE
Bilirubin, UA: NEGATIVE
Glucose, UA: NEGATIVE
Ketones, UA: NEGATIVE
Leukocytes,UA: NEGATIVE
Nitrite, UA: NEGATIVE
Protein,UA: NEGATIVE
Specific Gravity, UA: 1.025 (ref 1.005–1.030)
Urobilinogen, Ur: 2 mg/dL — ABNORMAL HIGH (ref 0.2–1.0)
pH, UA: 6 (ref 5.0–7.5)

## 2023-03-20 LAB — MICROSCOPIC EXAMINATION

## 2023-03-20 NOTE — Progress Notes (Signed)
   03/20/23  Urologic history 1. 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 male presents for follow-up surveillance cystoscopy.  No complaints.  Denies gross hematuria.  UA today microscopy negative  Blood pressure 120/82, pulse 92, height 6' (1.829 m), weight 279 lb (126.6 kg). NED. A&Ox3.   No respiratory distress   Abd soft, NT, ND Normal phallus with bilateral descended testicles  Cystoscopy Procedure Note  Patient identification was confirmed, informed consent was obtained, and patient was prepped using Betadine solution.  Lidocaine jelly was administered per urethral meatus.     Pre-Procedure: - Inspection reveals a normal caliber urethral meatus.  Procedure: The flexible cystoscope was introduced without difficulty - No urethral strictures/lesions are present. -  Moderate lateral lobe enlargement  prostate  - Normal bladder neck - Bilateral ureteral orifices identified - Bladder mucosa  reveals no erythema, solid or papillary lesions - No bladder stones - Moderate trabeculation  Retroflexion shows no abnormalities   Post-Procedure: - Patient tolerated the procedure well  Assessment/ Plan: No evidence of recurrent bladder tumor 6 month follow-up surveillance cystoscopy   Riki Altes, MD

## 2023-03-21 ENCOUNTER — Other Ambulatory Visit: Payer: Self-pay

## 2023-05-02 ENCOUNTER — Other Ambulatory Visit: Payer: Self-pay

## 2023-05-02 DIAGNOSIS — I4891 Unspecified atrial fibrillation: Secondary | ICD-10-CM

## 2023-05-02 MED ORDER — METOPROLOL SUCCINATE ER 25 MG PO TB24
25.0000 mg | ORAL_TABLET | Freq: Every day | ORAL | 0 refills | Status: DC
Start: 2023-05-02 — End: 2023-05-06

## 2023-05-06 ENCOUNTER — Other Ambulatory Visit: Payer: Self-pay

## 2023-05-06 DIAGNOSIS — I4891 Unspecified atrial fibrillation: Secondary | ICD-10-CM

## 2023-05-06 MED ORDER — METOPROLOL SUCCINATE ER 25 MG PO TB24: 25.0000 mg | ORAL_TABLET | Freq: Every day | ORAL | 0 refills | Status: DC

## 2023-05-06 NOTE — Telephone Encounter (Signed)
Requested Prescriptions   Signed Prescriptions Disp Refills   metoprolol succinate (TOPROL XL) 25 MG 24 hr tablet 15 tablet 0    Sig: Take 1 tablet (25 mg total) by mouth daily. PLEASE CALL [phone] TO SCHEDULE YEARLY APPOINTMENT AND TO RECEIVE FURTHER REFILLS. THANK YOU (3rd ATTEMPT)    Authorizing Provider: Debbe Odea    Ordering User: Guerry Minors

## 2023-05-06 NOTE — Telephone Encounter (Signed)
last visit 01/26/22 with plan to f/u in 12 months.  Next visit: none/active recall  Plase schedule f/u appt.  Thanks!

## 2023-05-20 DIAGNOSIS — L821 Other seborrheic keratosis: Secondary | ICD-10-CM | POA: Diagnosis not present

## 2023-05-20 DIAGNOSIS — H2513 Age-related nuclear cataract, bilateral: Secondary | ICD-10-CM | POA: Diagnosis not present

## 2023-05-20 DIAGNOSIS — H0259 Other disorders affecting eyelid function: Secondary | ICD-10-CM | POA: Diagnosis not present

## 2023-05-27 NOTE — Telephone Encounter (Signed)
Scheduled 07/15/23

## 2023-07-12 ENCOUNTER — Other Ambulatory Visit: Payer: Self-pay

## 2023-07-12 ENCOUNTER — Other Ambulatory Visit: Payer: Self-pay | Admitting: Cardiology

## 2023-07-12 DIAGNOSIS — I4891 Unspecified atrial fibrillation: Secondary | ICD-10-CM

## 2023-07-12 MED ORDER — METOPROLOL SUCCINATE ER 25 MG PO TB24
25.0000 mg | ORAL_TABLET | Freq: Every day | ORAL | 0 refills | Status: DC
Start: 2023-07-12 — End: 2023-07-22

## 2023-07-12 NOTE — Telephone Encounter (Signed)
Last office visit: 01/26/22 with plan to f/u 12 months. next office visit: 07/16/23  Requested Prescriptions   Signed Prescriptions Disp Refills   metoprolol succinate (TOPROL XL) 25 MG 24 hr tablet 15 tablet 0    Sig: Take 1 tablet (25 mg total) by mouth daily. PLEASE ATTEND SCHEDULED FEBRUARY APPOINTMENT FOR FURTHER REFILLS.    Authorizing Provider: Debbe Odea    Ordering User: Guerry Minors

## 2023-07-16 ENCOUNTER — Encounter: Payer: Self-pay | Admitting: Cardiology

## 2023-07-16 ENCOUNTER — Ambulatory Visit: Payer: Medicare Other | Attending: Cardiology | Admitting: Cardiology

## 2023-07-16 VITALS — BP 126/90 | HR 85 | Ht 72.0 in | Wt 275.0 lb

## 2023-07-16 DIAGNOSIS — Z6837 Body mass index (BMI) 37.0-37.9, adult: Secondary | ICD-10-CM | POA: Diagnosis not present

## 2023-07-16 DIAGNOSIS — I4821 Permanent atrial fibrillation: Secondary | ICD-10-CM | POA: Insufficient documentation

## 2023-07-16 NOTE — Progress Notes (Signed)
 Cardiology Office Note:    Date:  07/16/2023   ID:  Francis Jones, DOB [birth year], MRN [id number]  PCP:  Bertrum Charlie LITTIE, MD   [facility] Providers Cardiologist:  Redell Cave, MD Electrophysiologist:  OLE ONEIDA HOLTS, MD     Referring MD: Bertrum Charlie LITTIE, MD   Chief Complaint  Patient presents with   Follow-up    Patient denies new or acute cardiac problems/concerns today.      History of Present Illness:    Francis Jones is a 73 y.o. male with a hx of persistent atrial fibrillation, bladder cancer who presents for follow-up.    Denies palpitations, dizziness, shortness of breath.  Wife has been diagnosed with endometrial cancer with mets to the lungs.  Patient has to help wife with medications which has caused him to miss his metoprolol  occasionally.  He otherwise states doing okay.   Prior notes Echo 01/2021 EF 55 to 60% Lexiscan  Myoview  03/2021, low risk study, no ischemia. Declined anticoagulation and cardioversion.   Past Medical History:  Diagnosis Date   Allergic rhinitis    Aortic atherosclerosis (HCC)    Arthritis    Atrial fibrillation (HCC) 12/16/2020   a.) CHA2DS2VASc = 2 (age, vascular disease history);  b.) rate/rhythm maintained on oral metoprolol  succinate; has refused DCCV and chronic oral anticoagulation   Coronary artery calcification seen on CT scan    Diverticulosis    Dyspnea    HLD (hyperlipidemia)    PONV (postoperative nausea and vomiting)    Urothelial carcinoma of bladder (HCC) 02/04/2018   a.) s/p TURBT 02/04/2018: pathology (+) high grade non-invasive urothelial carcinoma (no muscularis propria invasion); b.) s/p TURBT 10/13/2019 --> pathology (+): low grade non-invasive urothelial carcinoma (no muscularis propria invasion); c.) s/p TURBT 12/27/2020: pathology (+) low grade non-invasive urothelial carcinoma (no muscularis propria invasion)    Past Surgical History:  Procedure Laterality Date   APPENDECTOMY  1973    BLADDER INSTILLATION N/A 12/04/2022   Procedure: BLADDER INSTILLATION OF GEMCITABINE ;  Surgeon: Twylla Glendia BROCKS, MD;  Location: [facility];  Service: Urology;  Laterality: N/A;   CYSTOSCOPY WITH BIOPSY N/A 12/27/2020   Procedure: CYSTOSCOPY WITH BLADDER BIOPSY;  Surgeon: Twylla Glendia BROCKS, MD;  Location: [facility];  Service: Urology;  Laterality: N/A;   CYSTOSCOPY WITH FULGERATION N/A 12/27/2020   Procedure: CYSTOSCOPY WITH FULGERATION;  Surgeon: Twylla Glendia BROCKS, MD;  Location: [facility];  Service: Urology;  Laterality: N/A;   CYSTOSCOPY WITH STENT PLACEMENT Right 02/04/2018   Procedure: CYSTOSCOPY;  Surgeon: Twylla Glendia BROCKS, MD;  Location: [facility];  Service: Urology;  Laterality: Right;   FINGER SURGERY Left 2001   titanium in left 5th finger   HERNIA REPAIR  2000   SHOULDER SURGERY Left 2009   TRANSURETHRAL RESECTION OF BLADDER TUMOR N/A 02/04/2018   Procedure: TRANSURETHRAL RESECTION OF BLADDER TUMOR (TURBT) with gemcitabine ;  Surgeon: Twylla Glendia BROCKS, MD;  Location: [facility];  Service: Urology;  Laterality: N/A;   TRANSURETHRAL RESECTION OF BLADDER TUMOR N/A 10/13/2019   Procedure: TRANSURETHRAL RESECTION OF BLADDER TUMOR (TURBT) with gemcitabine ;  Surgeon: Twylla Glendia BROCKS, MD;  Location: [facility];  Service: Urology;  Laterality: N/A;   TRANSURETHRAL RESECTION OF BLADDER TUMOR N/A 12/04/2022   Procedure: TRANSURETHRAL RESECTION OF BLADDER TUMOR (TURBT);  Surgeon: Twylla Glendia BROCKS, MD;  Location: [facility];  Service: Urology;  Laterality: N/A;   VASECTOMY  1987    Current Medications: Current Meds  Medication Sig   metoprolol  succinate (TOPROL  XL)  25 MG 24 hr tablet Take 1 tablet (25 mg total) by mouth daily. PLEASE ATTEND SCHEDULED FEBRUARY APPOINTMENT FOR FURTHER REFILLS.     Allergies:   Hydrocodone-acetaminophen  and Oxycodone    Social History   Socioeconomic History   Marital status: Married    Spouse name: Francis Jones   Number of children: Not on file   Years of education: Not on file    Highest education level: Not on file  Occupational History   Occupation: retired Teacher, english as a foreign language  Tobacco Use   Smoking status: Former    Current packs/day: 0.00    Types: Cigarettes    Quit date: 1979    Years since quitting: 46.1   Smokeless tobacco: Never  Vaping Use   Vaping status: Never Used  Substance and Sexual Activity   Alcohol use: Yes    Alcohol/week: 2.0 standard drinks of alcohol    Types: 1 Glasses of wine, 1 Cans of beer per week    Comment: occasional   Drug use: Not Currently    Types: LSD, Opium    Comment: as a teenager   Sexual activity: Yes    Birth control/protection: None  Other Topics Concern   Not on file  Social History Narrative   Pt lives with wife; retd- Stage Manager- nurse, [employer]. 5 years smoker- remote.    Social Drivers of Corporate Investment Banker Strain: Not on file  Food Insecurity: Not on file  Transportation Needs: Not on file  Physical Activity: Not on file  Stress: Not on file  Social Connections: Not on file     Family History: The patient's family history includes Alzheimer's disease in his father.  ROS:   Please see the history of present illness.     All other systems reviewed and are negative.  EKGs/Labs/Other Studies Reviewed:    The following studies were reviewed today:  EKG Interpretation Date/Time:  Tuesday July 16 2023 10:03:07 EST Ventricular Rate:  85 PR Interval:    QRS Duration:  86 QT Interval:  348 QTC Calculation: 414 R Axis:   35  Text Interpretation: Atrial fibrillation Confirmed by Darliss Rogue ([id number]) on 07/16/2023 10:08:21 AM    Recent Labs: 11/29/2022: BUN 11; Creatinine, Ser 0.75; Hemoglobin 17.3; Platelets 234; Potassium 3.9; Sodium 138  Recent Lipid Panel No results found for: CHOL, TRIG, HDL, CHOLHDL, VLDL, LDLCALC, LDLDIRECT   Risk Assessment/Calculations:          Physical Exam:    VS:  BP (!) 126/90 (BP Location: Left Arm, Patient Position: Sitting,  Cuff Size: Large)   Pulse 85   Ht 6' (1.829 m)   Wt 275 lb (124.7 kg)   SpO2 92%   BMI 37.30 kg/m     Wt Readings from Last 3 Encounters:  07/16/23 275 lb (124.7 kg)  03/20/23 283 lb (128.4 kg)  02/14/23 280 lb (127 kg)     GEN:  Well nourished, well developed in no acute distress HEENT: Normal NECK: No JVD; No carotid bruits CARDIAC: Irregular irregular, no murmurs, rubs, gallops RESPIRATORY:  Clear to auscultation without rales, wheezing or rhonchi  ABDOMEN: Soft, non-tender, non-distended MUSCULOSKELETAL:  No edema; No deformity  SKIN: Warm and dry NEUROLOGIC:  Alert and oriented x 3 PSYCHIATRIC:  Normal affect   ASSESSMENT:    1. Permanent atrial fibrillation (HCC)   2. BMI 37.0-37.9, adult    PLAN:    In order of problems listed above:  Permanent atrial fibrillation, heart rate 85.  Denies palpitations.  CHA2DS2-VASc of 1 (age).  EF 55 to 60%.  Normal LA size.  Continue Toprol -XL 25 mg daily, aspirin  81 mg.  Previously declined anticoagulation.  Aspirin  okay for now due to CHA2DS2-VASc score of 1.  Will strongly recommend anticoagulation after patient turns 74. Obesity, working on eating healthier, exercising and weight loss.  Follow-up in 12 months.   Medication Adjustments/Labs and Tests Ordered: Current medicines are reviewed at length with the patient today.  Concerns regarding medicines are outlined above.  Orders Placed This Encounter  Procedures   EKG 12-Lead     No orders of the defined types were placed in this encounter.     Patient Instructions  Medication Instructions:   Take aspirin  - Take one tablet ( 81mg ) by mouth daily.   *If you need a refill on your cardiac medications before your next appointment, please call your pharmacy*   Lab Work:  None Ordered  If you have labs (blood work) drawn today and your tests are completely normal, you will receive your results only by: MyChart Message (if you have MyChart) OR A paper copy in  the mail If you have any lab test that is abnormal or we need to change your treatment, we will call you to review the results.   Testing/Procedures:  None Ordered   Follow-Up: At [facility], you and your health needs are our priority.  As part of our continuing mission to provide you with exceptional heart care, we have created designated Provider Care Teams.  These Care Teams include your primary Cardiologist (physician) and Advanced Practice Providers (APPs -  Physician Assistants and Nurse Practitioners) who all work together to provide you with the care you need, when you need it.  We recommend signing up for the patient portal called MyChart.  Sign up information is provided on this After Visit Summary.  MyChart is used to connect with patients for Virtual Visits (Telemedicine).  Patients are able to view lab/test results, encounter notes, upcoming appointments, etc.  Non-urgent messages can be sent to your provider as well.   To learn more about what you can do with MyChart, go to [email or web address].    Your next appointment:   12 month(s)  Provider:   You may see Redell Cave, MD or one of the following Advanced Practice Providers on your designated Care Team:   Lonni Meager, NP Bernardino Bring, PA-C Cadence Franchester, PA-C Tylene Lunch, NP Barnie Hila, NP    Signed, Redell Cave, MD  07/16/2023 10:47 AM    [facility]

## 2023-07-16 NOTE — Patient Instructions (Signed)
 Medication Instructions:   Take aspirin  - Take one tablet ( 81mg ) by mouth daily.   *If you need a refill on your cardiac medications before your next appointment, please call your pharmacy*   Lab Work:  None Ordered  If you have labs (blood work) drawn today and your tests are completely normal, you will receive your results only by: MyChart Message (if you have MyChart) OR A paper copy in the mail If you have any lab test that is abnormal or we need to change your treatment, we will call you to review the results.   Testing/Procedures:  None Ordered   Follow-Up: At [facility], you and your health needs are our priority.  As part of our continuing mission to provide you with exceptional heart care, we have created designated Provider Care Teams.  These Care Teams include your primary Cardiologist (physician) and Advanced Practice Providers (APPs -  Physician Assistants and Nurse Practitioners) who all work together to provide you with the care you need, when you need it.  We recommend signing up for the patient portal called MyChart.  Sign up information is provided on this After Visit Summary.  MyChart is used to connect with patients for Virtual Visits (Telemedicine).  Patients are able to view lab/test results, encounter notes, upcoming appointments, etc.  Non-urgent messages can be sent to your provider as well.   To learn more about what you can do with MyChart, go to [email or web address].    Your next appointment:   12 month(s)  Provider:   You may see Redell Cave, MD or one of the following Advanced Practice Providers on your designated Care Team:   Lonni Meager, NP Bernardino Bring, PA-C Cadence Franchester, PA-C Tylene Lunch, NP Barnie Hila, NP

## 2023-07-17 ENCOUNTER — Encounter: Payer: Self-pay | Admitting: Cardiology

## 2023-07-22 ENCOUNTER — Other Ambulatory Visit: Payer: Self-pay

## 2023-07-22 DIAGNOSIS — I4891 Unspecified atrial fibrillation: Secondary | ICD-10-CM

## 2023-07-22 MED ORDER — METOPROLOL SUCCINATE ER 25 MG PO TB24: 25.0000 mg | ORAL_TABLET | Freq: Every day | ORAL | 3 refills | Status: DC

## 2023-07-22 NOTE — Telephone Encounter (Signed)
 Requested Prescriptions   Signed Prescriptions Disp Refills   metoprolol  succinate (TOPROL  XL) 25 MG 24 hr tablet 90 tablet 3    Sig: Take 1 tablet (25 mg total) by mouth daily.    Authorizing Provider: Constancia Delton    Ordering User: Duayne Gey   Last office visit:  07/16/23 with plan to f/u in 12 months Next office visit: none/does have active recall

## 2023-08-13 ENCOUNTER — Ambulatory Visit: Payer: Self-pay | Admitting: Family Medicine

## 2023-09-12 ENCOUNTER — Ambulatory Visit: Payer: Medicare Other | Admitting: Family Medicine

## 2023-09-12 ENCOUNTER — Encounter: Payer: Self-pay | Admitting: Family Medicine

## 2023-09-12 VITALS — BP 133/86 | HR 98 | Ht 72.0 in | Wt 279.1 lb

## 2023-09-12 DIAGNOSIS — Z13228 Encounter for screening for other metabolic disorders: Secondary | ICD-10-CM

## 2023-09-12 DIAGNOSIS — Z1211 Encounter for screening for malignant neoplasm of colon: Secondary | ICD-10-CM

## 2023-09-12 DIAGNOSIS — R739 Hyperglycemia, unspecified: Secondary | ICD-10-CM

## 2023-09-12 DIAGNOSIS — Z130 Encounter for screening for diseases of the blood and blood-forming organs and certain disorders involving the immune mechanism: Secondary | ICD-10-CM | POA: Diagnosis not present

## 2023-09-12 DIAGNOSIS — Z1329 Encounter for screening for other suspected endocrine disorder: Secondary | ICD-10-CM | POA: Diagnosis not present

## 2023-09-12 DIAGNOSIS — Z1159 Encounter for screening for other viral diseases: Secondary | ICD-10-CM | POA: Diagnosis not present

## 2023-09-12 DIAGNOSIS — I4821 Permanent atrial fibrillation: Secondary | ICD-10-CM

## 2023-09-12 DIAGNOSIS — Z1379 Encounter for other screening for genetic and chromosomal anomalies: Secondary | ICD-10-CM

## 2023-09-12 DIAGNOSIS — G629 Polyneuropathy, unspecified: Secondary | ICD-10-CM

## 2023-09-12 DIAGNOSIS — I700 Atherosclerosis of aorta: Secondary | ICD-10-CM

## 2023-09-12 DIAGNOSIS — C678 Malignant neoplasm of overlapping sites of bladder: Secondary | ICD-10-CM | POA: Diagnosis not present

## 2023-09-12 DIAGNOSIS — Z7689 Persons encountering health services in other specified circumstances: Secondary | ICD-10-CM

## 2023-09-12 NOTE — Progress Notes (Signed)
 New patient visit   Patient: Francis Jones   DOB: March 22, 1951   73 y.o. Male  MRN: [id number] Visit Date: 09/12/2023  Today's healthcare provider: Carlean Charter, DO   Chief Complaint  Patient presents with   New Patient (Initial Visit)   Subjective    [name] is a 73 y.o. male who presents today as a new patient to establish care.  HPI   EDDER Jones "Josiah Nigh" is a 73 year old male with atrial fibrillation and bladder cancer who presents to reestablish care. He was previously under the care of Dr. Oletta Berry.  He has a history of atrial fibrillation managed with metoprolol. The condition is permanent, and he is not on anticoagulation therapy. He feels his system has adjusted to the atrial fibrillation without significant issues. He takes metoprolol and a baby aspirin regularly.  His bladder cancer is monitored by Dr. Cherylene Corrente with cystoscopy every six months, the next scheduled for April 9th. Occasionally, small tumors are found and removed, followed by a chemo wash if necessary. He feels this condition is under control and not distressing.  He experiences neuropathy in his feet, described as a nuisance but not severe enough to require treatment. He has not taken any medication for it and reports no significant impact on daily activities. No numbness or tingling beyond neuropathy in feet.  He is not on cholesterol medication and has no history of diabetes. He takes various supplements but admits to inconsistent use.  Socially, he is a retired Publishing copy with a PhD, has traveled extensively, and has two sons, one in [place] and one in [place]. He recently lost his wife of 53 years and is considering counseling services offered by hospice. He will be starting getting massages as a form of self-care, something his wife had long recommended he do.    Past Medical History:  Diagnosis Date   Allergic rhinitis    Aortic atherosclerosis (HCC)    Arthritis    Atrial  fibrillation (HCC) 12/16/2020   a.) CHA2DS2VASc = 2 (age, vascular disease history);  b.) rate/rhythm maintained on oral metoprolol succinate; has refused DCCV and chronic oral anticoagulation   Cataract    Coronary artery calcification seen on CT scan    Depression    Diverticulosis    Dyspnea    HLD (hyperlipidemia)    PONV (postoperative nausea and vomiting)    Urothelial carcinoma of bladder (HCC) 02/04/2018   a.) s/p TURBT 02/04/2018: pathology (+) high grade non-invasive urothelial carcinoma (no muscularis propria invasion); b.) s/p TURBT 10/13/2019 --> pathology (+): low grade non-invasive urothelial carcinoma (no muscularis propria invasion); c.) s/p TURBT 12/27/2020: pathology (+) low grade non-invasive urothelial carcinoma (no muscularis propria invasion)   Past Surgical History:  Procedure Laterality Date   APPENDECTOMY  1973   BLADDER INSTILLATION N/A 12/04/2022   Procedure: BLADDER INSTILLATION OF GEMCITABINE;  Surgeon: Geraline Knapp, MD;  Location: [facility];  Service: Urology;  Laterality: N/A;   CYSTOSCOPY WITH BIOPSY N/A 12/27/2020   Procedure: CYSTOSCOPY WITH BLADDER BIOPSY;  Surgeon: Geraline Knapp, MD;  Location: [facility];  Service: Urology;  Laterality: N/A;   CYSTOSCOPY WITH FULGERATION N/A 12/27/2020   Procedure: CYSTOSCOPY WITH FULGERATION;  Surgeon: Geraline Knapp, MD;  Location: [facility];  Service: Urology;  Laterality: N/A;   CYSTOSCOPY WITH STENT PLACEMENT Right 02/04/2018   Procedure: CYSTOSCOPY;  Surgeon: Geraline Knapp, MD;  Location: [facility];  Service: Urology;  Laterality: Right;  FINGER SURGERY Left 2001   titanium in left 5th finger   FRACTURE SURGERY     HERNIA REPAIR  2000   SHOULDER SURGERY Left 2009   TRANSURETHRAL RESECTION OF BLADDER TUMOR N/A 02/04/2018   Procedure: TRANSURETHRAL RESECTION OF BLADDER TUMOR (TURBT) with gemcitabine;  Surgeon: Geraline Knapp, MD;  Location: [facility];  Service: Urology;  Laterality: N/A;   TRANSURETHRAL  RESECTION OF BLADDER TUMOR N/A 10/13/2019   Procedure: TRANSURETHRAL RESECTION OF BLADDER TUMOR (TURBT) with gemcitabine;  Surgeon: Geraline Knapp, MD;  Location: [facility];  Service: Urology;  Laterality: N/A;   TRANSURETHRAL RESECTION OF BLADDER TUMOR N/A 12/04/2022   Procedure: TRANSURETHRAL RESECTION OF BLADDER TUMOR (TURBT);  Surgeon: Geraline Knapp, MD;  Location: [facility];  Service: Urology;  Laterality: N/A;   VASECTOMY  1987   Family Status  Relation Name Status   Father  (Not Specified)  No partnership data on file   Family History  Problem Relation Age of Onset   Alzheimer's disease Father    Social History   Socioeconomic History   Marital status: Widowed    Spouse name: Dayla Eva   Number of children: Not on file   Years of education: Not on file   Highest education level: Doctorate  Occupational History   Occupation: retired Teacher, English as a foreign language  Tobacco Use   Smoking status: Former    Current packs/day: 0.00    Types: Cigarettes    Quit date: 1979    Years since quitting: 46.3   Smokeless tobacco: Never  Vaping Use   Vaping status: Never Used  Substance and Sexual Activity   Alcohol use: Yes    Alcohol/week: 5.0 standard drinks of alcohol    Types: 3 Glasses of wine, 1 Cans of beer, 1 Shots of liquor per week    Comment: occasional   Drug use: Not Currently    Types: LSD, Opium    Comment: as a teenager   Sexual activity: Yes    Birth control/protection: None  Other Topics Concern   Not on file  Social History Narrative   Pt lives with wife; retd- Stage manager- Nurse, children's. 5 years smoker- remote.    Social Drivers of Corporate investment banker Strain: Low Risk  (09/10/2023)   Overall Financial Resource Strain (CARDIA)    Difficulty of Paying Living Expenses: Not hard at all  Food Insecurity: No Food Insecurity (09/10/2023)   Hunger Vital Sign    Worried About Running Out of Food in the Last Year: Never true    Ran Out of Food in the Last Year: Never  true  Transportation Needs: No Transportation Needs (09/10/2023)   PRAPARE - Administrator, Civil Service (Medical): No    Lack of Transportation (Non-Medical): No  Physical Activity: Sufficiently Active (09/10/2023)   Exercise Vital Sign    Days of Exercise per Week: 4 days    Minutes of Exercise per Session: 50 min  Stress: Stress Concern Present (09/10/2023)   [employer] of Occupational Health - Occupational Stress Questionnaire    Feeling of Stress : Very much  Social Connections: Moderately Integrated (09/10/2023)   Social Connection and Isolation Panel [NHANES]    Frequency of Communication with Friends and Family: Three times a week    Frequency of Social Gatherings with Friends and Family: Once a week    Attends Religious Services: More than 4 times per year    Active Member of Clubs or Organizations: Yes  Attends Banker Meetings: More than 4 times per year    Marital Status: Widowed   Outpatient Medications Prior to Visit  Medication Sig   aspirin EC 81 MG tablet Take 81 mg by mouth daily. Swallow whole.   metoprolol succinate (TOPROL XL) 25 MG 24 hr tablet Take 1 tablet (25 mg total) by mouth daily.   No facility-administered medications prior to visit.   Allergies  Allergen Reactions   Hydrocodone-Acetaminophen Nausea And Vomiting   Oxycodone Nausea And Vomiting    Immunization History  Administered Date(s) Administered   Influenza, High Dose Seasonal PF 05/01/2016, 03/19/2022   Influenza,inj,Quad PF,6+ Mos 03/25/2019   Influenza-Unspecified 02/19/2017, 04/03/2018, 02/24/2020, 03/19/2022, 03/04/2023   Moderna Covid-19 Fall Seasonal Vaccine 23yrs & older 12/14/2022   PFIZER Comirnaty(Gray Top)Covid-19 Tri-Sucrose Vaccine 10/05/2020   PFIZER(Purple Top)SARS-COV-2 Vaccination 07/24/2019, 08/19/2019, 03/10/2020   Pfizer Covid-19 Vaccine Bivalent Booster 38yrs & up 02/24/2021, 11/16/2021, 03/04/2023   Pfizer(Comirnaty)Fall Seasonal Vaccine  12 years and older 03/19/2022   Pneumococcal Conjugate-13 05/01/2016   Respiratory Syncytial Virus Vaccine,Recomb Aduvanted(Arexvy) 05/29/2022   Td 04/20/2004, 12/30/2015   Tdap 12/30/2015    Health Maintenance  Topic Date Due   Medicare Annual Wellness (AWV)  Never done   Zoster Vaccines- Shingrix (1 of 2) Never done   Pneumonia Vaccine 64+ Years old (2 of 2 - PPSV23) 05/01/2017   Colonoscopy  01/04/2019   COVID-19 Vaccine (10 - 2024-25 season) 04/29/2023   INFLUENZA VACCINE  01/10/2024   DTaP/Tdap/Td (4 - Td or Tdap) 12/29/2025   Hepatitis C Screening  Completed   HPV VACCINES  Aged Out   Meningococcal B Vaccine  Aged Out    Patient Care Team: Keri Tavella, Asencion Blacksmith, DO as PCP - General (Family Medicine) Constancia Delton, MD as PCP - Cardiology (Cardiology) Boyce Byes, MD as PCP - Electrophysiology (Cardiology) Dasher, Margette Sheldon, MD (Dermatology)       Objective    BP 133/86   Pulse 98   Ht 6' (1.829 m)   Wt 279 lb 1.6 oz (126.6 kg)   SpO2 95%   BMI 37.85 kg/m     Physical Exam Vitals and nursing note reviewed.  Constitutional:      General: He is not in acute distress.    Appearance: Normal appearance.  HENT:     Head: Normocephalic and atraumatic.  Eyes:     General: No scleral icterus.    Conjunctiva/sclera: Conjunctivae normal.  Cardiovascular:     Rate and Rhythm: Normal rate.  Pulmonary:     Effort: Pulmonary effort is normal.  Neurological:     Mental Status: He is alert and oriented to person, place, and time. Mental status is at baseline.  Psychiatric:        Mood and Affect: Mood normal.        Behavior: Behavior normal.     Depression Screen    09/12/2023    9:19 AM  PHQ 2/9 Scores  PHQ - 2 Score 6  PHQ- 9 Score 18   Results for orders placed or performed in visit on 09/12/23  Comprehensive metabolic panel with GFR  Result Value Ref Range   Glucose 142 (H) 70 - 99 mg/dL   BUN 8 8 - 27 mg/dL   Creatinine, Ser 5.36 0.76 - 1.27  mg/dL   eGFR 91 >64 QI/HKV/4.25   BUN/Creatinine Ratio 9 (L) 10 - 24   Sodium 140 134 - 144 mmol/L   Potassium 4.9 3.5 - 5.2 mmol/L  Chloride 100 96 - 106 mmol/L   CO2 27 20 - 29 mmol/L   Calcium 9.4 8.6 - 10.2 mg/dL   Total Protein 6.7 6.0 - 8.5 g/dL   Albumin 4.2 3.8 - 4.8 g/dL   Globulin, Total 2.5 1.5 - 4.5 g/dL   Bilirubin Total 0.4 0.0 - 1.2 mg/dL   Alkaline Phosphatase 91 44 - 121 IU/L   AST 18 0 - 40 IU/L   ALT 20 0 - 44 IU/L  Hemoglobin A1c  Result Value Ref Range   Hgb A1c MFr Bld 7.0 (H) 4.8 - 5.6 %   Est. average glucose Bld gHb Est-mCnc 154 mg/dL  Lipid panel  Result Value Ref Range   Cholesterol, Total 219 (H) 100 - 199 mg/dL   Triglycerides 409 (H) 0 - 149 mg/dL   HDL 43 >81 mg/dL   VLDL Cholesterol Cal 29 5 - 40 mg/dL   LDL Chol Calc (NIH) 191 (H) 0 - 99 mg/dL   Chol/HDL Ratio 5.1 (H) 0.0 - 5.0 ratio  TSH Rfx on Abnormal to Free T4  Result Value Ref Range   TSH 1.950 0.450 - 4.500 uIU/mL  HCV Ab w Reflex to Quant PCR  Result Value Ref Range   HCV Ab Non Reactive Non Reactive  Interpretation:  Result Value Ref Range   HCV Interp 1: Comment     Assessment & Plan     Establishing care with new doctor, encounter for  Permanent atrial fibrillation [facility]) Assessment & Plan: Managed by cardiology; defer to specialist management.  CHA2DS2VASc = 2 (age, vascular disease history);  b.) rate/rhythm maintained on oral metoprolol succinate; has refused DCCV and chronic oral anticoagulation  Orders: -     Comprehensive metabolic panel with GFR -     TSH Rfx on Abnormal to Free T4  Cancer of overlapping sites of bladder (HCC)  Obesity, morbid (HCC) -     Hemoglobin A1c  Peripheral polyneuropathy  Aortic atherosclerosis (HCC) Assessment & Plan: Noted.  Blood pressure well-controlled on metoprolol (which is prescribed for A-fib).  Continue aspirin.  Patient has declined statins and anticoagulation.  Orders: -     Comprehensive metabolic panel with GFR -      Lipid panel  Encounter for colorectal cancer screening -     Ambulatory referral to Gastroenterology  Screening for endocrine, metabolic and immunity disorder -     Hemoglobin A1c  Hyperglycemia -     Hemoglobin A1c  Encounter for hepatitis C screening test for low risk patient -     HCV Ab w Reflex to Quant PCR -     Interpretation:  Encounter for genetic screening -     Gene[employer] Molecular Screen    Bladder Cancer Condition controlled with regular cystoscopies.  - Continue regular cystoscopies. - Attend cystoscopy appointment on April 9th and intermittent chemo washes.  Permanent Atrial Fibrillation Chronic atrial fibrillation managed with metoprolol. Cardioversion not pursued. Anticoagulation discussed with cardiology, deferred. Reevaluation at age 47. - Continue metoprolol. - Follow up with cardiologist in one year.  Peripheral Neuropathy Chronic peripheral neuropathy causing tingling in feet, considered a nuisance.  Grief Management Discussed potential counseling for grief management. Hospice offers services for 13 months post-loss. - Consider hospice counseling services.  General Health Maintenance Routine health maintenance due. Agreed to hepatitis C screening, not HIV due to Medicare restrictions. - Order metabolic panel and cholesterol panel. - Refer for colonoscopy. - Screen for hepatitis C. - Schedule Medicare annual wellness visit in 4-6  weeks. - Follows with dermatology, Dr. Tresa Frohlich; patient to reschedule appointment.    Return in about 4 weeks (around 10/10/2023) for mAWV.     I discussed the assessment and treatment plan with the patient  The patient was provided an opportunity to ask questions and all were answered. The patient agreed with the plan and demonstrated an understanding of the instructions.   The patient was advised to call back or seek an in-person evaluation if the symptoms worsen or if the condition fails to improve as  anticipated.    Carlean Charter, DO  [facility] [phone] (phone) [phone] (fax)  [facility]

## 2023-09-12 NOTE — Assessment & Plan Note (Signed)
 Managed by cardiology; defer to specialist management.  CHA2DS2VASc = 2 (age, vascular disease history);  b.) rate/rhythm maintained on oral metoprolol succinate; has refused DCCV and chronic oral anticoagulation

## 2023-09-13 ENCOUNTER — Other Ambulatory Visit: Payer: Self-pay

## 2023-09-13 ENCOUNTER — Encounter: Payer: Self-pay | Admitting: Internal Medicine

## 2023-09-13 LAB — COMPREHENSIVE METABOLIC PANEL WITH GFR
ALT: 20 IU/L (ref 0–44)
AST: 18 IU/L (ref 0–40)
Albumin: 4.2 g/dL (ref 3.8–4.8)
Alkaline Phosphatase: 91 IU/L (ref 44–121)
BUN/Creatinine Ratio: 9 — ABNORMAL LOW (ref 10–24)
BUN: 8 mg/dL (ref 8–27)
Bilirubin Total: 0.4 mg/dL (ref 0.0–1.2)
CO2: 27 mmol/L (ref 20–29)
Calcium: 9.4 mg/dL (ref 8.6–10.2)
Chloride: 100 mmol/L (ref 96–106)
Creatinine, Ser: 0.86 mg/dL (ref 0.76–1.27)
Globulin, Total: 2.5 g/dL (ref 1.5–4.5)
Glucose: 142 mg/dL — ABNORMAL HIGH (ref 70–99)
Potassium: 4.9 mmol/L (ref 3.5–5.2)
Sodium: 140 mmol/L (ref 134–144)
Total Protein: 6.7 g/dL (ref 6.0–8.5)
eGFR: 91 mL/min/{1.73_m2} (ref >59)

## 2023-09-13 LAB — LIPID PANEL
Chol/HDL Ratio: 5.1 ratio — ABNORMAL HIGH (ref 0.0–5.0)
Cholesterol, Total: 219 mg/dL — ABNORMAL HIGH (ref 100–199)
HDL: 43 mg/dL (ref >39)
LDL Chol Calc (NIH): 147 mg/dL — ABNORMAL HIGH (ref 0–99)
Triglycerides: 160 mg/dL — ABNORMAL HIGH (ref 0–149)
VLDL Cholesterol Cal: 29 mg/dL (ref 5–40)

## 2023-09-13 LAB — HCV INTERPRETATION

## 2023-09-13 LAB — HEMOGLOBIN A1C
Est. average glucose Bld gHb Est-mCnc: 154 mg/dL
Hgb A1c MFr Bld: 7 % — ABNORMAL HIGH (ref 4.8–5.6)

## 2023-09-13 LAB — TSH RFX ON ABNORMAL TO FREE T4: TSH: 1.95 u[IU]/mL (ref 0.450–4.500)

## 2023-09-13 LAB — HCV AB W REFLEX TO QUANT PCR: HCV Ab: NONREACTIVE

## 2023-09-17 ENCOUNTER — Encounter: Payer: Self-pay | Admitting: Urology

## 2023-09-17 NOTE — Telephone Encounter (Signed)
Pt returned call to schedule colonoscopy ?

## 2023-09-18 ENCOUNTER — Telehealth: Payer: Self-pay

## 2023-09-18 ENCOUNTER — Other Ambulatory Visit: Payer: Self-pay

## 2023-09-18 ENCOUNTER — Encounter: Payer: Self-pay | Admitting: Urology

## 2023-09-18 ENCOUNTER — Ambulatory Visit (INDEPENDENT_AMBULATORY_CARE_PROVIDER_SITE_OTHER): Payer: Self-pay | Admitting: Urology

## 2023-09-18 VITALS — BP 159/109 | HR 97 | Ht 72.0 in | Wt 279.0 lb

## 2023-09-18 DIAGNOSIS — Z8551 Personal history of malignant neoplasm of bladder: Secondary | ICD-10-CM

## 2023-09-18 DIAGNOSIS — Z1211 Encounter for screening for malignant neoplasm of colon: Secondary | ICD-10-CM

## 2023-09-18 DIAGNOSIS — N3289 Other specified disorders of bladder: Secondary | ICD-10-CM

## 2023-09-18 DIAGNOSIS — N5201 Erectile dysfunction due to arterial insufficiency: Secondary | ICD-10-CM

## 2023-09-18 DIAGNOSIS — N529 Male erectile dysfunction, unspecified: Secondary | ICD-10-CM

## 2023-09-18 MED ORDER — TADALAFIL 20 MG PO TABS: ORAL_TABLET | ORAL | 0 refills | Status: DC

## 2023-09-18 MED ORDER — NA SULFATE-K SULFATE-MG SULF 17.5-3.13-1.6 GM/177ML PO SOLN: 1.0000 | Freq: Once | ORAL | 0 refills | Status: AC

## 2023-09-18 NOTE — Telephone Encounter (Addendum)
 Pt requesting appointment placed on recall

## 2023-09-18 NOTE — Telephone Encounter (Signed)
 Gastroenterology Pre-Procedure Review  Request Date: 10/17/23 Requesting Physician: Dr. Allegra Lai  Note: Patient stated that anesthesia causes him to experience really bad nausea, however all his procedures done by Dr. Lonna Cobb under anesthesia he never had any problems and has requested that physician look at the anesthesia records from previous procedures requiring sedation to see how it was managed.  Informed him that there is medicine that can be placed in his IV fluids to help with nausea.  PATIENT REVIEW QUESTIONS: The patient responded to the following health history questions as indicated:    1. Are you having any GI issues? no 2. Do you have a personal history of Polyps? no 3. Do you have a family history of Colon Cancer or Polyps? no 4. Diabetes Mellitus? no 5. Joint replacements in the past 12 months?no 6. Major health problems in the past 3 months?no 7. Any artificial heart valves, MVP, or defibrillator?no 8. Cardiac? Yes Permanent AFIB Clearance [facility]    MEDICATIONS & ALLERGIES:    Patient reports the following regarding taking any anticoagulation/antiplatelet therapy:   Plavix, Coumadin, Eliquis, Xarelto, Lovenox, Pradaxa, Brilinta, or Effient? no Aspirin? yes (81MG  DAILY)  Patient confirms/reports the following medications:  Current Outpatient Medications  Medication Sig Dispense Refill   aspirin EC 81 MG tablet Take 81 mg by mouth daily. Swallow whole.     metoprolol succinate (TOPROL XL) 25 MG 24 hr tablet Take 1 tablet (25 mg total) by mouth daily. 90 tablet 3   tadalafil (CIALIS) 20 MG tablet 1 tab 1 hour prior to intercourse 10 tablet 0   No current facility-administered medications for this visit.    Patient confirms/reports the following allergies:  Allergies  Allergen Reactions   Hydrocodone-Acetaminophen Nausea And Vomiting   Oxycodone Nausea And Vomiting    No orders of the defined types were placed in this encounter.   AUTHORIZATION  INFORMATION Primary Insurance: 1D#: Group #:  Secondary Insurance: 1D#: Group #:  SCHEDULE INFORMATION: Date: 10/17/23 Time: Location: [facility]

## 2023-09-18 NOTE — Progress Notes (Signed)
   09/18/23  Urologic history 1. Low grade Ta urethral carcinoma of the bladder TURBT August 2019 papillary bladder tumor High-grade urothelial carcinoma Ta Induction BCG completed November 2019 TURBT 10/13/2019 10 mm papillary lesion right bladder neck, received post resection gemcitabine Pathology Ta urothelial carcinoma (low-grade) TURBT 12/2020 for 5 mm recurrent tumor posterior wall Pathology Ta low-grade urothelial carcinoma 6-week course of intravesical gemcitabine in oncology TURBT for 1 cm recurrent bladder tumor 12/04/22   HPI: 73 y.o. male presents for follow-up surveillance cystoscopy.  No complaints.  Denies gross hematuria.  UA today microscopy negative  Blood pressure 120/82, pulse 92, height 6' (1.829 m), weight 279 lb (126.6 kg). NED. A&Ox3.   No respiratory distress   Abd soft, NT, ND Normal phallus with bilateral descended testicles  Cystoscopy Procedure Note  Patient identification was confirmed, informed consent was obtained, and patient was prepped using Betadine solution.  Lidocaine jelly was administered per urethral meatus.     Pre-Procedure: - Inspection reveals a normal caliber urethral meatus.  Procedure: The flexible cystoscope was introduced without difficulty - No urethral strictures/lesions are present. -  Moderate lateral lobe enlargement  prostate  - Median lobe present.  Small papillary lesion anterior prostate near bladder neck. - Bilateral ureteral orifices identified - Bladder mucosa  reveals no erythema, solid or papillary lesions - No bladder stones - Moderate trabeculation  Retroflexion shows no abnormalities   Post-Procedure: - Patient tolerated the procedure well  Assessment/ Plan: Small papillary lesion prostatic urethra which could be inflammatory This would be amenable to in office biopsy and fulguration which we discussed versus in same-day surgery.  He would like to schedule in office. He has also has ED and has requested  trial of a PDE 5 inhibitor.  Rx tadalafil sent to pharmacy   Geraline Knapp, MD

## 2023-09-19 ENCOUNTER — Other Ambulatory Visit: Payer: Self-pay

## 2023-09-19 ENCOUNTER — Encounter: Payer: Self-pay | Admitting: Family Medicine

## 2023-09-19 ENCOUNTER — Telehealth: Payer: Self-pay | Admitting: *Deleted

## 2023-09-19 NOTE — Telephone Encounter (Signed)
 Pt has been scheduled 10/01/23 tele preop appt. Med rec and consent are done.    Pt asked me to please let Dr. Azucena Cecil know that his wife passed away 2023/08/08. I assured the pt that I will pass his message along to the doctor.       Patient Consent for Virtual Visit        MASAJI BILLUPS has provided verbal consent on 09/19/2023 for a virtual visit (video or telephone).   CONSENT FOR VIRTUAL VISIT FOR:  Trilby Leaver  By participating in this virtual visit I agree to the following:  I hereby voluntarily request, consent and authorize [facility] and its employed or contracted physicians, physician assistants, nurse practitioners or other licensed health care professionals (the Practitioner), to provide me with telemedicine health care services (the "Services") as deemed necessary by the treating Practitioner. I acknowledge and consent to receive the Services by the Practitioner via telemedicine. I understand that the telemedicine visit will involve communicating with the Practitioner through live audiovisual communication technology and the disclosure of certain medical information by electronic transmission. I acknowledge that I have been given the opportunity to request an in-person assessment or other available alternative prior to the telemedicine visit and am voluntarily participating in the telemedicine visit.  I understand that I have the right to withhold or withdraw my consent to the use of telemedicine in the course of my care at any time, without affecting my right to future care or treatment, and that the Practitioner or I may terminate the telemedicine visit at any time. I understand that I have the right to inspect all information obtained and/or recorded in the course of the telemedicine visit and may receive copies of available information for a reasonable fee.  I understand that some of the potential risks of receiving the Services via telemedicine include:  Delay  or interruption in medical evaluation due to technological equipment failure or disruption; Information transmitted may not be sufficient (e.g. poor resolution of images) to allow for appropriate medical decision making by the Practitioner; and/or  In rare instances, security protocols could fail, causing a breach of personal health information.  Furthermore, I acknowledge that it is my responsibility to provide information about my medical history, conditions and care that is complete and accurate to the best of my ability. I acknowledge that Practitioner's advice, recommendations, and/or decision may be based on factors not within their control, such as incomplete or inaccurate data provided by me or distortions of diagnostic images or specimens that may result from electronic transmissions. I understand that the practice of medicine is not an exact science and that Practitioner makes no warranties or guarantees regarding treatment outcomes. I acknowledge that a copy of this consent can be made available to me via my patient portal [facility] MyChart), or I can request a printed copy by calling the office of  [facility].    I understand that my insurance will be billed for this visit.   I have read or had this consent read to me. I understand the contents of this consent, which adequately explains the benefits and risks of the Services being provided via telemedicine.  I have been provided ample opportunity to ask questions regarding this consent and the Services and have had my questions answered to my satisfaction. I give my informed consent for the services to be provided through the use of telemedicine in my medical care

## 2023-09-19 NOTE — Telephone Encounter (Signed)
 Pt has been scheduled 10/01/23 tele preop appt. Med rec and consent are done.     Patient Consent for Virtual Visit        Francis Jones has provided verbal consent on 09/19/2023 for a virtual visit (video or telephone).   CONSENT FOR VIRTUAL VISIT FOR:  Francis Jones  By participating in this virtual visit I agree to the following:  I hereby voluntarily request, consent and authorize [facility] and its employed or contracted physicians, physician assistants, nurse practitioners or other licensed health care professionals (the Practitioner), to provide me with telemedicine health care services (the "Services") as deemed necessary by the treating Practitioner. I acknowledge and consent to receive the Services by the Practitioner via telemedicine. I understand that the telemedicine visit will involve communicating with the Practitioner through live audiovisual communication technology and the disclosure of certain medical information by electronic transmission. I acknowledge that I have been given the opportunity to request an in-person assessment or other available alternative prior to the telemedicine visit and am voluntarily participating in the telemedicine visit.  I understand that I have the right to withhold or withdraw my consent to the use of telemedicine in the course of my care at any time, without affecting my right to future care or treatment, and that the Practitioner or I may terminate the telemedicine visit at any time. I understand that I have the right to inspect all information obtained and/or recorded in the course of the telemedicine visit and may receive copies of available information for a reasonable fee.  I understand that some of the potential risks of receiving the Services via telemedicine include:  Delay or interruption in medical evaluation due to technological equipment failure or disruption; Information transmitted may not be sufficient (e.g. poor  resolution of images) to allow for appropriate medical decision making by the Practitioner; and/or  In rare instances, security protocols could fail, causing a breach of personal health information.  Furthermore, I acknowledge that it is my responsibility to provide information about my medical history, conditions and care that is complete and accurate to the best of my ability. I acknowledge that Practitioner's advice, recommendations, and/or decision may be based on factors not within their control, such as incomplete or inaccurate data provided by me or distortions of diagnostic images or specimens that may result from electronic transmissions. I understand that the practice of medicine is not an exact science and that Practitioner makes no warranties or guarantees regarding treatment outcomes. I acknowledge that a copy of this consent can be made available to me via my patient portal [facility] MyChart), or I can request a printed copy by calling the office of [facility].    I understand that my insurance will be billed for this visit.   I have read or had this consent read to me. I understand the contents of this consent, which adequately explains the benefits and risks of the Services being provided via telemedicine.  I have been provided ample opportunity to ask questions regarding this consent and the Services and have had my questions answered to my satisfaction. I give my informed consent for the services to be provided through the use of telemedicine in my medical care

## 2023-09-19 NOTE — Telephone Encounter (Signed)
   Pre-operative Risk Assessment    Patient Name: Francis Jones  DOB: 07-Oct-1950 MRN: [id number]   Date of last office visit: 07/16/23 DR. AGBOR-ETANG Date of next office visit: NONE   Request for Surgical Clearance    Procedure:   COLONOSCOPY  Date of Surgery:  Clearance 10/17/23                                Surgeon:  DR. Lannette Donath Surgeon's Group or Practice Name:  [facility] Phone number:  [phone] Iva Lento, CMA Fax number:  [phone]   Type of Clearance Requested:   - Medical ; PER FORM NO MEDICATIONS NEEDING TO BE HELD   Type of Anesthesia:  General    Additional requests/questions:    [name]   09/19/2023, 12:10 PM

## 2023-09-19 NOTE — Telephone Encounter (Signed)
   Name: Francis Jones  DOB: Mar 02, 1951  MRN: [id number]  Primary Cardiologist: Debbe Odea, MD   Preoperative team, please contact this patient and set up a phone call appointment for further preoperative risk assessment. Please obtain consent and complete medication review.  Please also contact requesting office to verify that ASA 81 mg is not needed to be held prior to colonoscopy procedure.  I confirm that guidance regarding antiplatelet and oral anticoagulation therapy has been completed and, if necessary, noted below.  None but will verify with requesting office for ASA 81 mg  I also confirmed the patient resides in the state of [place]. As per [facility] Medical Board telemedicine laws, the patient must reside in the state in which the provider is licensed.   Napoleon Form, Leodis Rains, NP 09/19/2023, 12:14 PM [facility]

## 2023-09-21 ENCOUNTER — Encounter: Payer: Self-pay | Admitting: Urology

## 2023-09-24 ENCOUNTER — Encounter: Payer: Self-pay | Admitting: Family Medicine

## 2023-09-24 DIAGNOSIS — I700 Atherosclerosis of aorta: Secondary | ICD-10-CM | POA: Insufficient documentation

## 2023-09-24 NOTE — Assessment & Plan Note (Signed)
 Noted.  Blood pressure well-controlled on metoprolol (which is prescribed for A-fib).  Continue aspirin.  Patient has declined statins and anticoagulation.

## 2023-09-26 NOTE — Telephone Encounter (Signed)
 Pt has an appt at heart care on 10/01/23.  Follow up after 10/01/23.  Francis Jones, CMA

## 2023-10-01 ENCOUNTER — Ambulatory Visit: Attending: Cardiology | Admitting: Nurse Practitioner

## 2023-10-01 DIAGNOSIS — Z01810 Encounter for preprocedural cardiovascular examination: Secondary | ICD-10-CM | POA: Insufficient documentation

## 2023-10-01 NOTE — Progress Notes (Signed)
 Virtual Visit via Telephone Note   Because of Francis Jones co-morbid illnesses, he is at least at moderate risk for complications without adequate follow up.  This format is felt to be most appropriate for this patient at this time.  Due to technical limitations with video connection (technology), today's appointment will be conducted as an audio only telehealth visit, and [name] verbally agreed to proceed in this manner.   All issues noted in this document were discussed and addressed.  No physical exam could be performed with this format.  Evaluation Performed:  Preoperative cardiovascular risk assessment _____________   Date:  10/01/2023   Patient ID:  Francis Jones, DOB Sep 05, 1950, MRN [id number] Patient Location:  Home Provider location:   Office  Primary Care Provider:  Carlean Charter, DO Primary Cardiologist:  Constancia Delton, MD  Chief Complaint / Patient Profile   73 y.o. y/o male with a h/o persistent atrial fibrillation and obesity who is pending colonoscopy on 10/17/2023 with Dr. Ellis Guys of [facility] and presents today for telephonic preoperative cardiovascular risk assessment.  History of Present Illness    Francis Jones is a 73 y.o. male who presents via audio/video conferencing for a telehealth visit today.  Pt was last seen in cardiology clinic on 07/16/2023 by Dr. Junnie Olives. At that time MASAHIRO IGLESIA was doing well.  The patient is now pending procedure as outlined above. Since his last visit, he has done well from a cardiac standpoint.  He shared that his wife died in 08/16/23.  He is actively grieving.  He denies chest pain, palpitations, dyspnea, pnd, orthopnea, n, v, dizziness, syncope, edema, weight gain, or early satiety. All other systems reviewed and are otherwise negative except as noted above.   Past Medical History    Past Medical History:  Diagnosis Date   Allergic rhinitis    Aortic atherosclerosis (HCC)    Arthritis     Atrial fibrillation (HCC) 12/16/2020   a.) CHA2DS2VASc = 2 (age, vascular disease history);  b.) rate/rhythm maintained on oral metoprolol  succinate; has refused DCCV and chronic oral anticoagulation   Cataract    Coronary artery calcification seen on CT scan    Depression    Diverticulosis    Dyspnea    HLD (hyperlipidemia)    PONV (postoperative nausea and vomiting)    Urothelial carcinoma of bladder (HCC) 02/04/2018   a.) s/p TURBT 02/04/2018: pathology (+) high grade non-invasive urothelial carcinoma (no muscularis propria invasion); b.) s/p TURBT 10/13/2019 --> pathology (+): low grade non-invasive urothelial carcinoma (no muscularis propria invasion); c.) s/p TURBT 12/27/2020: pathology (+) low grade non-invasive urothelial carcinoma (no muscularis propria invasion)   Past Surgical History:  Procedure Laterality Date   APPENDECTOMY  1973   BLADDER INSTILLATION N/A 12/04/2022   Procedure: BLADDER INSTILLATION OF GEMCITABINE ;  Surgeon: Geraline Knapp, MD;  Location: [facility];  Service: Urology;  Laterality: N/A;   CYSTOSCOPY WITH BIOPSY N/A 12/27/2020   Procedure: CYSTOSCOPY WITH BLADDER BIOPSY;  Surgeon: Geraline Knapp, MD;  Location: [facility];  Service: Urology;  Laterality: N/A;   CYSTOSCOPY WITH FULGERATION N/A 12/27/2020   Procedure: CYSTOSCOPY WITH FULGERATION;  Surgeon: Geraline Knapp, MD;  Location: [facility];  Service: Urology;  Laterality: N/A;   CYSTOSCOPY WITH STENT PLACEMENT Right 02/04/2018   Procedure: CYSTOSCOPY;  Surgeon: Geraline Knapp, MD;  Location: [facility];  Service: Urology;  Laterality: Right;   FINGER SURGERY Left 2001   titanium in  left 5th finger   FRACTURE SURGERY     HERNIA REPAIR  2000   SHOULDER SURGERY Left 2009   TRANSURETHRAL RESECTION OF BLADDER TUMOR N/A 02/04/2018   Procedure: TRANSURETHRAL RESECTION OF BLADDER TUMOR (TURBT) with gemcitabine ;  Surgeon: Geraline Knapp, MD;  Location: [facility];  Service: Urology;  Laterality: N/A;    TRANSURETHRAL RESECTION OF BLADDER TUMOR N/A 10/13/2019   Procedure: TRANSURETHRAL RESECTION OF BLADDER TUMOR (TURBT) with gemcitabine ;  Surgeon: Geraline Knapp, MD;  Location: [facility];  Service: Urology;  Laterality: N/A;   TRANSURETHRAL RESECTION OF BLADDER TUMOR N/A 12/04/2022   Procedure: TRANSURETHRAL RESECTION OF BLADDER TUMOR (TURBT);  Surgeon: Geraline Knapp, MD;  Location: [facility];  Service: Urology;  Laterality: N/A;   VASECTOMY  1987    Allergies  Allergies  Allergen Reactions   Hydrocodone-Acetaminophen  Nausea And Vomiting   Oxycodone  Nausea And Vomiting    Home Medications    Prior to Admission medications   Medication Sig Start Date End Date Taking? Authorizing Provider  aspirin  EC 81 MG tablet Take 81 mg by mouth daily. Swallow whole.    [provider]  metoprolol  succinate (TOPROL  XL) 25 MG 24 hr tablet Take 1 tablet (25 mg total) by mouth daily. 07/22/23   Constancia Delton, MD  tadalafil  (CIALIS ) 20 MG tablet 1 tab 1 hour prior to intercourse 09/18/23   Geraline Knapp, MD    Physical Exam    Vital Signs:  AVENIR LOZINSKI does not have vital signs available for review today.  Given telephonic nature of communication, physical exam is limited. AAOx3. NAD. Normal affect.  Speech and respirations are unlabored.  Accessory Clinical Findings    None  Assessment & Plan    1.  Preoperative Cardiovascular Risk Assessment:  According to the Revised Cardiac Risk Index (RCRI), his Perioperative Risk of Major Cardiac Event is (%): 0.4. His Functional Capacity in METs is: 5.62 according to the Duke Activity Status Index (DASI).Therefore, based on ACC/AHA guidelines, patient would be at acceptable risk for the planned procedure without further cardiovascular testing.  Regarding ASA therapy, we recommend continuation of ASA throughout the perioperative period.  However, if the surgeon feels that cessation of ASA is required in the perioperative period, it  may be stopped 5-7 days prior to surgery with a plan to resume it as soon as felt to be feasible from a surgical standpoint in the post-operative period.  The patient was advised that if he develops new symptoms prior to surgery to contact our office to arrange for a follow-up visit, and he verbalized understanding.   A copy of this note will be routed to requesting surgeon.  Time:   Today, I have spent 10 minutes with the patient with telehealth technology discussing medical history, symptoms, and management plan.     Jude Norton, NP  10/01/2023, 2:16 PM

## 2023-10-03 NOTE — Telephone Encounter (Signed)
 Assessment & Plan    1.  Preoperative Cardiovascular Risk Assessment:   According to the Revised Cardiac Risk Index (RCRI), his Perioperative Risk of Major Cardiac Event is (%): 0.4. His Functional Capacity in METs is: 5.62 according to the Duke Activity Status Index (DASI).Therefore, based on ACC/AHA guidelines, patient would be at acceptable risk for the planned procedure without further cardiovascular testing.   Regarding ASA therapy, we recommend continuation of ASA throughout the perioperative period.  However, if the surgeon feels that cessation of ASA is required in the perioperative period, it may be stopped 5-7 days prior to surgery with a plan to resume it as soon as felt to be feasible from a surgical standpoint in the post-operative period.   The patient was advised that if he develops new symptoms prior to surgery to contact our office to arrange for a follow-up visit, and he verbalized understanding.

## 2023-10-17 ENCOUNTER — Other Ambulatory Visit: Payer: Self-pay

## 2023-10-17 ENCOUNTER — Encounter
Admission: RE | Disposition: A | Discharge status: Home / Self Care | Payer: Self-pay | Source: Home / Self Care | Attending: Gastroenterology

## 2023-10-17 ENCOUNTER — Telehealth: Payer: Self-pay

## 2023-10-17 ENCOUNTER — Encounter: Payer: Self-pay | Admitting: Anesthesiology

## 2023-10-17 ENCOUNTER — Ambulatory Visit
Admission: RE | Admit: 2023-10-17 | Discharge: 2023-10-17 | Disposition: A | Discharge status: Home / Self Care | Attending: Gastroenterology | Admitting: Gastroenterology

## 2023-10-17 DIAGNOSIS — Z5309 Procedure and treatment not carried out because of other contraindication: Secondary | ICD-10-CM | POA: Diagnosis not present

## 2023-10-17 DIAGNOSIS — Z1211 Encounter for screening for malignant neoplasm of colon: Secondary | ICD-10-CM

## 2023-10-17 SURGERY — COLONOSCOPY
Anesthesia: General

## 2023-10-17 MED ORDER — PEG 3350-KCL-NA BICARB-NACL 420 G PO SOLR: 4000.0000 mL | Freq: Once | ORAL | 0 refills | Status: AC

## 2023-10-17 MED ORDER — PROPOFOL 1000 MG/100ML IV EMUL
INTRAVENOUS | Status: AC
Start: 2023-10-17 — End: ?
  Filled 2023-10-17: qty 100

## 2023-10-17 MED ORDER — SODIUM CHLORIDE 0.9 % IV SOLN: INTRAVENOUS | Status: DC

## 2023-10-17 NOTE — Anesthesia Preprocedure Evaluation (Signed)
 Anesthesia Evaluation  Patient identified by MRN, date of birth, ID band Patient awake    Reviewed: Allergy & Precautions, H&P , NPO status , Patient's Chart, lab work & pertinent test results, reviewed documented beta blocker date and time   History of Anesthesia Complications (+) PONV and history of anesthetic complications  Airway Mallampati: II   Neck ROM: full    Dental  (+) Poor Dentition   Pulmonary shortness of breath, former smoker   Pulmonary exam normal        Cardiovascular Exercise Tolerance: Good + CAD  Atrial Fibrillation  Rhythm:regular Rate:Normal     Neuro/Psych  PSYCHIATRIC DISORDERS  Depression     Neuromuscular disease    GI/Hepatic negative GI ROS, Neg liver ROS,,,  Endo/Other  negative endocrine ROS    Renal/GU negative Renal ROS  negative genitourinary   Musculoskeletal   Abdominal   Peds  Hematology negative hematology ROS (+)   Anesthesia Other Findings Past Medical History: No date: Allergic rhinitis No date: Aortic atherosclerosis (HCC) No date: Arthritis 12/16/2020: Atrial fibrillation (HCC)     Comment:  a.) CHA2DS2VASc = 2 (age, vascular disease history);                b.) rate/rhythm maintained on oral metoprolol  succinate;               has refused DCCV and chronic oral anticoagulation No date: Cataract No date: Coronary artery calcification seen on CT scan No date: Depression No date: Diverticulosis No date: Dyspnea No date: HLD (hyperlipidemia) No date: PONV (postoperative nausea and vomiting) 02/04/2018: Urothelial carcinoma of bladder (HCC)     Comment:  a.) s/p TURBT 02/04/2018: pathology (+) high grade               non-invasive urothelial carcinoma (no muscularis propria               invasion); b.) s/p TURBT 10/13/2019 --> pathology (+):               low grade non-invasive urothelial carcinoma (no               muscularis propria invasion); c.) s/p TURBT  12/27/2020:               pathology (+) low grade non-invasive urothelial carcinoma              (no muscularis propria invasion) Past Surgical History: 1973: APPENDECTOMY 12/04/2022: BLADDER INSTILLATION; N/A     Comment:  Procedure: BLADDER INSTILLATION OF GEMCITABINE ;                Surgeon: Geraline Knapp, MD;  Location: [facility];                Service: Urology;  Laterality: N/A; 12/27/2020: CYSTOSCOPY WITH BIOPSY; N/A     Comment:  Procedure: CYSTOSCOPY WITH BLADDER BIOPSY;  Surgeon:               Geraline Knapp, MD;  Location: [facility];  Service:               Urology;  Laterality: N/A; 12/27/2020: CYSTOSCOPY WITH FULGERATION; N/A     Comment:  Procedure: CYSTOSCOPY WITH FULGERATION;  Surgeon:               Geraline Knapp, MD;  Location: [facility];  Service:               Urology;  Laterality: N/A; 02/04/2018: Orin Birk  WITH STENT PLACEMENT; Right     Comment:  Procedure: CYSTOSCOPY;  Surgeon: Geraline Knapp, MD;                Location: [facility];  Service: Urology;  Laterality:               Right; 2001: FINGER SURGERY; Left     Comment:  titanium in left 5th finger No date: FRACTURE SURGERY 2000: HERNIA REPAIR 2009: SHOULDER SURGERY; Left 02/04/2018: TRANSURETHRAL RESECTION OF BLADDER TUMOR; N/A     Comment:  Procedure: TRANSURETHRAL RESECTION OF BLADDER TUMOR               (TURBT) with gemcitabine ;  Surgeon: Geraline Knapp, MD;              Location: [facility];  Service: Urology;  Laterality: N/A; 10/13/2019: TRANSURETHRAL RESECTION OF BLADDER TUMOR; N/A     Comment:  Procedure: TRANSURETHRAL RESECTION OF BLADDER TUMOR               (TURBT) with gemcitabine ;  Surgeon: Geraline Knapp, MD;              Location: [facility];  Service: Urology;  Laterality: N/A; 12/04/2022: TRANSURETHRAL RESECTION OF BLADDER TUMOR; N/A     Comment:  Procedure: TRANSURETHRAL RESECTION OF BLADDER TUMOR               (TURBT);  Surgeon: Geraline Knapp, MD;  Location: [facility]                ORS;  Service: Urology;  Laterality: N/A; 1987: VASECTOMY   Reproductive/Obstetrics negative OB ROS                             Anesthesia Physical Anesthesia Plan  ASA: 3  Anesthesia Plan: General   Post-op Pain Management:    Induction:   PONV Risk Score and Plan:   Airway Management Planned:   Additional Equipment:   Intra-op Plan:   Post-operative Plan:   Informed Consent: I have reviewed the patients History and Physical, chart, labs and discussed the procedure including the risks, benefits and alternatives for the proposed anesthesia with the patient or authorized representative who has indicated his/her understanding and acceptance.     Dental Advisory Given  Plan Discussed with: CRNA  Anesthesia Plan Comments:        Anesthesia Quick Evaluation

## 2023-10-17 NOTE — OR Nursing (Signed)
 Patient stated still having brown stool feels like he is not clean. Notified Dr. Baldomero Bone and office regarding to be rescheduled.

## 2023-10-17 NOTE — Telephone Encounter (Signed)
 Colonoscopy has been rescheduled to 10/21/23 with Dr. Antony Baumgartner from today 10/17/23 due to not being cleaned out.  Prep changed to Golytely instead of Suprep this time. Instructions reviewed with patient and sent via mychart.  Thanks, [place], [place]

## 2023-10-18 ENCOUNTER — Ambulatory Visit: Admitting: Family Medicine

## 2023-10-18 ENCOUNTER — Telehealth: Payer: Self-pay

## 2023-10-18 NOTE — Telephone Encounter (Signed)
 PT has procedure on 10/21/2023 has question

## 2023-10-19 ENCOUNTER — Other Ambulatory Visit: Payer: Self-pay

## 2023-10-21 ENCOUNTER — Ambulatory Visit: Admitting: Anesthesiology

## 2023-10-21 ENCOUNTER — Encounter: Payer: Self-pay | Admitting: Gastroenterology

## 2023-10-21 ENCOUNTER — Ambulatory Visit
Admission: RE | Admit: 2023-10-21 | Discharge: 2023-10-21 | Disposition: A | Discharge status: Home / Self Care | Attending: Gastroenterology | Admitting: Gastroenterology

## 2023-10-21 ENCOUNTER — Encounter
Admission: RE | Disposition: A | Discharge status: Home / Self Care | Payer: Self-pay | Source: Home / Self Care | Attending: Gastroenterology

## 2023-10-21 DIAGNOSIS — I4891 Unspecified atrial fibrillation: Secondary | ICD-10-CM | POA: Diagnosis not present

## 2023-10-21 DIAGNOSIS — I2510 Atherosclerotic heart disease of native coronary artery without angina pectoris: Secondary | ICD-10-CM | POA: Diagnosis not present

## 2023-10-21 DIAGNOSIS — Z1211 Encounter for screening for malignant neoplasm of colon: Secondary | ICD-10-CM | POA: Insufficient documentation

## 2023-10-21 DIAGNOSIS — Z91199 Patient's noncompliance with other medical treatment and regimen due to unspecified reason: Secondary | ICD-10-CM | POA: Diagnosis not present

## 2023-10-21 DIAGNOSIS — Z87891 Personal history of nicotine dependence: Secondary | ICD-10-CM | POA: Diagnosis not present

## 2023-10-21 HISTORY — PX: COLONOSCOPY: SHX5424

## 2023-10-21 SURGERY — COLONOSCOPY
Anesthesia: General

## 2023-10-21 MED ORDER — SODIUM CHLORIDE 0.9 % IV SOLN: INTRAVENOUS | Status: DC

## 2023-10-21 MED ORDER — PROPOFOL 10 MG/ML IV BOLUS
INTRAVENOUS | Status: DC | PRN
  Administered 2023-10-21: 70 mg via INTRAVENOUS
  Administered 2023-10-21: 20 mg via INTRAVENOUS

## 2023-10-21 MED ORDER — PROPOFOL 500 MG/50ML IV EMUL
INTRAVENOUS | Status: DC | PRN
  Administered 2023-10-21: 140 ug/kg/min via INTRAVENOUS

## 2023-10-21 NOTE — Anesthesia Preprocedure Evaluation (Signed)
 Anesthesia Evaluation  Patient identified by MRN, date of birth, ID band Patient awake    Reviewed: Allergy & Precautions, NPO status , Patient's Chart, lab work & pertinent test results  History of Anesthesia Complications (+) PONV and history of anesthetic complications  Airway Mallampati: III  TM Distance: >3 FB Neck ROM: Full    Dental no notable dental hx. (+) Teeth Intact   Pulmonary shortness of breath and with exertion, neg sleep apnea, neg COPD, Patient abstained from smoking.Not current smoker, former smoker   Pulmonary exam normal breath sounds clear to auscultation       Cardiovascular Exercise Tolerance: Good METS(-) hypertension+ CAD  (-) Past MI negative cardio ROS + dysrhythmias Atrial Fibrillation  Rhythm:Irregular Rate:Normal - Systolic murmurs    Neuro/Psych  PSYCHIATRIC DISORDERS  Depression    negative neurological ROS     GI/Hepatic ,neg GERD  ,,(+)     (-) substance abuse    Endo/Other  neg diabetes    Renal/GU negative Renal ROS     Musculoskeletal   Abdominal  (+) + obese  Peds  Hematology   Anesthesia Other Findings Past Medical History: No date: Allergic rhinitis No date: Aortic atherosclerosis (HCC) No date: Arthritis 12/16/2020: Atrial fibrillation (HCC)     Comment:  a.) CHA2DS2VASc = 2 (age, vascular disease history);                b.) rate/rhythm maintained on oral metoprolol  succinate;               has refused DCCV and chronic oral anticoagulation No date: Cataract No date: Coronary artery calcification seen on CT scan No date: Depression No date: Diverticulosis No date: Dyspnea No date: HLD (hyperlipidemia) No date: PONV (postoperative nausea and vomiting) 02/04/2018: Urothelial carcinoma of bladder (HCC)     Comment:  a.) s/p TURBT 02/04/2018: pathology (+) high grade               non-invasive urothelial carcinoma (no muscularis propria               invasion); b.)  s/p TURBT 10/13/2019 --> pathology (+):               low grade non-invasive urothelial carcinoma (no               muscularis propria invasion); c.) s/p TURBT 12/27/2020:               pathology (+) low grade non-invasive urothelial carcinoma              (no muscularis propria invasion)  Reproductive/Obstetrics                             Anesthesia Physical Anesthesia Plan  ASA: 3  Anesthesia Plan: General   Post-op Pain Management: Minimal or no pain anticipated   Induction: Intravenous  PONV Risk Score and Plan: 3 and Propofol  infusion, TIVA and Ondansetron   Airway Management Planned: Nasal Cannula  Additional Equipment: None  Intra-op Plan:   Post-operative Plan:   Informed Consent: I have reviewed the patients History and Physical, chart, labs and discussed the procedure including the risks, benefits and alternatives for the proposed anesthesia with the patient or authorized representative who has indicated his/her understanding and acceptance.     Dental advisory given  Plan Discussed with: CRNA and Surgeon  Anesthesia Plan Comments: (Discussed risks of anesthesia with patient, including possibility of difficulty with  spontaneous ventilation under anesthesia necessitating airway intervention, PONV, and rare risks such as cardiac or respiratory or neurological events, and allergic reactions. Discussed the role of CRNA in patient's perioperative care. Patient understands.)       Anesthesia Quick Evaluation

## 2023-10-21 NOTE — Op Note (Signed)
 [facility]astroenterology Patient Name: Francis Jones Procedure Date: 10/21/2023 7:14 AM MRN: [id number] Account #: [id number] Date of Birth: 1951-03-29 Admit Type: Outpatient Age: 73 Room: [facility] ENDO ROOM 1 Gender: Male Note Status: Finalized Instrument Name: Hyman Main [id number] Procedure:             Colonoscopy Indications:           Screening for colorectal malignant neoplasm Providers:             Luke Salaam MD, MD Referring MD:          Carlean Charter (Referring MD) Medicines:             Monitored Anesthesia Care Complications:         No immediate complications. Procedure:             Pre-Anesthesia Assessment:                        - Prior to the procedure, a History and Physical was                         performed, and patient medications, allergies and                         sensitivities were reviewed. The patient's tolerance                         of previous anesthesia was reviewed.                        - The risks and benefits of the procedure and the                         sedation options and risks were discussed with the                         patient. All questions were answered and informed                         consent was obtained.                        - ASA Grade Assessment: II - A patient with mild                         systemic disease.                        After obtaining informed consent, the colonoscope was                         passed under direct vision. Throughout the procedure,                         the patient's blood pressure, pulse, and oxygen                         saturations were monitored continuously. The                         Colonoscope was introduced  through the anus with the                         intention of advancing to the cecum. The scope was                         advanced to the sigmoid colon before the procedure was                         aborted. Medications were given. The  colonoscopy was                         performed without difficulty. The patient tolerated                         the procedure well. The quality of the bowel                         preparation was inadequate. Findings:      A large amount of solid stool was found in the recto-sigmoid colon,       interfering with visualization. Impression:            - Preparation of the colon was inadequate.                        - Stool in the recto-sigmoid colon.                        - No specimens collected. Recommendation:        - Discharge patient to home (with escort).                        - Resume previous diet.                        - Continue present medications.                        - Repeat colonoscopy in 4 weeks because the bowel                         preparation was suboptimal. Procedure Code(s):     --- Professional ---                        [id number], 53, Colonoscopy, flexible; diagnostic,                         including collection of specimen(s) by brushing or                         washing, when performed (separate procedure) Diagnosis Code(s):     --- Professional ---                        Z12.11, Encounter for screening for malignant neoplasm                         of colon CPT copyright 2022 American Medical Association. All rights reserved. The codes documented in this report are  preliminary and upon coder review may  be revised to meet current compliance requirements. Luke Salaam, MD Luke Salaam MD, MD 10/21/2023 7:54:54 AM This report has been signed electronically. Number of Addenda: 0 Note Initiated On: 10/21/2023 7:14 AM Total Procedure Duration: 0 hours 2 minutes 33 seconds  Estimated Blood Loss:  Estimated blood loss: none.      [facility]

## 2023-10-21 NOTE — H&P (Signed)
 Luke Salaam, MD [address], [address], [place], [place], [address] [address], [address], [place], [place], [address] Phone: [phone]  Fax: [phone]  Primary Care Physician:  Carlean Charter, DO   Pre-Procedure History & Physical: HPI:  Francis Jones is a 73 y.o. male is here for an colonoscopy.   Past Medical History:  Diagnosis Date   Allergic rhinitis    Aortic atherosclerosis (HCC)    Arthritis    Atrial fibrillation (HCC) 12/16/2020   a.) CHA2DS2VASc = 2 (age, vascular disease history);  b.) rate/rhythm maintained on oral metoprolol  succinate; has refused DCCV and chronic oral anticoagulation   Cataract    Coronary artery calcification seen on CT scan    Depression    Diverticulosis    Dyspnea    HLD (hyperlipidemia)    PONV (postoperative nausea and vomiting)    Urothelial carcinoma of bladder (HCC) 02/04/2018   a.) s/p TURBT 02/04/2018: pathology (+) high grade non-invasive urothelial carcinoma (no muscularis propria invasion); b.) s/p TURBT 10/13/2019 --> pathology (+): low grade non-invasive urothelial carcinoma (no muscularis propria invasion); c.) s/p TURBT 12/27/2020: pathology (+) low grade non-invasive urothelial carcinoma (no muscularis propria invasion)    Past Surgical History:  Procedure Laterality Date   APPENDECTOMY  1973   BLADDER INSTILLATION N/A 12/04/2022   Procedure: BLADDER INSTILLATION OF GEMCITABINE ;  Surgeon: Geraline Knapp, MD;  Location: [facility] ORS;  Service: Urology;  Laterality: N/A;   CYSTOSCOPY WITH BIOPSY N/A 12/27/2020   Procedure: CYSTOSCOPY WITH BLADDER BIOPSY;  Surgeon: Geraline Knapp, MD;  Location: [facility] ORS;  Service: Urology;  Laterality: N/A;   CYSTOSCOPY WITH FULGERATION N/A 12/27/2020   Procedure: CYSTOSCOPY WITH FULGERATION;  Surgeon: Geraline Knapp, MD;  Location: [facility] OR[facility];  Service: Urology;  Laterality: N/A;   CYSTOSCOPY WITH STENT PLACEMENT Right 02/04/2018   Procedure: CYSTOSCOPY;  Surgeon: Geraline Knapp, MD;  Location: [facility] OR[facility];  Service: Urology;  Laterality: Right;   FINGER SURGERY Left 2001   titanium in left 5th finger   FRACTURE SURGERY     HERNIA REPAIR  2000   SHOULDER SURGERY Left 2009   TRANSURETHRAL RESECTION OF BLADDER TUMOR N/A 02/04/2018   Procedure: TRANSURETHRAL RESECTION OF BLADDER TUMOR (TURBT) with gemcitabine ;  Surgeon: Geraline Knapp, MD;  Location: [facility] OR[facility];  Service: Urology;  Laterality: N/A;   TRANSURETHRAL RESECTION OF BLADDER TUMOR N/A 10/13/2019   Procedure: TRANSURETHRAL RESECTION OF BLADDER TUMOR (TURBT) with gemcitabine ;  Surgeon: Geraline Knapp, MD;  Location: [facility] OR[facility];  Service: Urology;  Laterality: N/A;   TRANSURETHRAL RESECTION OF BLADDER TUMOR N/A 12/04/2022   Procedure: TRANSURETHRAL RESECTION OF BLADDER TUMOR (TURBT);  Surgeon: Geraline Knapp, MD;  Location: [facility] OR[facility];  Service: Urology;  Laterality: N/A;   VASECTOMY  1987    Prior to Admission medications   Medication Sig Start Date End Date Taking? Authorizing Provider  aspirin  EC 81 MG tablet Take 81 mg by mouth daily. Swallow whole.   Yes [provider]  metoprolol  succinate (TOPROL  XL) 25 MG 24 hr tablet Take 1 tablet (25 mg total) by mouth daily. 07/22/23  Yes Constancia Delton, MD  tadalafil  (CIALIS ) 20 MG tablet 1 tab 1 hour prior to intercourse 09/18/23   Geraline Knapp, MD    Allergies as of 10/17/2023 - Review Complete 10/01/2023  Allergen Reaction Noted   Hydrocodone-acetaminophen  Nausea And Vomiting 12/30/2015   Oxycodone  Nausea And Vomiting 10/06/2013    Family History  Problem  Relation Age of Onset   Alzheimer's disease Father     Social History   Socioeconomic History   Marital status: Widowed    Spouse name: Dayla Eva   Number of children: Not on file   Years of education: Not on file   Highest education level: Doctorate  Occupational History   Occupation: retired Teacher, English as a foreign language  Tobacco Use   Smoking status: Former    Current  packs/day: 0.00    Types: Cigarettes    Quit date: 1979    Years since quitting: 46.3   Smokeless tobacco: Never  Vaping Use   Vaping status: Never Used  Substance and Sexual Activity   Alcohol use: Yes    Alcohol/week: 5.0 standard drinks of alcohol    Types: 3 Glasses of wine, 1 Cans of beer, 1 Shots of liquor per week    Comment: occasional   Drug use: Not Currently    Types: LSD, Opium    Comment: as a teenager   Sexual activity: Yes    Birth control/protection: None  Other Topics Concern   Not on file  Social History Narrative   Pt lives with wife; retd- Stage manager- Nurse, children's. 5 years smoker- remote.    Social Drivers of Corporate investment banker Strain: Low Risk  (09/10/2023)   Overall Financial Resource Strain (CARDIA)    Difficulty of Paying Living Expenses: Not hard at all  Food Insecurity: No Food Insecurity (09/10/2023)   Hunger Vital Sign    Worried About Running Out of Food in the Last Year: Never true    Ran Out of Food in the Last Year: Never true  Transportation Needs: No Transportation Needs (09/10/2023)   PRAPARE - Administrator, Civil Service (Medical): No    Lack of Transportation (Non-Medical): No  Physical Activity: Sufficiently Active (09/10/2023)   Exercise Vital Sign    Days of Exercise per Week: 4 days    Minutes of Exercise per Session: 50 min  Stress: Stress Concern Present (09/10/2023)   [employer] of Occupational Health - Occupational Stress Questionnaire    Feeling of Stress : Very much  Social Connections: Moderately Integrated (09/10/2023)   Social Connection and Isolation Panel [NHANES]    Frequency of Communication with Friends and Family: Three times a week    Frequency of Social Gatherings with Friends and Family: Once a week    Attends Religious Services: More than 4 times per year    Active Member of [employer] or Organizations: Yes    Attends Banker Meetings: More than 4 times per year    Marital Status: Widowed   Catering manager Violence: Not on file    Review of Systems: See HPI, otherwise negative ROS  Physical Exam: BP (!) 151/101   Pulse 88   Temp 98.8 F (37.1 C) (Temporal)   Resp 18   Ht 6' (1.829 m)   Wt 122.9 kg   SpO2 96%   BMI 36.75 kg/m  General:   Alert,  pleasant and cooperative in NAD Head:  Normocephalic and atraumatic. Neck:  Supple; no masses or thyromegaly. Lungs:  Clear throughout to auscultation, normal respiratory effort.    Heart:  +S1, +S2, Regular rate and rhythm, No edema. Abdomen:  Soft, nontender and nondistended. Normal bowel sounds, without guarding, and without rebound.   Neurologic:  Alert and  oriented x4;  grossly normal neurologically.  Impression/Plan: Francis Jones is here for an colonoscopy to be performed for Screening colonoscopy  average risk   Risks, benefits, limitations, and alternatives regarding  colonoscopy have been reviewed with the patient.  Questions have been answered.  All parties agreeable.   Luke Salaam, MD  10/21/2023, 7:42 AM

## 2023-10-21 NOTE — Transfer of Care (Signed)
 Immediate Anesthesia Transfer of Care Note  Patient: Francis Jones  Procedure(s) Performed: COLONOSCOPY  Patient Location: PACU  Anesthesia Type:General  Level of Consciousness: awake, alert , and oriented  Airway & Oxygen Therapy: Patient Spontanous Breathing  Post-op Assessment: Report given to RN and Post -op Vital signs reviewed and stable  Post vital signs: Reviewed and stable  Last Vitals:  Vitals Value Taken Time  BP    Temp    Pulse    Resp    SpO2      Last Pain:  Vitals:   10/21/23 0704  TempSrc: Temporal  PainSc: 0-No pain         Complications: No notable events documented.

## 2023-10-21 NOTE — Anesthesia Postprocedure Evaluation (Signed)
 Anesthesia Post Note  Patient: Francis Jones  Procedure(s) Performed: COLONOSCOPY  Patient location during evaluation: Endoscopy Anesthesia Type: General Level of consciousness: awake and alert Pain management: pain level controlled Vital Signs Assessment: post-procedure vital signs reviewed and stable Respiratory status: spontaneous breathing, nonlabored ventilation, respiratory function stable and patient connected to nasal cannula oxygen Cardiovascular status: blood pressure returned to baseline and stable Postop Assessment: no apparent nausea or vomiting Anesthetic complications: no   No notable events documented.   Last Vitals:  Vitals:   10/21/23 0757 10/21/23 0807  BP: 102/62 107/73  Pulse: 83 82  Resp: 19 17  Temp: 37.1 C   SpO2: 95% 95%    Last Pain:  Vitals:   10/21/23 0807  TempSrc:   PainSc: 0-No pain                 Lattie Poli

## 2023-10-22 ENCOUNTER — Telehealth: Payer: Self-pay

## 2023-10-22 NOTE — Telephone Encounter (Signed)
 Call returned to patient to schedule repeat colonoscopy 2 day prep- Dr. Antony Baumgartner recommended repeat in 4 weeks which will be around 11/21/23.  Can schedule with Dr. Ole Berkeley on this date and will plan to do Dr. Ludger Sacramento 2 day prep with 64 oz of gatorade and 238g of Miralax 2 days before.  LVM for him to call me back to schedule.  Thanks, [place], [place]

## 2023-10-25 ENCOUNTER — Telehealth: Payer: Self-pay

## 2023-10-25 ENCOUNTER — Other Ambulatory Visit: Payer: Self-pay

## 2023-10-25 DIAGNOSIS — Z1211 Encounter for screening for malignant neoplasm of colon: Secondary | ICD-10-CM

## 2023-10-25 NOTE — Telephone Encounter (Signed)
 Per Dr.Anna would like to repeat Colonoscopy due to poor bowel prep- recent colonoscopy 10/21/23-left message for patient to return call so we can schedule.

## 2023-10-26 ENCOUNTER — Other Ambulatory Visit: Payer: Self-pay

## 2023-10-28 NOTE — Telephone Encounter (Signed)
 Error

## 2023-10-30 ENCOUNTER — Ambulatory Visit (INDEPENDENT_AMBULATORY_CARE_PROVIDER_SITE_OTHER): Admitting: Urology

## 2023-10-30 VITALS — BP 121/82 | HR 78 | Ht 72.0 in | Wt 276.0 lb

## 2023-10-30 DIAGNOSIS — N329 Bladder disorder, unspecified: Secondary | ICD-10-CM

## 2023-10-30 DIAGNOSIS — Z8551 Personal history of malignant neoplasm of bladder: Secondary | ICD-10-CM

## 2023-10-30 MED ORDER — CEPHALEXIN 500 MG PO CAPS
500.0000 mg | ORAL_CAPSULE | Freq: Once | ORAL | Status: AC
  Administered 2023-10-30: 500 mg via ORAL

## 2023-10-30 NOTE — Progress Notes (Signed)
   10/30/23  Urologic history 1. Low grade Ta urethral carcinoma of the bladder TURBT August 2019 papillary bladder tumor High-grade urothelial carcinoma Ta Induction BCG completed November 2019 TURBT 10/13/2019 10 mm papillary lesion right bladder neck, received post resection gemcitabine  Pathology Ta urothelial carcinoma (low-grade) TURBT 12/2020 for 5 mm recurrent tumor posterior wall Pathology Ta low-grade urothelial carcinoma 6-week course of intravesical gemcitabine  in oncology TURBT for 1 cm recurrent bladder tumor 12/04/22   HPI: 72 y.o. male with a history of low-grade urothelial carcinoma noted on cystoscopy last month to have an area papillary change at the anterior bladder neck.  He presents for biopsy/fulguration  See rooming tab for vitals NED. A&Ox3.   No respiratory distress   Abd soft, NT, ND Normal phallus with bilateral descended testicles  Cystoscopy Procedure Note  Patient identification was confirmed, informed consent was obtained, and patient was prepped using Betadine solution.  Lidocaine  jelly was administered per urethral meatus.     Pre-Procedure: - Inspection reveals a normal caliber urethral meatus.  Procedure: The flexible cystoscope was introduced without difficulty  The area was easily visualized and had not changed.  Based on median lobe tissue and the anterior location unable to maneuver biopsy forceps to this anterior location The lesion however was completely fulgurated   Post-Procedure: - Patient tolerated the procedure well  Assessment/ Plan: Status post fulguration of bladder neck lesion Follow-up surveillance cystoscopy 6 months   Geraline Knapp, MD

## 2023-10-31 ENCOUNTER — Other Ambulatory Visit: Payer: Self-pay

## 2023-11-28 ENCOUNTER — Encounter: Payer: Self-pay | Admitting: Family Medicine

## 2023-11-28 ENCOUNTER — Ambulatory Visit (INDEPENDENT_AMBULATORY_CARE_PROVIDER_SITE_OTHER): Admitting: Family Medicine

## 2023-11-28 VITALS — BP 119/90 | HR 95 | Resp 16 | Ht 72.0 in | Wt 274.9 lb

## 2023-11-28 DIAGNOSIS — M545 Low back pain, unspecified: Secondary | ICD-10-CM

## 2023-11-28 DIAGNOSIS — Z634 Disappearance and death of family member: Secondary | ICD-10-CM

## 2023-11-28 DIAGNOSIS — Z0001 Encounter for general adult medical examination with abnormal findings: Secondary | ICD-10-CM | POA: Diagnosis not present

## 2023-11-28 DIAGNOSIS — Z0000 Encounter for general adult medical examination without abnormal findings: Secondary | ICD-10-CM

## 2023-11-28 DIAGNOSIS — C678 Malignant neoplasm of overlapping sites of bladder: Secondary | ICD-10-CM

## 2023-11-28 DIAGNOSIS — N529 Male erectile dysfunction, unspecified: Secondary | ICD-10-CM

## 2023-11-28 DIAGNOSIS — M542 Cervicalgia: Secondary | ICD-10-CM | POA: Diagnosis not present

## 2023-11-28 DIAGNOSIS — G8929 Other chronic pain: Secondary | ICD-10-CM

## 2023-11-28 DIAGNOSIS — L729 Follicular cyst of the skin and subcutaneous tissue, unspecified: Secondary | ICD-10-CM

## 2023-11-28 NOTE — Progress Notes (Signed)
 Annual Wellness Visit     Patient: Francis Jones, Male    DOB: [birth year], 73 y.o.   MRN: [id number] Visit Date: 11/28/2023  Today's Provider: LAURAINE LOISE BUOY, DO   Chief Complaint  Patient presents with   Medicare Wellness   Subjective    [name] is a 73 y.o. male who presents today for his Annual Wellness Visit. He reports consuming a general diet. He does not exercise regularly. He generally feels well. He reports sleeping well. He does have additional problems to discuss today.   HPI Francis Jones is a 73 year old male who presents for a follow-up visit regarding a scrotal cyst and other health concerns.  He has a cyst on his scrotum that occasionally causes mild pain, which is a concern for him. He has a history of bladder cancer and undergoes cystoscopy every six months for surveillance. His urologist manages this condition and has previously removed a cyst.  He experiences erectile dysfunction and has discussed this with his urologist, who prescribed tadalafil . He has not yet assessed its effectiveness.  He describes symptoms of neuropathy but clarifies that he does not have necropathy. He is on metoprolol  for blood pressure management and does not take any medication for cholesterol.  He recently lost his wife approximately four months ago and faces emotional and physical challenges, including a phenomenon he refers to as 'widow's fire,' which involves an overwhelming sexual urge. He participates in online support groups for widows and widowers and is exploring new social connections.  He has not been exercising as much as he would like and wants to increase his physical activity. He is interested in programs like Geophysicist/field seismologist for seniors.  He has not yet received the latest COVID booster, shingles, or pneumonia vaccines but plans to arrange these soon.      Medications: Outpatient Medications Prior to Visit  Medication Sig   aspirin  EC 81 MG  tablet Take 81 mg by mouth daily. Swallow whole.   metoprolol  succinate (TOPROL  XL) 25 MG 24 hr tablet Take 1 tablet (25 mg total) by mouth daily.   [DISCONTINUED] tadalafil  (CIALIS ) 20 MG tablet 1 tab 1 hour prior to intercourse   No facility-administered medications prior to visit.    Allergies  Allergen Reactions   Hydrocodone-Acetaminophen  Nausea And Vomiting   Oxycodone  Nausea And Vomiting    Patient Care Team: BUOY LAURAINE LOISE, DO as PCP - General (Family Medicine) Darliss Rogue, MD as PCP - Cardiology (Cardiology) Cindie Ole DASEN, MD as PCP - Electrophysiology (Cardiology) Dasher, Alm LABOR, MD (Dermatology)        Objective    Vitals: BP (!) 119/90 (BP Location: Left Arm, Patient Position: Sitting, Cuff Size: Large)   Pulse 95   Resp 16   Ht 6' (1.829 m)   Wt 274 lb 14.4 oz (124.7 kg)   SpO2 97%   BMI 37.28 kg/m     Physical Exam Vitals and nursing note reviewed.  Constitutional:      General: He is awake.     Appearance: Normal appearance.  HENT:     Head: Normocephalic and atraumatic.     Right Ear: Tympanic membrane, ear canal and external ear normal.     Left Ear: Tympanic membrane, ear canal and external ear normal.     Nose: Nose normal.     Mouth/Throat:     Mouth: Mucous membranes are moist.     Pharynx: Oropharynx is clear.  No oropharyngeal exudate or posterior oropharyngeal erythema.   Eyes:     General: No scleral icterus.    Extraocular Movements: Extraocular movements intact.     Conjunctiva/sclera: Conjunctivae normal.     Pupils: Pupils are equal, round, and reactive to light.   Neck:     Thyroid : No thyromegaly or thyroid  tenderness.   Cardiovascular:     Rate and Rhythm: Normal rate. Rhythm irregular.     Pulses: Normal pulses.     Heart sounds: Normal heart sounds.  Pulmonary:     Effort: Pulmonary effort is normal. No tachypnea, bradypnea or respiratory distress.     Breath sounds: Normal breath sounds. No stridor. No  wheezing, rhonchi or rales.  Abdominal:     General: Bowel sounds are normal. There is no distension.     Palpations: Abdomen is soft. There is no mass.     Tenderness: There is no abdominal tenderness. There is no guarding.     Hernia: No hernia is present.   Musculoskeletal:     Cervical back: Normal range of motion and neck supple.     Right lower leg: No edema.     Left lower leg: No edema.  Lymphadenopathy:     Cervical: No cervical adenopathy.   Skin:    General: Skin is warm and dry.     Comments: Right scrotal cyst approx. 8 mm circumference of 3 confluent areas; skin color white/bluish and slightly red at parts.   Neurological:     Mental Status: He is alert and oriented to person, place, and time. Mental status is at baseline.   Psychiatric:        Mood and Affect: Mood normal.        Behavior: Behavior normal.     Most recent functional status assessment:    11/28/2023    9:50 AM  In your present state of health, do you have any difficulty performing the following activities:  Hearing? 0  Vision? 1  Comment L eye has a build up.  Difficulty concentrating or making decisions? 0  Walking or climbing stairs? 1  Comment gets sob  Dressing or bathing? 0  Doing errands, shopping? 0  Preparing Food and eating ? Y  Using the Toilet? N  In the past six months, have you accidently leaked urine? N  Do you have problems with loss of bowel control? N  Managing your Medications? N  Managing your Finances? N  Housekeeping or managing your Housekeeping? N   Most recent fall risk assessment:    11/28/2023   10:16 AM  Fall Risk   Falls in the past year? 0  Injury with Fall? 0  Risk for fall due to : No Fall Risks    Most recent depression screenings:    11/28/2023   10:17 AM 09/12/2023    9:19 AM  PHQ 2/9 Scores  PHQ - 2 Score 2 6  PHQ- 9 Score 8 18  Exception Documentation --    Most recent cognitive screening:    11/28/2023    9:54 AM  6CIT Screen  What  Year? 0 points  What month? 0 points  What time? 0 points  Count back from 20 0 points  Months in reverse 0 points  Repeat phrase 2 points  Total Score 2 points   Most recent Audit-C alcohol use screening    11/24/2023    8:56 AM  Alcohol Use Disorder Test (AUDIT)  1. How often do you have  a drink containing alcohol? 3  2. How many drinks containing alcohol do you have on a typical day when you are drinking? 0  3. How often do you have six or more drinks on one occasion? 0  AUDIT-C Score 3   4. How often during the last year have you found that you were not able to stop drinking once you had started? 0  5. How often during the last year have you failed to do what was normally expected from you because of drinking? 0  6. How often during the last year have you needed a first drink in the morning to get yourself going after a heavy drinking session? 0  7. How often during the last year have you had a feeling of guilt of remorse after drinking? 0  8. How often during the last year have you been unable to remember what happened the night before because you had been drinking? 0  9. Have you or someone else been injured as a result of your drinking? 0  10. Has a relative or friend or a doctor or another health worker been concerned about your drinking or suggested you cut down? 0  Alcohol Use Disorder Identification Test Final Score (AUDIT) 3      Patient-reported   A score of 3 or more in women, and 4 or more in men indicates increased risk for alcohol abuse, EXCEPT if all of the points are from question 1   No results found for any visits on 11/28/23.  Assessment & Plan     Annual wellness visit done today including the all of the following: Reviewed patient's Family Medical History Reviewed and updated list of patient's medical providers Assessment of cognitive impairment was done Assessed patient's functional ability Established a written schedule for health screening  services Health Risk Assessent Completed and Reviewed Patient is not currently on any opioid medications.   Exercise Activities and Dietary recommendations  Goals   None     Immunization History  Administered Date(s) Administered   Influenza, High Dose Seasonal PF 05/01/2016, 03/19/2022   Influenza,inj,Quad PF,6+ Mos 03/25/2019   Influenza-Unspecified 02/19/2017, 04/03/2018, 02/24/2020, 03/19/2022, 03/04/2023   Moderna Covid-19 Fall Seasonal Vaccine 2yrs & older 12/14/2022   PFIZER Comirnaty(Gray Top)Covid-19 Tri-Sucrose Vaccine 10/05/2020   PFIZER(Purple Top)SARS-COV-2 Vaccination 07/24/2019, 08/19/2019, 03/10/2020   Pfizer Covid-19 Vaccine Bivalent Booster 57yrs & up 02/24/2021, 11/16/2021, 03/04/2023   Pfizer(Comirnaty)Fall Seasonal Vaccine 12 years and older 03/19/2022   Pneumococcal Conjugate-13 05/01/2016   Respiratory Syncytial Virus Vaccine,Recomb Aduvanted(Arexvy) 05/29/2022   Td 04/20/2004, 12/30/2015   Tdap 12/30/2015    Health Maintenance  Topic Date Due   Zoster Vaccines- Shingrix (1 of 2) Never done   Pneumococcal Vaccine: 50+ Years (2 of 2 - PCV20 or PCV21) 05/01/2017   COVID-19 Vaccine (10 - 2024-25 season) 04/29/2023   INFLUENZA VACCINE  01/10/2024   Medicare Annual Wellness (AWV)  11/27/2024   DTaP/Tdap/Td (4 - Td or Tdap) 12/29/2025   Colonoscopy  10/20/2033   Hepatitis C Screening  Completed   Hepatitis B Vaccines  Aged Out   HPV VACCINES  Aged Out   Meningococcal B Vaccine  Aged Out     Discussed health benefits of physical activity, and encouraged him to engage in regular exercise appropriate for his age and condition.    Medicare annual wellness visit, subsequent  Bereavement  Erectile dysfunction, unspecified erectile dysfunction type  Scrotal cyst  Neck pain -     Ambulatory referral to Physical  Therapy  Chronic low back pain, unspecified back pain laterality, unspecified whether sciatica present -     Ambulatory referral to Physical  Therapy  Cancer of overlapping sites of bladder [facility]) Assessment & Plan: Currently following with Dr. Twylla with regular surveillance.       Medicare annual wellness visit Due for COVID booster, shingles, and pneumonia vaccinations. Interested in increasing physical activity. Referred to physical therapy for back and posture issues. - Arrange COVID booster, shingles, and pneumonia vaccinations. - Encourage participation in [employer] or similar exercise program. - Follow up with physical therapy for back and posture issues.  Grief and Bereavement Engaging in counseling, massage therapy, and online support groups. Exploring new relationships, which aligns with bereavement counseling recommendations. - Continue counseling and support group participation. - Encourage exploration of new relationships for emotional healing.  Erectile Dysfunction Has tadalafil  prescription from urologist (Dr. Twylla). Exploring pelvic floor exercises. Dismissed kegel weights as ineffective. - Continue tadalafil  as prescribed by urologist. - Encourage pelvic floor exercises.  Scrotal Cyst Reports occasional mild discomfort. Previously discussed possibility of cyst removal with dermatologist but has not yet pursued. - Consider follow-up with dermatologist if symptoms worsen. - Defer to specialist management.  Bladder Cancer Undergoes regular cystoscopies for surveillance under urologist care. - Continue regular cystoscopy every six months.     Return in about 4 weeks (around 12/26/2023) for Chronic f/u.     I discussed the assessment and treatment plan with the patient  The patient was provided an opportunity to ask questions and all were answered. The patient agreed with the plan and demonstrated an understanding of the instructions.   The patient was advised to call back or seek an in-person evaluation if the symptoms worsen or if the condition fails to improve as  anticipated.    LAURAINE LOISE BUOY, DO  [facility] [phone] (phone) [phone] (fax)  [facility]

## 2023-11-28 NOTE — Patient Instructions (Addendum)
 Recommended vaccines: Prevnar-20 or -21 (pneumonia), Covid booster, and Shingrix (shingles).  Can do Kegel exercises to rebuild pelvic floor strengthening.

## 2023-11-28 NOTE — Assessment & Plan Note (Signed)
 Currently following with Dr. Cherylene Corrente with regular surveillance.

## 2023-11-30 DIAGNOSIS — Z23 Encounter for immunization: Secondary | ICD-10-CM | POA: Diagnosis not present

## 2023-12-02 ENCOUNTER — Other Ambulatory Visit: Payer: Self-pay | Admitting: Urology

## 2023-12-02 ENCOUNTER — Encounter: Payer: Self-pay | Admitting: Urology

## 2023-12-04 ENCOUNTER — Ambulatory Visit: Attending: Family Medicine

## 2023-12-04 DIAGNOSIS — M5459 Other low back pain: Secondary | ICD-10-CM | POA: Diagnosis not present

## 2023-12-04 DIAGNOSIS — M545 Low back pain, unspecified: Secondary | ICD-10-CM | POA: Insufficient documentation

## 2023-12-04 DIAGNOSIS — M542 Cervicalgia: Secondary | ICD-10-CM | POA: Diagnosis not present

## 2023-12-04 DIAGNOSIS — G8929 Other chronic pain: Secondary | ICD-10-CM | POA: Insufficient documentation

## 2023-12-04 NOTE — Therapy (Signed)
 OUTPATIENT PHYSICAL THERAPY THORACOLUMBAR EVALUATION   Patient Name: Francis Jones MRN: [id number] DOB:July 28, 1950, 73 y.o., male Today's Date: 12/04/2023  END OF SESSION:  PT End of Session - 12/04/23 0953     Visit Number 1    Number of Visits 17    Date for PT Re-Evaluation 01/31/24    PT Start Time 0953    PT Stop Time 1033    PT Time Calculation (min) 40 min    Activity Tolerance Patient tolerated treatment well    Behavior During Therapy [facility] for tasks assessed/performed          Past Medical History:  Diagnosis Date   Allergic rhinitis    Aortic atherosclerosis (HCC)    Arthritis    Atrial fibrillation (HCC) 12/16/2020   a.) CHA2DS2VASc = 2 (age, vascular disease history);  b.) rate/rhythm maintained on oral metoprolol  succinate; has refused DCCV and chronic oral anticoagulation   Cataract    Coronary artery calcification seen on CT scan    Depression    Diverticulosis    Dyspnea    HLD (hyperlipidemia)    PONV (postoperative nausea and vomiting)    Urothelial carcinoma of bladder (HCC) 02/04/2018   a.) s/p TURBT 02/04/2018: pathology (+) high grade non-invasive urothelial carcinoma (no muscularis propria invasion); b.) s/p TURBT 10/13/2019 --> pathology (+): low grade non-invasive urothelial carcinoma (no muscularis propria invasion); c.) s/p TURBT 12/27/2020: pathology (+) low grade non-invasive urothelial carcinoma (no muscularis propria invasion)   Past Surgical History:  Procedure Laterality Date   APPENDECTOMY  1973   BLADDER INSTILLATION N/A 12/04/2022   Procedure: BLADDER INSTILLATION OF GEMCITABINE ;  Surgeon: Twylla Glendia BROCKS, MD;  Location: [facility];  Service: Urology;  Laterality: N/A;   COLONOSCOPY N/A 10/21/2023   Procedure: COLONOSCOPY;  Surgeon: Therisa Bi, MD;  Location: [facility];  Service: Gastroenterology;  Laterality: N/A;   CYSTOSCOPY WITH BIOPSY N/A 12/27/2020   Procedure: CYSTOSCOPY WITH BLADDER BIOPSY;  Surgeon: Twylla Glendia BROCKS, MD;   Location: [facility];  Service: Urology;  Laterality: N/A;   CYSTOSCOPY WITH FULGERATION N/A 12/27/2020   Procedure: CYSTOSCOPY WITH FULGERATION;  Surgeon: Twylla Glendia BROCKS, MD;  Location: [facility];  Service: Urology;  Laterality: N/A;   CYSTOSCOPY WITH STENT PLACEMENT Right 02/04/2018   Procedure: CYSTOSCOPY;  Surgeon: Twylla Glendia BROCKS, MD;  Location: [facility];  Service: Urology;  Laterality: Right;   FINGER SURGERY Left 2001   titanium in left 5th finger   FRACTURE SURGERY     HERNIA REPAIR  2000   SHOULDER SURGERY Left 2009   TRANSURETHRAL RESECTION OF BLADDER TUMOR N/A 02/04/2018   Procedure: TRANSURETHRAL RESECTION OF BLADDER TUMOR (TURBT) with gemcitabine ;  Surgeon: Twylla Glendia BROCKS, MD;  Location: [facility];  Service: Urology;  Laterality: N/A;   TRANSURETHRAL RESECTION OF BLADDER TUMOR N/A 10/13/2019   Procedure: TRANSURETHRAL RESECTION OF BLADDER TUMOR (TURBT) with gemcitabine ;  Surgeon: Twylla Glendia BROCKS, MD;  Location: [facility];  Service: Urology;  Laterality: N/A;   TRANSURETHRAL RESECTION OF BLADDER TUMOR N/A 12/04/2022   Procedure: TRANSURETHRAL RESECTION OF BLADDER TUMOR (TURBT);  Surgeon: Twylla Glendia BROCKS, MD;  Location: [facility];  Service: Urology;  Laterality: N/A;   VASECTOMY  1987   Patient Active Problem List   Diagnosis Date Noted   Aortic atherosclerosis (HCC) 09/24/2023   Permanent atrial fibrillation (HCC) 09/12/2023   Obesity, morbid (HCC) 09/12/2023   Peripheral neuropathy 09/12/2023   Cancer of overlapping sites of bladder (HCC) 01/11/2021   Allergic rhinitis 12/30/2015  HLD (hyperlipidemia) 12/30/2015    PCP: Donzella Lauraine SAILOR, DO  REFERRING PROVIDER: Donzella Lauraine SAILOR, DO  REFERRING DIAG: M54.2 (ICD-10-CM) - Neck pain M54.50,G89.29 (ICD-10-CM) - Chronic low back pain, unspecified back pain laterality, unspecified whether sciatica present  Rationale for Evaluation and Treatment: Rehabilitation  THERAPY DIAG:  Cervicalgia - Plan: PT plan of care  cert/re-cert  Other low back pain - Plan: PT plan of care cert/re-cert  ONSET DATE: Neck: 2-3 years ago; back: 10 years ago  SUBJECTIVE:                                                                                                                                                                                           SUBJECTIVE STATEMENT: Low back: 1/10 currently. 3/10 at most for the past 3 months.   Neck: 0/10 currently, 3/10 at most for the past 3 months.   Leaving July 7 to 20 for a birthday party in [place].    PERTINENT HISTORY:  Low back and neck pain. Just got back from interring his wife's ashes. Emotional currently. Pt was in for a medicare physical recently. Unable to put his head back when he lays in supine. Was then referred to PT. Trying to lift his neck up to drink a can of soda bothers his back because he can't get his neck back far enough. Feels like his back pain is also due to his belly. Back pain has been going on for about 10 years, gradual onset. Has B plantar paresthesia, feels like socks are bunched up on his foot. Has R and L anterior lateral thigh paresthesia (lateral femoral cutaneous nerve) when pt sits for a while.   Neck pain located lower cervical spine and posterior upper cervical spine. Neck pain began about 2-3 years ago, gradual onset. Neck bothers him more currently. More of a concern because he feels like his neck pain is getting worse.   No blood pressure problems Has atrial fibrillation, treated with metroprolol  No latex allergy      PAIN:  Are you having pain? Yes: NPRS scale: 0/10 Pain location: lower and upper cervical spine Pain description: pressure, discomfort Aggravating factors: looking up, R cervical rotation Relieving factors: resting position, cervical protraction.   PRECAUTIONS: no known precautions   RED FLAGS: Bowel or bladder incontinence: No and Cauda equina syndrome: No   WEIGHT BEARING RESTRICTIONS: No  FALLS:   Has patient fallen in last 6 months? 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   NEXT MD VISIT: Early August 2025  OBJECTIVE:  Note: Objective measures were completed at Evaluation unless otherwise noted.  DIAGNOSTIC FINDINGS:    PATIENT SURVEYS:  NDI:  NECK DISABILITY INDEX  Date: 12/04/23 Score  Pain intensity 0 = I have no pain at the moment  2. Personal care (washing, dressing, etc.) 0 = I can look after myself normally without causing extra pain  3. Lifting 0 =  I can lift heavy weights without extra pain  4. Reading 0 = I can read as much as I want to with no pain in my neck  5. Headaches 1 =  I have slight headaches, which come infrequently  6. Concentration 2 = I have a fair degree of difficulty in concentrating when I want to (recently widowed per pt)  7. Work 0 =  I can do as much work as I want to  8. Driving 0 = I can drive my car without any neck pain  9. Sleeping 3 =  My sleep is moderately disturbed (2-3 hrs sleepless)  10. Recreation 0 = I am able to engage in all my recreation activities with no neck pain at all  Total 6/50 (12%)   Minimum Detectable Change (90% confidence): 5 points or 10% points  COGNITION: Overall cognitive status: Within functional limits for tasks assessed     SENSATION:   MUSCLE LENGTH:   POSTURE:  Forward neck, thoracic kyphosis, B protracted shoulders, movement preference around C5/6 and C2/3 area. Backward lean, B genu varus R > L, L low back rotation, R lumbar convexity, R lateral shift   PALPATION: Cervical paraspinal and B upper trap muscle tension   CERVICAL ROM:   Active ROM A/PROM (deg) eval  Flexion WFL  Extension Limited with  reproduction of pain  Right lateral flexion Limited with L lateral neck stretch  Left lateral flexion WFL with R lateral neck stretch    Right rotation 55 degrees (with neck pain with overpressure)  Left rotation 53 degrees (with neck and R upper trap area pain)   (Blank rows = not tested)  UPPER EXTREMITY MMT:  MMT Right eval Left eval  Shoulder flexion 4 4  Shoulder extension    Shoulder abduction    Shoulder adduction    Shoulder extension    Shoulder internal rotation    Shoulder external rotation    Middle trapezius    Lower trapezius (seated manually resisted) 4 4  Elbow flexion 4 4  Elbow extension 5 4  Wrist flexion    Wrist extension 4 4  Wrist ulnar deviation    Wrist radial deviation    Wrist pronation    Wrist supination    Grip strength     (Blank rows = not tested)   LUMBAR ROM:   AROM eval  Flexion   Extension   Right lateral flexion   Left lateral flexion   Right rotation   Left rotation    (Blank rows = not tested)  LOWER EXTREMITY ROM:     Active  Right eval Left eval  Hip flexion    Hip extension    Hip abduction    Hip adduction    Hip internal rotation    Hip external rotation    Knee flexion    Knee extension    Ankle dorsiflexion    Ankle plantarflexion  Ankle inversion    Ankle eversion     (Blank rows = not tested)  LOWER EXTREMITY MMT:    MMT Right eval Left eval  Hip flexion    Hip extension    Hip abduction    Hip adduction    Hip internal rotation    Hip external rotation    Knee flexion    Knee extension    Ankle dorsiflexion    Ankle plantarflexion    Ankle inversion    Ankle eversion     (Blank rows = not tested)  LUMBAR SPECIAL TESTS:    FUNCTIONAL TESTS:    GAIT: Distance walked:  Assistive device utilized:  Level of assistance:  Comments:   TREATMENT DATE: 12/04/2023                                                                                                                                  PATIENT EDUCATION:  Education details: POC Person educated: Patient Education method: Explanation Education comprehension: verbalized understanding  HOME EXERCISE PROGRAM:   ASSESSMENT:  CLINICAL IMPRESSION: Patient is a 73 y.o. male who was seen today for physical therapy evaluation and treatment for neck and low back pain. He also demonstrates altered posture, limited cervical extension, and rotation with reproduction of pain, decreased scapular and anterior cervical strength, B LE paresthesia, and difficulty performing tasks which involve looking around as well as looking up secondary to pain. Pt will benefit from skilled physical therapy services to address the aforementioned deficits.   OBJECTIVE IMPAIRMENTS: decreased ROM, decreased strength, improper body mechanics, postural dysfunction, and pain.   ACTIVITY LIMITATIONS: lifting, standing, reach over head, and locomotion level  PARTICIPATION LIMITATIONS:   PERSONAL FACTORS: Age, Fitness, Time since onset of injury/illness/exacerbation, and 3+ comorbidities: Aortic atherosclerosis, atrial fibrillation, depression,  are also affecting patient's functional outcome.   REHAB POTENTIAL: Fair    CLINICAL DECISION MAKING: Evolving/moderate complexity, neck pain is worse since onset per pt  EVALUATION COMPLEXITY: Moderate   GOALS: Goals reviewed with patient? Yes  SHORT TERM GOALS: Target date: 12/13/2023  Pt will be independent with his initial HEP to improve posture, strength, decrease pain, improved function.  Baseline: Pt has not yet started his initial HEP (12/04/2023) Goal status: INITIAL   LONG TERM GOALS: Target date: 01/31/2024  Pt will have a decrease in neck pain to 1/10 or less at worst to promote ability to look up and around more comfortably  Baseline: 3/10 neck pain at most for the past 3 months (12/04/2023) Goal status: INITIAL  2.  Pt will have a decrease in low back pain to 1/10 or less at worst to  promote ability to perform standing tasks more comfortably.  Baseline: 3/10 at most for the past 3 months (12/04/2023) Goal status: INITIAL  3.  Pt will improve his Neck Disability Index (NDI) to 4% or less as a demonstration of improved function.  Baseline: Neck Disability Index (  NDI) 6/50 (12%) (12/04/2023) Goal status: INITIAL  4.  Pt will improve his B lower trap strength by at least 1/2 MMT grade to promote ability to look around with less neck pain.  Baseline:  MMT Right eval Left eval  Lower trapezius (seated manually resisted) 4 4   Goal status: INITIAL   PLAN:  PT FREQUENCY: 1-2x/week  PT DURATION: 8 weeks  PLANNED INTERVENTIONS: 97110-Therapeutic exercises, 97530- Therapeutic activity, [id number]- Neuromuscular re-education, 97535- Self Care, [id number]- Manual therapy, [phone]- Gait training, [phone]- Aquatic Therapy, [phone]- Electrical stimulation (unattended), [phone]- Traction (mechanical), [id number]- Ionotophoresis 4mg /ml Dexamethasone , Patient/Family education, Joint mobilization, and Spinal mobilization.  PLAN FOR NEXT SESSION: Posture, thoracic extension, scapular and trunk strengthening, manual techniques, modalities PRN   Caston Coopersmith, PT, DPT 12/04/2023, 12:51 PM

## 2023-12-06 ENCOUNTER — Ambulatory Visit

## 2023-12-06 DIAGNOSIS — M5459 Other low back pain: Secondary | ICD-10-CM | POA: Diagnosis not present

## 2023-12-06 DIAGNOSIS — G8929 Other chronic pain: Secondary | ICD-10-CM | POA: Diagnosis not present

## 2023-12-06 DIAGNOSIS — M542 Cervicalgia: Secondary | ICD-10-CM | POA: Diagnosis not present

## 2023-12-06 DIAGNOSIS — M545 Low back pain, unspecified: Secondary | ICD-10-CM | POA: Diagnosis not present

## 2023-12-06 NOTE — Therapy (Signed)
 OUTPATIENT PHYSICAL THERAPY TREATMENT   Patient Name: Francis Jones MRN: [id number] DOB:Sep 23, 1950, 73 y.o., male Today's Date: 12/06/2023  END OF SESSION:  PT End of Session - 12/06/23 0818     Visit Number 2    Number of Visits 17    Date for PT Re-Evaluation 01/31/24    PT Start Time 0818    PT Stop Time 0901    PT Time Calculation (min) 43 min    Activity Tolerance Patient tolerated treatment well    Behavior During Therapy [facility] for tasks assessed/performed           Past Medical History:  Diagnosis Date   Allergic rhinitis    Aortic atherosclerosis (HCC)    Arthritis    Atrial fibrillation (HCC) 12/16/2020   a.) CHA2DS2VASc = 2 (age, vascular disease history);  b.) rate/rhythm maintained on oral metoprolol  succinate; has refused DCCV and chronic oral anticoagulation   Cataract    Coronary artery calcification seen on CT scan    Depression    Diverticulosis    Dyspnea    HLD (hyperlipidemia)    PONV (postoperative nausea and vomiting)    Urothelial carcinoma of bladder (HCC) 02/04/2018   a.) s/p TURBT 02/04/2018: pathology (+) high grade non-invasive urothelial carcinoma (no muscularis propria invasion); b.) s/p TURBT 10/13/2019 --> pathology (+): low grade non-invasive urothelial carcinoma (no muscularis propria invasion); c.) s/p TURBT 12/27/2020: pathology (+) low grade non-invasive urothelial carcinoma (no muscularis propria invasion)   Past Surgical History:  Procedure Laterality Date   APPENDECTOMY  1973   BLADDER INSTILLATION N/A 12/04/2022   Procedure: BLADDER INSTILLATION OF GEMCITABINE ;  Surgeon: Twylla Glendia BROCKS, MD;  Location: [facility];  Service: Urology;  Laterality: N/A;   COLONOSCOPY N/A 10/21/2023   Procedure: COLONOSCOPY;  Surgeon: Therisa Bi, MD;  Location: [facility];  Service: Gastroenterology;  Laterality: N/A;   CYSTOSCOPY WITH BIOPSY N/A 12/27/2020   Procedure: CYSTOSCOPY WITH BLADDER BIOPSY;  Surgeon: Twylla Glendia BROCKS, MD;  Location:  [facility];  Service: Urology;  Laterality: N/A;   CYSTOSCOPY WITH FULGERATION N/A 12/27/2020   Procedure: CYSTOSCOPY WITH FULGERATION;  Surgeon: Twylla Glendia BROCKS, MD;  Location: [facility];  Service: Urology;  Laterality: N/A;   CYSTOSCOPY WITH STENT PLACEMENT Right 02/04/2018   Procedure: CYSTOSCOPY;  Surgeon: Twylla Glendia BROCKS, MD;  Location: [facility];  Service: Urology;  Laterality: Right;   FINGER SURGERY Left 2001   titanium in left 5th finger   FRACTURE SURGERY     HERNIA REPAIR  2000   SHOULDER SURGERY Left 2009   TRANSURETHRAL RESECTION OF BLADDER TUMOR N/A 02/04/2018   Procedure: TRANSURETHRAL RESECTION OF BLADDER TUMOR (TURBT) with gemcitabine ;  Surgeon: Twylla Glendia BROCKS, MD;  Location: [facility];  Service: Urology;  Laterality: N/A;   TRANSURETHRAL RESECTION OF BLADDER TUMOR N/A 10/13/2019   Procedure: TRANSURETHRAL RESECTION OF BLADDER TUMOR (TURBT) with gemcitabine ;  Surgeon: Twylla Glendia BROCKS, MD;  Location: [facility];  Service: Urology;  Laterality: N/A;   TRANSURETHRAL RESECTION OF BLADDER TUMOR N/A 12/04/2022   Procedure: TRANSURETHRAL RESECTION OF BLADDER TUMOR (TURBT);  Surgeon: Twylla Glendia BROCKS, MD;  Location: [facility];  Service: Urology;  Laterality: N/A;   VASECTOMY  1987   Patient Active Problem List   Diagnosis Date Noted   Aortic atherosclerosis (HCC) 09/24/2023   Permanent atrial fibrillation (HCC) 09/12/2023   Obesity, morbid (HCC) 09/12/2023   Peripheral neuropathy 09/12/2023   Cancer of overlapping sites of bladder (HCC) 01/11/2021   Allergic rhinitis 12/30/2015  HLD (hyperlipidemia) 12/30/2015    PCP: Donzella Lauraine SAILOR, DO  REFERRING PROVIDER: Donzella Lauraine SAILOR, DO  REFERRING DIAG: M54.2 (ICD-10-CM) - Neck pain M54.50,G89.29 (ICD-10-CM) - Chronic low back pain, unspecified back pain laterality, unspecified whether sciatica present  Rationale for Evaluation and Treatment: Rehabilitation  THERAPY DIAG:  Other low back pain  Cervicalgia  ONSET DATE: Neck:  2-3 years ago; back: 10 years ago  SUBJECTIVE:                                                                                                                                                                                           SUBJECTIVE STATEMENT: Neck does not hurt but conscious of it. Back is alright. He is in pretty good shape this morning.      PERTINENT HISTORY:  Low back and neck pain. Just got back from interring his wife's ashes. Emotional currently. Pt was in for a medicare physical recently. Unable to put his head back when he lays in supine. Was then referred to PT. Trying to lift his neck up to drink a can of soda bothers his back because he can't get his neck back far enough. Feels like his back pain is also due to his belly. Back pain has been going on for about 10 years, gradual onset. Has B plantar paresthesia, feels like socks are bunched up on his foot. Has R and L anterior lateral thigh paresthesia (lateral femoral cutaneous nerve) when pt sits for a while.   Neck pain located lower cervical spine and posterior upper cervical spine. Neck pain began about 2-3 years ago, gradual onset. Neck bothers him more currently. More of a concern because he feels like his neck pain is getting worse.   No blood pressure problems Has atrial fibrillation, treated with metroprolol  No latex allergy      PAIN:  Are you having pain? Yes: NPRS scale: 0/10 Pain location: lower and upper cervical spine Pain description: pressure, discomfort Aggravating factors: looking up, R cervical rotation Relieving factors: resting position, cervical protraction.   PRECAUTIONS: no known precautions   RED FLAGS: Bowel or bladder incontinence: No and Cauda equina syndrome: No   WEIGHT BEARING RESTRICTIONS: No  FALLS:  Has patient fallen in last 6 months? 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   NEXT MD VISIT: Early August 2025  OBJECTIVE:  Note:  Objective measures were completed at Evaluation unless otherwise noted.  DIAGNOSTIC FINDINGS:    PATIENT SURVEYS:  NDI:  NECK DISABILITY INDEX  Date: 12/04/23 Score  Pain intensity 0 = I have no pain at the moment  2. Personal care (washing, dressing, etc.) 0 = I can look after myself normally without causing extra pain  3. Lifting 0 =  I can lift heavy weights without extra pain  4. Reading 0 = I can read as much as I want to with no pain in my neck  5. Headaches 1 =  I have slight headaches, which come infrequently  6. Concentration 2 = I have a fair degree of difficulty in concentrating when I want to (recently widowed per pt)  7. Work 0 =  I can do as much work as I want to  8. Driving 0 = I can drive my car without any neck pain  9. Sleeping 3 =  My sleep is moderately disturbed (2-3 hrs sleepless)  10. Recreation 0 = I am able to engage in all my recreation activities with no neck pain at all  Total 6/50 (12%)   Minimum Detectable Change (90% confidence): 5 points or 10% points  COGNITION: Overall cognitive status: Within functional limits for tasks assessed     SENSATION:   MUSCLE LENGTH:   POSTURE:  Forward neck, thoracic kyphosis, B protracted shoulders, movement preference around C5/6 and C2/3 area. Backward lean, B genu varus R > L, L low back rotation, R lumbar convexity, R lateral shift   PALPATION: Cervical paraspinal and B upper trap muscle tension   CERVICAL ROM:   Active ROM A/PROM (deg) eval  Flexion WFL  Extension Limited with reproduction of pain  Right lateral flexion Limited with L lateral neck stretch  Left lateral flexion WFL with R lateral neck stretch    Right rotation 55 degrees (with neck  pain with overpressure)  Left rotation 53 degrees (with neck and R upper trap area pain)   (Blank rows = not tested)  UPPER EXTREMITY MMT:  MMT Right eval Left eval  Shoulder flexion 4 4  Shoulder extension    Shoulder abduction    Shoulder adduction    Shoulder extension    Shoulder internal rotation    Shoulder external rotation    Middle trapezius    Lower trapezius (seated manually resisted) 4 4  Elbow flexion 4 4  Elbow extension 5 4  Wrist flexion    Wrist extension 4 4  Wrist ulnar deviation    Wrist radial deviation    Wrist pronation    Wrist supination    Grip strength     (Blank rows = not tested)   LUMBAR ROM:   AROM eval  Flexion   Extension   Right lateral flexion   Left lateral flexion   Right rotation   Left rotation    (Blank rows = not tested)  LOWER EXTREMITY ROM:     Active  Right eval Left eval  Hip flexion    Hip extension    Hip abduction    Hip adduction    Hip internal rotation    Hip external rotation    Knee flexion    Knee extension    Ankle dorsiflexion    Ankle plantarflexion    Ankle inversion    Ankle eversion     (Blank rows = not tested)  LOWER EXTREMITY MMT:    MMT Right eval Left eval  Hip flexion  Hip extension    Hip abduction    Hip adduction    Hip internal rotation    Hip external rotation    Knee flexion    Knee extension    Ankle dorsiflexion    Ankle plantarflexion    Ankle inversion    Ankle eversion     (Blank rows = not tested)  LUMBAR SPECIAL TESTS:    FUNCTIONAL TESTS:    GAIT: Distance walked:  Assistive device utilized:  Level of assistance:  Comments:   TREATMENT DATE: 12/06/2023                                                                                                                               Neuromuscular re education  Performed to improve posture and help decrease stress to neck and back   Seated chin tuck 10x5 second holds for 3 sets   Seated B  scapular retraction 10x3 with 5 second holds  Seated B shoulder horizontal abduction 10x3 with 5 second holds to promote thoracic extension and pectoralis muscle stretch.   Standing B shoulder extension with scapular retraction green band. 10x5 seconds for 3 sets  Standing glute set 10x5 seconds for 3 sets    Improved exercise technique, movement at target joints, use of target muscles after mod verbal, visual, tactile cues.    PATIENT EDUCATION:  Education details: there-ex, HEP Person educated: Patient Education method: Explanation, Demonstration, Tactile cues, Verbal cues, and Handouts Education comprehension: verbalized understanding and returned demonstration  HOME EXERCISE PROGRAM: Access Code: [email or web address] URL: [email or web address] Date: 12/06/2023 Prepared by: Emil Glassman  Exercises - Seated Cervical Retraction  - 3 x daily - 7 x weekly - 3 sets - 10 reps - 5 seconds  hold - Seated Scapular Retraction  - 3 x daily - 7 x weekly - 3 sets - 10 reps - 5 seconds hold - Seated Shoulder Horizontal Abduction - Thumbs Up  - 3 x daily - 7 x weekly - 3 sets - 10 reps - 5 seconds hold - Shoulder extension with resistance - Neutral  - 1 x daily - 7 x weekly - 3 sets - 10 reps - 5 seconds hold  Green band  - Standing Gluteal Sets  - 3 x daily - 7 x weekly - 3 sets - 10 reps - 5 seconds hold  - Seated Transversus Abdominis Bracing  - 3 x daily - 7 x weekly - 3 sets - 10 reps - 5 seconds hold     ASSESSMENT:  CLINICAL IMPRESSION: Worked on thoracic extension, pectoralis stretching, scapular muscle activation, trunk and glute muscle activation to help decrease stress to lower cervical and lumbar spine. Pt tolerated session well without aggravagtion of symptoms. Pt will benefit from continued skilled physical therapy services to decrease pain, improve strength and function.     OBJECTIVE IMPAIRMENTS: decreased ROM, decreased strength, improper body mechanics, postural  dysfunction, and pain.   ACTIVITY LIMITATIONS: lifting,  standing, reach over head, and locomotion level  PARTICIPATION LIMITATIONS:   PERSONAL FACTORS: Age, Fitness, Time since onset of injury/illness/exacerbation, and 3+ comorbidities: Aortic atherosclerosis, atrial fibrillation, depression,  are also affecting patient's functional outcome.   REHAB POTENTIAL: Fair    CLINICAL DECISION MAKING: Evolving/moderate complexity, neck pain is worse since onset per pt  EVALUATION COMPLEXITY: Moderate   GOALS: Goals reviewed with patient? Yes  SHORT TERM GOALS: Target date: 12/13/2023  Pt will be independent with his initial HEP to improve posture, strength, decrease pain, improved function.  Baseline: Pt has not yet started his initial HEP (12/04/2023) Goal status: INITIAL   LONG TERM GOALS: Target date: 01/31/2024  Pt will have a decrease in neck pain to 1/10 or less at worst to promote ability to look up and around more comfortably  Baseline: 3/10 neck pain at most for the past 3 months (12/04/2023) Goal status: INITIAL  2.  Pt will have a decrease in low back pain to 1/10 or less at worst to promote ability to perform standing tasks more comfortably.  Baseline: 3/10 at most for the past 3 months (12/04/2023) Goal status: INITIAL  3.  Pt will improve his Neck Disability Index (NDI) to 4% or less as a demonstration of improved function.  Baseline: Neck Disability Index (NDI) 6/50 (12%) (12/04/2023) Goal status: INITIAL  4.  Pt will improve his B lower trap strength by at least 1/2 MMT grade to promote ability to look around with less neck pain.  Baseline:  MMT Right eval Left eval  Lower trapezius (seated manually resisted) 4 4   Goal status: INITIAL   PLAN:  PT FREQUENCY: 1-2x/week  PT DURATION: 8 weeks  PLANNED INTERVENTIONS: 97110-Therapeutic exercises, 97530- Therapeutic activity, [id number]- Neuromuscular re-education, 97535- Self Care, [id number]- Manual therapy, [phone]- Gait  training, [phone]- Aquatic Therapy, [phone]- Electrical stimulation (unattended), [phone]- Traction (mechanical), [id number]- Ionotophoresis 4mg /ml Dexamethasone , Patient/Family education, Joint mobilization, and Spinal mobilization.  PLAN FOR NEXT SESSION: Posture, thoracic extension, scapular and trunk strengthening, manual techniques, modalities PRN   Jesselle Laflamme, PT, DPT 12/06/2023, 11:41 AM

## 2023-12-09 ENCOUNTER — Ambulatory Visit

## 2023-12-09 DIAGNOSIS — M5459 Other low back pain: Secondary | ICD-10-CM | POA: Diagnosis not present

## 2023-12-09 DIAGNOSIS — G8929 Other chronic pain: Secondary | ICD-10-CM | POA: Diagnosis not present

## 2023-12-09 DIAGNOSIS — M542 Cervicalgia: Secondary | ICD-10-CM

## 2023-12-09 DIAGNOSIS — M545 Low back pain, unspecified: Secondary | ICD-10-CM | POA: Diagnosis not present

## 2023-12-09 NOTE — Therapy (Signed)
 OUTPATIENT PHYSICAL THERAPY TREATMENT   Patient Name: CHIDERA THIVIERGE MRN: [id number] DOB:06-26-1950, 73 y.o., male Today's Date: 12/09/2023  END OF SESSION:  PT End of Session - 12/09/23 0822     Visit Number 3    Number of Visits 17    Date for PT Re-Evaluation 01/31/24    PT Start Time 0822    PT Stop Time 0902    PT Time Calculation (min) 40 min    Activity Tolerance Patient tolerated treatment well    Behavior During Therapy [facility] for tasks assessed/performed            Past Medical History:  Diagnosis Date   Allergic rhinitis    Aortic atherosclerosis (HCC)    Arthritis    Atrial fibrillation (HCC) 12/16/2020   a.) CHA2DS2VASc = 2 (age, vascular disease history);  b.) rate/rhythm maintained on oral metoprolol  succinate; has refused DCCV and chronic oral anticoagulation   Cataract    Coronary artery calcification seen on CT scan    Depression    Diverticulosis    Dyspnea    HLD (hyperlipidemia)    PONV (postoperative nausea and vomiting)    Urothelial carcinoma of bladder (HCC) 02/04/2018   a.) s/p TURBT 02/04/2018: pathology (+) high grade non-invasive urothelial carcinoma (no muscularis propria invasion); b.) s/p TURBT 10/13/2019 --> pathology (+): low grade non-invasive urothelial carcinoma (no muscularis propria invasion); c.) s/p TURBT 12/27/2020: pathology (+) low grade non-invasive urothelial carcinoma (no muscularis propria invasion)   Past Surgical History:  Procedure Laterality Date   APPENDECTOMY  1973   BLADDER INSTILLATION N/A 12/04/2022   Procedure: BLADDER INSTILLATION OF GEMCITABINE ;  Surgeon: Twylla Glendia BROCKS, MD;  Location: [facility] ORS;  Service: Urology;  Laterality: N/A;   COLONOSCOPY N/A 10/21/2023   Procedure: COLONOSCOPY;  Surgeon: Therisa Bi, MD;  Location: [facility] END[facility]COPY;  Service: Gastroenterology;  Laterality: N/A;   CYSTOSCOPY WITH BIOPSY N/A 12/27/2020   Procedure: CYSTOSCOPY WITH BLADDER BIOPSY;  Surgeon: Twylla Glendia BROCKS, MD;  Location:  [facility] ORS;  Service: Urology;  Laterality: N/A;   CYSTOSCOPY WITH FULGERATION N/A 12/27/2020   Procedure: CYSTOSCOPY WITH FULGERATION;  Surgeon: Twylla Glendia BROCKS, MD;  Location: [facility] OR[facility];  Service: Urology;  Laterality: N/A;   CYSTOSCOPY WITH STENT PLACEMENT Right 02/04/2018   Procedure: CYSTOSCOPY;  Surgeon: Twylla Glendia BROCKS, MD;  Location: [facility] ORS;  Service: Urology;  Laterality: Right;   FINGER SURGERY Left 2001   titanium in left 5th finger   FRACTURE SURGERY     HERNIA REPAIR  2000   SHOULDER SURGERY Left 2009   TRANSURETHRAL RESECTION OF BLADDER TUMOR N/A 02/04/2018   Procedure: TRANSURETHRAL RESECTION OF BLADDER TUMOR (TURBT) with gemcitabine ;  Surgeon: Twylla Glendia BROCKS, MD;  Location: [facility] OR[facility];  Service: Urology;  Laterality: N/A;   TRANSURETHRAL RESECTION OF BLADDER TUMOR N/A 10/13/2019   Procedure: TRANSURETHRAL RESECTION OF BLADDER TUMOR (TURBT) with gemcitabine ;  Surgeon: Twylla Glendia BROCKS, MD;  Location: [facility] ORS;  Service: Urology;  Laterality: N/A;   TRANSURETHRAL RESECTION OF BLADDER TUMOR N/A 12/04/2022   Procedure: TRANSURETHRAL RESECTION OF BLADDER TUMOR (TURBT);  Surgeon: Twylla Glendia BROCKS, MD;  Location: [facility] ORS;  Service: Urology;  Laterality: N/A;   VASECTOMY  1987   Patient Active Problem List   Diagnosis Date Noted   Aortic atherosclerosis (HCC) 09/24/2023   Permanent atrial fibrillation (HCC) 09/12/2023   Obesity, morbid (HCC) 09/12/2023   Peripheral neuropathy 09/12/2023   Cancer of overlapping sites of bladder (HCC) 01/11/2021   Allergic rhinitis  12/30/2015   HLD (hyperlipidemia) 12/30/2015    PCP: Donzella Lauraine SAILOR, DO  REFERRING PROVIDER: Donzella Lauraine SAILOR, DO  REFERRING DIAG: M54.2 (ICD-10-CM) - Neck pain M54.50,G89.29 (ICD-10-CM) - Chronic low back pain, unspecified back pain laterality, unspecified whether sciatica present  Rationale for Evaluation and Treatment: Rehabilitation  THERAPY DIAG:  Other low back pain  Cervicalgia  ONSET DATE: Neck:  2-3 years ago; back: 10 years ago  SUBJECTIVE:                                                                                                                                                                                           SUBJECTIVE STATEMENT: Has not been diligent with his HEP.  Feels like the grief (from his wife's death) has made him scatterbrained and easily distraction. Neck and back he thinks is doing alright. No neck or back pain currently.      PERTINENT HISTORY:  Low back and neck pain. Just got back from interring his wife's ashes. Emotional currently. Pt was in for a medicare physical recently. Unable to put his head back when he lays in supine. Was then referred to PT. Trying to lift his neck up to drink a can of soda bothers his back because he can't get his neck back far enough. Feels like his back pain is also due to his belly. Back pain has been going on for about 10 years, gradual onset. Has B plantar paresthesia, feels like socks are bunched up on his foot. Has R and L anterior lateral thigh paresthesia (lateral femoral cutaneous nerve) when pt sits for a while.   Neck pain located lower cervical spine and posterior upper cervical spine. Neck pain began about 2-3 years ago, gradual onset. Neck bothers him more currently. More of a concern because he feels like his neck pain is getting worse.   No blood pressure problems Has atrial fibrillation, treated with metroprolol  No latex allergy      PAIN:  Are you having pain? Yes: NPRS scale: 0/10 Pain location: lower and upper cervical spine Pain description: pressure, discomfort Aggravating factors: looking up, R cervical rotation Relieving factors: resting position, cervical protraction.   PRECAUTIONS: no known precautions   RED FLAGS: Bowel or bladder incontinence: No and Cauda equina syndrome: No   WEIGHT BEARING RESTRICTIONS: No  FALLS:  Has patient fallen in last 6 months? 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   NEXT MD VISIT: Early August 2025  OBJECTIVE:  Note: Objective measures were completed at Evaluation unless otherwise noted.  DIAGNOSTIC FINDINGS:    PATIENT SURVEYS:  NDI:  NECK DISABILITY INDEX  Date: 12/04/23 Score  Pain intensity 0 = I have no pain at the moment  2. Personal care (washing, dressing, etc.) 0 = I can look after myself normally without causing extra pain  3. Lifting 0 =  I can lift heavy weights without extra pain  4. Reading 0 = I can read as much as I want to with no pain in my neck  5. Headaches 1 =  I have slight headaches, which come infrequently  6. Concentration 2 = I have a fair degree of difficulty in concentrating when I want to (recently widowed per pt)  7. Work 0 =  I can do as much work as I want to  8. Driving 0 = I can drive my car without any neck pain  9. Sleeping 3 =  My sleep is moderately disturbed (2-3 hrs sleepless)  10. Recreation 0 = I am able to engage in all my recreation activities with no neck pain at all  Total 6/50 (12%)   Minimum Detectable Change (90% confidence): 5 points or 10% points  COGNITION: Overall cognitive status: Within functional limits for tasks assessed     SENSATION:   MUSCLE LENGTH:   POSTURE:  Forward neck, thoracic kyphosis, B protracted shoulders, movement preference around C5/6 and C2/3 area. Backward lean, B genu varus R > L, L low back rotation, R lumbar convexity, R lateral shift   PALPATION: Cervical paraspinal and B upper trap muscle tension   CERVICAL ROM:   Active ROM A/PROM (deg) eval  Flexion WFL  Extension Limited with reproduction of pain  Right lateral flexion Limited with  L lateral neck stretch  Left lateral flexion WFL with R lateral neck stretch    Right rotation 55 degrees (with neck pain with overpressure)  Left rotation 53 degrees (with neck and R upper trap area pain)   (Blank rows = not tested)  UPPER EXTREMITY MMT:  MMT Right eval Left eval  Shoulder flexion 4 4  Shoulder extension    Shoulder abduction    Shoulder adduction    Shoulder extension    Shoulder internal rotation    Shoulder external rotation    Middle trapezius    Lower trapezius (seated manually resisted) 4 4  Elbow flexion 4 4  Elbow extension 5 4  Wrist flexion    Wrist extension 4 4  Wrist ulnar deviation    Wrist radial deviation    Wrist pronation    Wrist supination    Grip strength     (Blank rows = not tested)   LUMBAR ROM:   AROM eval  Flexion   Extension   Right lateral flexion   Left lateral flexion   Right rotation   Left rotation    (Blank rows = not tested)  LOWER EXTREMITY ROM:     Active  Right eval Left eval  Hip flexion    Hip extension    Hip abduction    Hip adduction    Hip internal rotation    Hip external rotation    Knee flexion    Knee extension    Ankle dorsiflexion    Ankle plantarflexion    Ankle inversion    Ankle eversion     (  Blank rows = not tested)  LOWER EXTREMITY MMT:    MMT Right eval Left eval  Hip flexion    Hip extension    Hip abduction    Hip adduction    Hip internal rotation    Hip external rotation    Knee flexion    Knee extension    Ankle dorsiflexion    Ankle plantarflexion    Ankle inversion    Ankle eversion     (Blank rows = not tested)  LUMBAR SPECIAL TESTS:    FUNCTIONAL TESTS:    GAIT: Distance walked:  Assistive device utilized:  Level of assistance:  Comments:   TREATMENT DATE: 12/09/2023                                                                                                                               Neuromuscular re education  Performed to improve  posture and help decrease stress to neck and back  Hooklying   Posterior pelvic tilt 10x5 seconds for 2 sets   Hip extension isometrics, leg straight    R 10x5 seconds for 3 sets    L 10x5 seconds for 3 sets    B scapular retraction with chin tuck 10x5 second to promote thoracic extension    Then with glute max squeeze 10x5 seconds   Cues needed to keep pt in task  Improved exercise technique, movement at target joints, use of target muscles after mod verbal, visual, tactile cues.      PATIENT EDUCATION:  Education details: there-ex, HEP Person educated: Patient Education method: Explanation, Demonstration, Tactile cues, Verbal cues, and Handouts Education comprehension: verbalized understanding and returned demonstration  HOME EXERCISE PROGRAM: Access Code: [email or web address] URL: [email or web address] Date: 12/06/2023 Prepared by: Emil Glassman  Exercises - Seated Cervical Retraction  - 3 x daily - 7 x weekly - 3 sets - 10 reps - 5 seconds  hold - Seated Scapular Retraction  - 3 x daily - 7 x weekly - 3 sets - 10 reps - 5 seconds hold - Seated Shoulder Horizontal Abduction - Thumbs Up  - 3 x daily - 7 x weekly - 3 sets - 10 reps - 5 seconds hold - Shoulder extension with resistance - Neutral  - 1 x daily - 7 x weekly - 3 sets - 10 reps - 5 seconds hold  Green band  - Standing Gluteal Sets  - 3 x daily - 7 x weekly - 3 sets - 10 reps - 5 seconds hold  - Seated Transversus Abdominis Bracing  - 3 x daily - 7 x weekly - 3 sets - 10 reps - 5 seconds hold   Hooklying   Hip extension isometrics, leg straight    R 10x5 seconds for 3 sets   L 10x5 seconds for 3 sets   ASSESSMENT:  CLINICAL IMPRESSION: Continued working on thoracic extension, scapular muscle activation, trunk and glute muscle activation to help decrease stress to  lower cervical and lumbar spine. Pt tolerated session well without aggravagtion of symptoms. Pt will benefit from continued skilled  physical therapy services to decrease pain, improve strength and function.     OBJECTIVE IMPAIRMENTS: decreased ROM, decreased strength, improper body mechanics, postural dysfunction, and pain.   ACTIVITY LIMITATIONS: lifting, standing, reach over head, and locomotion level  PARTICIPATION LIMITATIONS:   PERSONAL FACTORS: Age, Fitness, Time since onset of injury/illness/exacerbation, and 3+ comorbidities: Aortic atherosclerosis, atrial fibrillation, depression,  are also affecting patient's functional outcome.   REHAB POTENTIAL: Fair    CLINICAL DECISION MAKING: Evolving/moderate complexity, neck pain is worse since onset per pt  EVALUATION COMPLEXITY: Moderate   GOALS: Goals reviewed with patient? Yes  SHORT TERM GOALS: Target date: 12/13/2023  Pt will be independent with his initial HEP to improve posture, strength, decrease pain, improved function.  Baseline: Pt has not yet started his initial HEP (12/04/2023) Goal status: INITIAL   LONG TERM GOALS: Target date: 01/31/2024  Pt will have a decrease in neck pain to 1/10 or less at worst to promote ability to look up and around more comfortably  Baseline: 3/10 neck pain at most for the past 3 months (12/04/2023) Goal status: INITIAL  2.  Pt will have a decrease in low back pain to 1/10 or less at worst to promote ability to perform standing tasks more comfortably.  Baseline: 3/10 at most for the past 3 months (12/04/2023) Goal status: INITIAL  3.  Pt will improve his Neck Disability Index (NDI) to 4% or less as a demonstration of improved function.  Baseline: Neck Disability Index (NDI) 6/50 (12%) (12/04/2023) Goal status: INITIAL  4.  Pt will improve his B lower trap strength by at least 1/2 MMT grade to promote ability to look around with less neck pain.  Baseline:  MMT Right eval Left eval  Lower trapezius (seated manually resisted) 4 4   Goal status: INITIAL   PLAN:  PT FREQUENCY: 1-2x/week  PT DURATION: 8  weeks  PLANNED INTERVENTIONS: 97110-Therapeutic exercises, 97530- Therapeutic activity, [id number]- Neuromuscular re-education, 97535- Self Care, [id number]- Manual therapy, [phone]- Gait training, [phone]- Aquatic Therapy, [phone]- Electrical stimulation (unattended), [phone]- Traction (mechanical), [id number]- Ionotophoresis 4mg /ml Dexamethasone , Patient/Family education, Joint mobilization, and Spinal mobilization.  PLAN FOR NEXT SESSION: Posture, thoracic extension, scapular and trunk strengthening, manual techniques, modalities PRN   Abhay Godbolt, PT, DPT 12/09/2023, 11:47 AM

## 2023-12-10 DIAGNOSIS — D2262 Melanocytic nevi of left upper limb, including shoulder: Secondary | ICD-10-CM | POA: Diagnosis not present

## 2023-12-10 DIAGNOSIS — D408 Neoplasm of uncertain behavior of other specified male genital organs: Secondary | ICD-10-CM | POA: Diagnosis not present

## 2023-12-10 DIAGNOSIS — D2261 Melanocytic nevi of right upper limb, including shoulder: Secondary | ICD-10-CM | POA: Diagnosis not present

## 2023-12-10 DIAGNOSIS — D2271 Melanocytic nevi of right lower limb, including hip: Secondary | ICD-10-CM | POA: Diagnosis not present

## 2023-12-10 DIAGNOSIS — Z85828 Personal history of other malignant neoplasm of skin: Secondary | ICD-10-CM | POA: Diagnosis not present

## 2023-12-10 DIAGNOSIS — L570 Actinic keratosis: Secondary | ICD-10-CM | POA: Diagnosis not present

## 2023-12-10 DIAGNOSIS — D2272 Melanocytic nevi of left lower limb, including hip: Secondary | ICD-10-CM | POA: Diagnosis not present

## 2023-12-11 ENCOUNTER — Telehealth: Payer: Self-pay

## 2023-12-11 ENCOUNTER — Ambulatory Visit

## 2023-12-11 NOTE — Telephone Encounter (Signed)
 Called patient about missed 1 p.m. appointment for PT. Per pt he thought his appointment was at 1:45 p.m. Chartered loss adjuster and pt confirmed next f/u appointment date and time and encouraged pt to check schedule and call us  for any discrepancies on our end due to misunderstanding.

## 2023-12-12 ENCOUNTER — Telehealth: Payer: Self-pay

## 2023-12-12 NOTE — Telephone Encounter (Signed)
 Pt has requested to reschedule his 01/13/24 colonoscopy due to returning from [place] trip.  Colonoscopy has been rescheduled to 03/05/24.  Endo has been notified.  Instructions updated.  Thanks,  [place], CMA

## 2023-12-21 ENCOUNTER — Encounter: Payer: Self-pay | Admitting: Urology

## 2024-01-01 ENCOUNTER — Other Ambulatory Visit
Admission: RE | Admit: 2024-01-01 | Discharge: 2024-01-01 | Disposition: A | Discharge status: Home / Self Care | Payer: Self-pay | Source: Ambulatory Visit | Attending: Medical Genetics | Admitting: Medical Genetics

## 2024-01-01 ENCOUNTER — Other Ambulatory Visit: Payer: Self-pay | Admitting: Medical Genetics

## 2024-01-01 DIAGNOSIS — Z006 Encounter for examination for normal comparison and control in clinical research program: Secondary | ICD-10-CM

## 2024-01-01 NOTE — Progress Notes (Signed)
 01/01/24 Francis Jones  Confirmed with participant on 01/01/24 that they have not provided a sample for Jones. First Jones order was placed and released by PCP on 09/24/2023, but no sample was collected. Placing a new order for participant to provide sample.

## 2024-01-02 ENCOUNTER — Ambulatory Visit: Attending: Family Medicine

## 2024-01-02 DIAGNOSIS — M542 Cervicalgia: Secondary | ICD-10-CM | POA: Diagnosis not present

## 2024-01-02 DIAGNOSIS — M5459 Other low back pain: Secondary | ICD-10-CM | POA: Diagnosis not present

## 2024-01-02 NOTE — Therapy (Signed)
 OUTPATIENT PHYSICAL THERAPY TREATMENT   Patient Name: Francis Jones MRN: [id number] DOB:10/29/1950, 73 y.o., male Today's Date: 01/02/2024  END OF SESSION:  PT End of Session - 01/02/24 0819     Visit Number 4    Number of Visits 17    Date for PT Re-Evaluation 01/31/24    PT Start Time 0819    PT Stop Time 0859    PT Time Calculation (min) 40 min    Activity Tolerance Patient tolerated treatment well    Behavior During Therapy [facility] for tasks assessed/performed             Past Medical History:  Diagnosis Date   Allergic rhinitis    Aortic atherosclerosis (HCC)    Arthritis    Atrial fibrillation (HCC) 12/16/2020   a.) CHA2DS2VASc = 2 (age, vascular disease history);  b.) rate/rhythm maintained on oral metoprolol  succinate; has refused DCCV and chronic oral anticoagulation   Cataract    Coronary artery calcification seen on CT scan    Depression    Diverticulosis    Dyspnea    HLD (hyperlipidemia)    PONV (postoperative nausea and vomiting)    Urothelial carcinoma of bladder (HCC) 02/04/2018   a.) s/p TURBT 02/04/2018: pathology (+) high grade non-invasive urothelial carcinoma (no muscularis propria invasion); b.) s/p TURBT 10/13/2019 --> pathology (+): low grade non-invasive urothelial carcinoma (no muscularis propria invasion); c.) s/p TURBT 12/27/2020: pathology (+) low grade non-invasive urothelial carcinoma (no muscularis propria invasion)   Past Surgical History:  Procedure Laterality Date   APPENDECTOMY  1973   BLADDER INSTILLATION N/A 12/04/2022   Procedure: BLADDER INSTILLATION OF GEMCITABINE ;  Surgeon: Twylla Glendia BROCKS, MD;  Location: [facility] ORS;  Service: Urology;  Laterality: N/A;   COLONOSCOPY N/A 10/21/2023   Procedure: COLONOSCOPY;  Surgeon: Therisa Bi, MD;  Location: [facility] ENDOSCOPY;  Service: Gastroenterology;  Laterality: N/A;   CYSTOSCOPY WITH BIOPSY N/A 12/27/2020   Procedure: CYSTOSCOPY WITH BLADDER BIOPSY;  Surgeon: Twylla Glendia BROCKS, MD;   Location: [facility] ORS;  Service: Urology;  Laterality: N/A;   CYSTOSCOPY WITH FULGERATION N/A 12/27/2020   Procedure: CYSTOSCOPY WITH FULGERATION;  Surgeon: Twylla Glendia BROCKS, MD;  Location: [facility] ORS;  Service: Urology;  Laterality: N/A;   CYSTOSCOPY WITH STENT PLACEMENT Right 02/04/2018   Procedure: CYSTOSCOPY;  Surgeon: Twylla Glendia BROCKS, MD;  Location: [facility] ORS;  Service: Urology;  Laterality: Right;   FINGER SURGERY Left 2001   titanium in left 5th finger   FRACTURE SURGERY     HERNIA REPAIR  2000   SHOULDER SURGERY Left 2009   TRANSURETHRAL RESECTION OF BLADDER TUMOR N/A 02/04/2018   Procedure: TRANSURETHRAL RESECTION OF BLADDER TUMOR (TURBT) with gemcitabine ;  Surgeon: Twylla Glendia BROCKS, MD;  Location: [facility] ORS;  Service: Urology;  Laterality: N/A;   TRANSURETHRAL RESECTION OF BLADDER TUMOR N/A 10/13/2019   Procedure: TRANSURETHRAL RESECTION OF BLADDER TUMOR (TURBT) with gemcitabine ;  Surgeon: Twylla Glendia BROCKS, MD;  Location: [facility] ORS;  Service: Urology;  Laterality: N/A;   TRANSURETHRAL RESECTION OF BLADDER TUMOR N/A 12/04/2022   Procedure: TRANSURETHRAL RESECTION OF BLADDER TUMOR (TURBT);  Surgeon: Twylla Glendia BROCKS, MD;  Location: [facility] ORS;  Service: Urology;  Laterality: N/A;   VASECTOMY  1987   Patient Active Problem List   Diagnosis Date Noted   Aortic atherosclerosis (HCC) 09/24/2023   Permanent atrial fibrillation (HCC) 09/12/2023   Obesity, morbid (HCC) 09/12/2023   Peripheral neuropathy 09/12/2023   Cancer of overlapping sites of bladder (HCC) 01/11/2021   Allergic  rhinitis 12/30/2015   HLD (hyperlipidemia) 12/30/2015    PCP: Donzella Lauraine SAILOR, DO  REFERRING PROVIDER: Donzella Lauraine SAILOR, DO  REFERRING DIAG: M54.2 (ICD-10-CM) - Neck pain M54.50,G89.29 (ICD-10-CM) - Chronic low back pain, unspecified back pain laterality, unspecified whether sciatica present  Rationale for Evaluation and Treatment: Rehabilitation  THERAPY DIAG:  Other low back pain  Cervicalgia  ONSET  DATE: Neck: 2-3 years ago; back: 10 years ago  SUBJECTIVE:                                                                                                                                                                                           SUBJECTIVE STATEMENT: Has not been doing his HEP. Neck and back do not hurt but is aware of them.     PERTINENT HISTORY:  Low back and neck pain. Just got back from interring his wife's ashes. Emotional currently. Pt was in for a medicare physical recently. Unable to put his head back when he lays in supine. Was then referred to PT. Trying to lift his neck up to drink a can of soda bothers his back because he can't get his neck back far enough. Feels like his back pain is also due to his belly. Back pain has been going on for about 10 years, gradual onset. Has B plantar paresthesia, feels like socks are bunched up on his foot. Has R and L anterior lateral thigh paresthesia (lateral femoral cutaneous nerve) when pt sits for a while.   Neck pain located lower cervical spine and posterior upper cervical spine. Neck pain began about 2-3 years ago, gradual onset. Neck bothers him more currently. More of a concern because he feels like his neck pain is getting worse.   No blood pressure problems Has atrial fibrillation, treated with metroprolol  No latex allergy      PAIN:  Are you having pain? Yes: NPRS scale: 0/10 Pain location: lower and upper cervical spine Pain description: pressure, discomfort Aggravating factors: looking up, R cervical rotation Relieving factors: resting position, cervical protraction.   PRECAUTIONS: no known precautions   RED FLAGS: Bowel or bladder incontinence: No and Cauda equina syndrome: No   WEIGHT BEARING RESTRICTIONS: No  FALLS:  Has patient fallen in last 6 months? 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   NEXT MD VISIT: Early August 2025  OBJECTIVE:  Note: Objective measures were completed at Evaluation unless otherwise noted.  DIAGNOSTIC FINDINGS:    PATIENT SURVEYS:  NDI:  NECK DISABILITY INDEX  Date: 12/04/23 Score  Pain intensity 0 = I have no pain at the moment  2. Personal care (washing, dressing, etc.) 0 = I can look after myself normally without causing extra pain  3. Lifting 0 =  I can lift heavy weights without extra pain  4. Reading 0 = I can read as much as I want to with no pain in my neck  5. Headaches 1 =  I have slight headaches, which come infrequently  6. Concentration 2 = I have a fair degree of difficulty in concentrating when I want to (recently widowed per pt)  7. Work 0 =  I can do as much work as I want to  8. Driving 0 = I can drive my car without any neck pain  9. Sleeping 3 =  My sleep is moderately disturbed (2-3 hrs sleepless)  10. Recreation 0 = I am able to engage in all my recreation activities with no neck pain at all  Total 6/50 (12%)   Minimum Detectable Change (90% confidence): 5 points or 10% points  COGNITION: Overall cognitive status: Within functional limits for tasks assessed     SENSATION:   MUSCLE LENGTH:   POSTURE:  Forward neck, thoracic kyphosis, B protracted shoulders, movement preference around C5/6 and C2/3 area. Backward lean, B genu varus R > L, L low back rotation, R lumbar convexity, R lateral shift   PALPATION: Cervical paraspinal and B upper trap muscle tension   CERVICAL ROM:   Active ROM A/PROM (deg) eval  Flexion WFL  Extension Limited with reproduction of pain  Right lateral flexion Limited with L lateral neck stretch  Left lateral flexion WFL with R lateral neck stretch    Right rotation 55 degrees (with neck pain with  overpressure)  Left rotation 53 degrees (with neck and R upper trap area pain)   (Blank rows = not tested)  UPPER EXTREMITY MMT:  MMT Right eval Left eval  Shoulder flexion 4 4  Shoulder extension    Shoulder abduction    Shoulder adduction    Shoulder extension    Shoulder internal rotation    Shoulder external rotation    Middle trapezius    Lower trapezius (seated manually resisted) 4 4  Elbow flexion 4 4  Elbow extension 5 4  Wrist flexion    Wrist extension 4 4  Wrist ulnar deviation    Wrist radial deviation    Wrist pronation    Wrist supination    Grip strength     (Blank rows = not tested)   LUMBAR ROM:   AROM eval  Flexion   Extension   Right lateral flexion   Left lateral flexion   Right rotation   Left rotation    (Blank rows = not tested)  LOWER EXTREMITY ROM:     Active  Right eval Left eval  Hip flexion    Hip extension    Hip abduction    Hip adduction    Hip internal rotation    Hip external rotation    Knee flexion    Knee extension    Ankle dorsiflexion    Ankle plantarflexion    Ankle inversion    Ankle eversion     (Blank rows = not tested)  LOWER EXTREMITY MMT:    MMT Right eval Left eval  Hip  flexion    Hip extension    Hip abduction    Hip adduction    Hip internal rotation    Hip external rotation    Knee flexion    Knee extension    Ankle dorsiflexion    Ankle plantarflexion    Ankle inversion    Ankle eversion     (Blank rows = not tested)  LUMBAR SPECIAL TESTS:    FUNCTIONAL TESTS:    GAIT: Distance walked:  Assistive device utilized:  Level of assistance:  Comments:   TREATMENT DATE: 01/02/2024                                                                                                                               Neuromuscular re education  Performed to improve posture and help decrease stress to neck and back  Hooklying   Posterior pelvic tilt 10x10 seconds for 2 sets   Bridge, pain  free ROM, 10x3   Hip extension isometrics, leg straight    R 10x5 seconds for 2 sets    L 10x5 seconds for 2 sets  S/L   Hip abduction    R 5x2   L 5x2   seated  B scapular retraction with chin tuck 10x5 second to promote thoracic extension for 2 sets   Fees good for low back    Cues needed to keep pt in task  Improved exercise technique, movement at target joints, use of target muscles after mod verbal, visual, tactile cues.      PATIENT EDUCATION:  Education details: there-ex, HEP Person educated: Patient Education method: Explanation, Demonstration, Tactile cues, Verbal cues, and Handouts Education comprehension: verbalized understanding and returned demonstration  HOME EXERCISE PROGRAM: Access Code: [email or web address] URL: [email or web address] Date: 12/06/2023 Prepared by: Emil Glassman  Exercises - Seated Cervical Retraction  - 3 x daily - 7 x weekly - 3 sets - 10 reps - 5 seconds  hold - Seated Scapular Retraction  - 3 x daily - 7 x weekly - 3 sets - 10 reps - 5 seconds hold - Seated Shoulder Horizontal Abduction - Thumbs Up  - 3 x daily - 7 x weekly - 3 sets - 10 reps - 5 seconds hold - Shoulder extension with resistance - Neutral  - 1 x daily - 7 x weekly - 3 sets - 10 reps - 5 seconds hold  Green band  - Standing Gluteal Sets  - 3 x daily - 7 x weekly - 3 sets - 10 reps - 5 seconds hold  - Seated Transversus Abdominis Bracing  - 3 x daily - 7 x weekly - 3 sets - 10 reps - 5 seconds hold   Hooklying   Hip extension isometrics, leg straight    R 10x5 seconds for 3 sets   L 10x5 seconds for 3 sets   ASSESSMENT:  CLINICAL IMPRESSION: Worked on thoracic extension, trunk and glute strengthening to decrease extension stress to  low back and neck. Good muscle use reported with exercises. Pt tolerated session well without aggravagtion of symptoms. Pt will benefit from continued skilled physical therapy services to decrease pain, improve strength and  function.     OBJECTIVE IMPAIRMENTS: decreased ROM, decreased strength, improper body mechanics, postural dysfunction, and pain.   ACTIVITY LIMITATIONS: lifting, standing, reach over head, and locomotion level  PARTICIPATION LIMITATIONS:   PERSONAL FACTORS: Age, Fitness, Time since onset of injury/illness/exacerbation, and 3+ comorbidities: Aortic atherosclerosis, atrial fibrillation, depression,  are also affecting patient's functional outcome.   REHAB POTENTIAL: Fair    CLINICAL DECISION MAKING: Evolving/moderate complexity, neck pain is worse since onset per pt  EVALUATION COMPLEXITY: Moderate   GOALS: Goals reviewed with patient? Yes  SHORT TERM GOALS: Target date: 12/13/2023  Pt will be independent with his initial HEP to improve posture, strength, decrease pain, improved function.  Baseline: Pt has not yet started his initial HEP (12/04/2023) Goal status: INITIAL   LONG TERM GOALS: Target date: 01/31/2024  Pt will have a decrease in neck pain to 1/10 or less at worst to promote ability to look up and around more comfortably  Baseline: 3/10 neck pain at most for the past 3 months (12/04/2023) Goal status: INITIAL  2.  Pt will have a decrease in low back pain to 1/10 or less at worst to promote ability to perform standing tasks more comfortably.  Baseline: 3/10 at most for the past 3 months (12/04/2023) Goal status: INITIAL  3.  Pt will improve his Neck Disability Index (NDI) to 4% or less as a demonstration of improved function.  Baseline: Neck Disability Index (NDI) 6/50 (12%) (12/04/2023) Goal status: INITIAL  4.  Pt will improve his B lower trap strength by at least 1/2 MMT grade to promote ability to look around with less neck pain.  Baseline:  MMT Right eval Left eval  Lower trapezius (seated manually resisted) 4 4   Goal status: INITIAL   PLAN:  PT FREQUENCY: 1-2x/week  PT DURATION: 8 weeks  PLANNED INTERVENTIONS: 97110-Therapeutic exercises, 97530-  Therapeutic activity, [id number]- Neuromuscular re-education, 97535- Self Care, [id number]- Manual therapy, [phone]- Gait training, [phone]- Aquatic Therapy, [phone]- Electrical stimulation (unattended), [phone]- Traction (mechanical), [id number]- Ionotophoresis 4mg /ml Dexamethasone , Patient/Family education, Joint mobilization, and Spinal mobilization.  PLAN FOR NEXT SESSION: Posture, thoracic extension, scapular and trunk strengthening, manual techniques, modalities PRN   Dent Plantz, PT, DPT 01/02/2024, 10:21 AM

## 2024-01-06 ENCOUNTER — Ambulatory Visit

## 2024-01-06 DIAGNOSIS — M542 Cervicalgia: Secondary | ICD-10-CM | POA: Diagnosis not present

## 2024-01-06 DIAGNOSIS — M5459 Other low back pain: Secondary | ICD-10-CM

## 2024-01-06 NOTE — Therapy (Signed)
 OUTPATIENT PHYSICAL THERAPY TREATMENT   Patient Name: Francis Jones MRN: [id number] DOB:1951/03/09, 73 y.o., male Today's Date: 01/06/2024  END OF SESSION:  PT End of Session - 01/06/24 0817     Visit Number 5    Number of Visits 17    Date for PT Re-Evaluation 01/31/24    PT Start Time 0817    PT Stop Time 0900    PT Time Calculation (min) 43 min    Activity Tolerance Patient tolerated treatment well    Behavior During Therapy [facility] for tasks assessed/performed             Past Medical History:  Diagnosis Date   Allergic rhinitis    Aortic atherosclerosis (HCC)    Arthritis    Atrial fibrillation (HCC) 12/16/2020   a.) CHA2DS2VASc = 2 (age, vascular disease history);  b.) rate/rhythm maintained on oral metoprolol  succinate; has refused DCCV and chronic oral anticoagulation   Cataract    Coronary artery calcification seen on CT scan    Depression    Diverticulosis    Dyspnea    HLD (hyperlipidemia)    PONV (postoperative nausea and vomiting)    Urothelial carcinoma of bladder (HCC) 02/04/2018   a.) s/p TURBT 02/04/2018: pathology (+) high grade non-invasive urothelial carcinoma (no muscularis propria invasion); b.) s/p TURBT 10/13/2019 --> pathology (+): low grade non-invasive urothelial carcinoma (no muscularis propria invasion); c.) s/p TURBT 12/27/2020: pathology (+) low grade non-invasive urothelial carcinoma (no muscularis propria invasion)   Past Surgical History:  Procedure Laterality Date   APPENDECTOMY  1973   BLADDER INSTILLATION N/A 12/04/2022   Procedure: BLADDER INSTILLATION OF GEMCITABINE ;  Surgeon: Twylla Glendia BROCKS, MD;  Location: [facility] ORS;  Service: Urology;  Laterality: N/A;   COLONOSCOPY N/A 10/21/2023   Procedure: COLONOSCOPY;  Surgeon: Therisa Bi, MD;  Location: [facility] ENDOSCOPY;  Service: Gastroenterology;  Laterality: N/A;   CYSTOSCOPY WITH BIOPSY N/A 12/27/2020   Procedure: CYSTOSCOPY WITH BLADDER BIOPSY;  Surgeon: Twylla Glendia BROCKS, MD;   Location: [facility] ORS;  Service: Urology;  Laterality: N/A;   CYSTOSCOPY WITH FULGERATION N/A 12/27/2020   Procedure: CYSTOSCOPY WITH FULGERATION;  Surgeon: Twylla Glendia BROCKS, MD;  Location: [facility] ORS;  Service: Urology;  Laterality: N/A;   CYSTOSCOPY WITH STENT PLACEMENT Right 02/04/2018   Procedure: CYSTOSCOPY;  Surgeon: Twylla Glendia BROCKS, MD;  Location: [facility] ORS;  Service: Urology;  Laterality: Right;   FINGER SURGERY Left 2001   titanium in left 5th finger   FRACTURE SURGERY     HERNIA REPAIR  2000   SHOULDER SURGERY Left 2009   TRANSURETHRAL RESECTION OF BLADDER TUMOR N/A 02/04/2018   Procedure: TRANSURETHRAL RESECTION OF BLADDER TUMOR (TURBT) with gemcitabine ;  Surgeon: Twylla Glendia BROCKS, MD;  Location: [facility] ORS;  Service: Urology;  Laterality: N/A;   TRANSURETHRAL RESECTION OF BLADDER TUMOR N/A 10/13/2019   Procedure: TRANSURETHRAL RESECTION OF BLADDER TUMOR (TURBT) with gemcitabine ;  Surgeon: Twylla Glendia BROCKS, MD;  Location: [facility] ORS;  Service: Urology;  Laterality: N/A;   TRANSURETHRAL RESECTION OF BLADDER TUMOR N/A 12/04/2022   Procedure: TRANSURETHRAL RESECTION OF BLADDER TUMOR (TURBT);  Surgeon: Twylla Glendia BROCKS, MD;  Location: [facility] ORS;  Service: Urology;  Laterality: N/A;   VASECTOMY  1987   Patient Active Problem List   Diagnosis Date Noted   Aortic atherosclerosis (HCC) 09/24/2023   Permanent atrial fibrillation (HCC) 09/12/2023   Obesity, morbid (HCC) 09/12/2023   Peripheral neuropathy 09/12/2023   Cancer of overlapping sites of bladder (HCC) 01/11/2021   Allergic  rhinitis 12/30/2015   HLD (hyperlipidemia) 12/30/2015    PCP: Donzella Lauraine SAILOR, DO  REFERRING PROVIDER: Donzella Lauraine SAILOR, DO  REFERRING DIAG: M54.2 (ICD-10-CM) - Neck pain M54.50,G89.29 (ICD-10-CM) - Chronic low back pain, unspecified back pain laterality, unspecified whether sciatica present  Rationale for Evaluation and Treatment: Rehabilitation  THERAPY DIAG:  Other low back pain  Cervicalgia  ONSET  DATE: Neck: 2-3 years ago; back: 10 years ago  SUBJECTIVE:                                                                                                                                                                                           SUBJECTIVE STATEMENT: Pt reports mild LBP with laying down. No neck or back pain otherwise. Reports limited rest last night.  PERTINENT HISTORY:  Low back and neck pain. Just got back from interring his wife's ashes. Emotional currently. Pt was in for a medicare physical recently. Unable to put his head back when he lays in supine. Was then referred to PT. Trying to lift his neck up to drink a can of soda bothers his back because he can't get his neck back far enough. Feels like his back pain is also due to his belly. Back pain has been going on for about 10 years, gradual onset. Has B plantar paresthesia, feels like socks are bunched up on his foot. Has R and L anterior lateral thigh paresthesia (lateral femoral cutaneous nerve) when pt sits for a while.   Neck pain located lower cervical spine and posterior upper cervical spine. Neck pain began about 2-3 years ago, gradual onset. Neck bothers him more currently. More of a concern because he feels like his neck pain is getting worse.   No blood pressure problems Has atrial fibrillation, treated with metroprolol  No latex allergy      PAIN:  Are you having pain? Yes: NPRS scale: 0/10 Pain location: lower and upper cervical spine Pain description: pressure, discomfort Aggravating factors: looking up, R cervical rotation Relieving factors: resting position, cervical protraction.   PRECAUTIONS: no known precautions   RED FLAGS: Bowel or bladder incontinence: No and Cauda equina syndrome: No   WEIGHT BEARING RESTRICTIONS: No  FALLS:  Has patient fallen in last 6 months? 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   NEXT MD VISIT: Early August 2025  OBJECTIVE:  Note: Objective  measures were completed at Evaluation unless otherwise noted.  DIAGNOSTIC FINDINGS:    PATIENT SURVEYS:  NDI:  NECK DISABILITY INDEX  Date: 12/04/23 Score  Pain intensity 0 = I have no pain at the moment  2. Personal care (washing, dressing, etc.) 0 = I can look after myself normally without causing extra pain  3. Lifting 0 =  I can lift heavy weights without extra pain  4. Reading 0 = I can read as much as I want to with no pain in my neck  5. Headaches 1 =  I have slight headaches, which come infrequently  6. Concentration 2 = I have a fair degree of difficulty in concentrating when I want to (recently widowed per pt)  7. Work 0 =  I can do as much work as I want to  8. Driving 0 = I can drive my car without any neck pain  9. Sleeping 3 =  My sleep is moderately disturbed (2-3 hrs sleepless)  10. Recreation 0 = I am able to engage in all my recreation activities with no neck pain at all  Total 6/50 (12%)   Minimum Detectable Change (90% confidence): 5 points or 10% points  COGNITION: Overall cognitive status: Within functional limits for tasks assessed     SENSATION:   MUSCLE LENGTH:   POSTURE:  Forward neck, thoracic kyphosis, B protracted shoulders, movement preference around C5/6 and C2/3 area. Backward lean, B genu varus R > L, L low back rotation, R lumbar convexity, R lateral shift   PALPATION: Cervical paraspinal and B upper trap muscle tension   CERVICAL ROM:   Active ROM A/PROM (deg) eval  Flexion WFL  Extension Limited with reproduction of pain  Right lateral flexion Limited with L lateral neck stretch  Left lateral flexion WFL with R lateral neck stretch    Right rotation 55 degrees  (with neck pain with overpressure)  Left rotation 53 degrees (with neck and R upper trap area pain)   (Blank rows = not tested)  UPPER EXTREMITY MMT:  MMT Right eval Left eval  Shoulder flexion 4 4  Shoulder extension    Shoulder abduction    Shoulder adduction    Shoulder extension    Shoulder internal rotation    Shoulder external rotation    Middle trapezius    Lower trapezius (seated manually resisted) 4 4  Elbow flexion 4 4  Elbow extension 5 4  Wrist flexion    Wrist extension 4 4  Wrist ulnar deviation    Wrist radial deviation    Wrist pronation    Wrist supination    Grip strength     (Blank rows = not tested)   LUMBAR ROM:   AROM eval  Flexion   Extension   Right lateral flexion   Left lateral flexion   Right rotation   Left rotation    (Blank rows = not tested)  LOWER EXTREMITY ROM:     Active  Right eval Left eval  Hip flexion    Hip extension    Hip abduction    Hip adduction    Hip internal rotation    Hip external rotation    Knee flexion    Knee extension    Ankle dorsiflexion    Ankle plantarflexion    Ankle inversion    Ankle eversion     (Blank rows = not tested)  LOWER EXTREMITY MMT:    MMT Right eval Left eval  Hip flexion  Hip extension    Hip abduction    Hip adduction    Hip internal rotation    Hip external rotation    Knee flexion    Knee extension    Ankle dorsiflexion    Ankle plantarflexion    Ankle inversion    Ankle eversion     (Blank rows = not tested)  LUMBAR SPECIAL TESTS:    FUNCTIONAL TESTS:    GAIT: Distance walked:  Assistive device utilized:  Level of assistance:  Comments:   TREATMENT DATE: 01/06/2024                                                                                                                               There.Ex: Hooklying   Posterior pelvic tilt 10x10 seconds for 2 sets   Bridge, pain free ROM, 10x3 holding 3 KG med ball  S/L   Hip abduction with 3# AW  at distal femur   R 5x2   L 5x2   Seated  B scapular retraction with chin tuck and GTB 2x12, 5 sec holds.    B low row/shoulder extension with GTB. 2x12  Thoracic extension over 1/2 bolster: 2x12   Improved exercise technique, movement at target joints, use of target muscles after mod verbal, visual, tactile cues.   PATIENT EDUCATION:  Education details: there-ex, HEP Person educated: Patient Education method: Explanation, Demonstration, Tactile cues, Verbal cues, and Handouts Education comprehension: verbalized understanding and returned demonstration  HOME EXERCISE PROGRAM: Access Code: [email or web address] URL: [email or web address] Date: 12/06/2023 Prepared by: Emil Glassman  Exercises - Seated Cervical Retraction  - 3 x daily - 7 x weekly - 3 sets - 10 reps - 5 seconds  hold - Seated Scapular Retraction  - 3 x daily - 7 x weekly - 3 sets - 10 reps - 5 seconds hold - Seated Shoulder Horizontal Abduction - Thumbs Up  - 3 x daily - 7 x weekly - 3 sets - 10 reps - 5 seconds hold - Shoulder extension with resistance - Neutral  - 1 x daily - 7 x weekly - 3 sets - 10 reps - 5 seconds hold  Green band  - Standing Gluteal Sets  - 3 x daily - 7 x weekly - 3 sets - 10 reps - 5 seconds hold  - Seated Transversus Abdominis Bracing  - 3 x daily - 7 x weekly - 3 sets - 10 reps - 5 seconds hold   Hooklying   Hip extension isometrics, leg straight    R 10x5 seconds for 3 sets   L 10x5 seconds for 3 sets   ASSESSMENT:  CLINICAL IMPRESSION: Continuing PT POC working on global spine extension along with global and posterior chain strengthening. Pt provided increased time for rest breaks as needed due to reported fatigue. Able to progress strengthening without onset of pain in lower back or neck throughout session. Pt tolerated session well without aggravagtion of symptoms. Pt will  benefit from continued skilled physical therapy services to decrease pain, improve strength and function.    OBJECTIVE IMPAIRMENTS: decreased ROM, decreased strength, improper body mechanics, postural dysfunction, and pain.   ACTIVITY LIMITATIONS: lifting, standing, reach over head, and locomotion level  PARTICIPATION LIMITATIONS:   PERSONAL FACTORS: Age, Fitness, Time since onset of injury/illness/exacerbation, and 3+ comorbidities: Aortic atherosclerosis, atrial fibrillation, depression,  are also affecting patient's functional outcome.   REHAB POTENTIAL: Fair    CLINICAL DECISION MAKING: Evolving/moderate complexity, neck pain is worse since onset per pt  EVALUATION COMPLEXITY: Moderate   GOALS: Goals reviewed with patient? Yes  SHORT TERM GOALS: Target date: 12/13/2023  Pt will be independent with his initial HEP to improve posture, strength, decrease pain, improved function.  Baseline: Pt has not yet started his initial HEP (12/04/2023) Goal status: INITIAL   LONG TERM GOALS: Target date: 01/31/2024  Pt will have a decrease in neck pain to 1/10 or less at worst to promote ability to look up and around more comfortably  Baseline: 3/10 neck pain at most for the past 3 months (12/04/2023) Goal status: INITIAL  2.  Pt will have a decrease in low back pain to 1/10 or less at worst to promote ability to perform standing tasks more comfortably.  Baseline: 3/10 at most for the past 3 months (12/04/2023) Goal status: INITIAL  3.  Pt will improve his Neck Disability Index (NDI) to 4% or less as a demonstration of improved function.  Baseline: Neck Disability Index (NDI) 6/50 (12%) (12/04/2023) Goal status: INITIAL  4.  Pt will improve his B lower trap strength by at least 1/2 MMT grade to promote ability to look around with less neck pain.  Baseline:  MMT Right eval Left eval  Lower trapezius (seated manually resisted) 4 4   Goal status: INITIAL   PLAN:  PT FREQUENCY: 1-2x/week  PT DURATION: 8 weeks  PLANNED INTERVENTIONS: 97110-Therapeutic exercises, 97530- Therapeutic  activity, [id number]- Neuromuscular re-education, 97535- Self Care, [id number]- Manual therapy, [phone]- Gait training, [phone]- Aquatic Therapy, [phone]- Electrical stimulation (unattended), [phone]- Traction (mechanical), [id number]- Ionotophoresis 4mg /ml Dexamethasone , Patient/Family education, Joint mobilization, and Spinal mobilization.  PLAN FOR NEXT SESSION: Posture, thoracic extension, scapular and trunk strengthening, manual techniques, modalities PRN   Dorina HERO. Fairly IV, PT, DPT Physical Therapist- [place]  [facility] 01/06/2024, 9:05 AM

## 2024-01-07 DIAGNOSIS — L980 Pyogenic granuloma: Secondary | ICD-10-CM | POA: Diagnosis not present

## 2024-01-07 DIAGNOSIS — D485 Neoplasm of uncertain behavior of skin: Secondary | ICD-10-CM | POA: Diagnosis not present

## 2024-01-07 DIAGNOSIS — R208 Other disturbances of skin sensation: Secondary | ICD-10-CM | POA: Diagnosis not present

## 2024-01-08 ENCOUNTER — Ambulatory Visit

## 2024-01-08 DIAGNOSIS — M5459 Other low back pain: Secondary | ICD-10-CM | POA: Diagnosis not present

## 2024-01-08 DIAGNOSIS — M542 Cervicalgia: Secondary | ICD-10-CM | POA: Diagnosis not present

## 2024-01-08 NOTE — Therapy (Signed)
 OUTPATIENT PHYSICAL THERAPY TREATMENT   Patient Name: Francis Jones MRN: [id number] DOB:[birth year], 73 y.o., male Today's Date: 01/08/2024  END OF SESSION:  PT End of Session - 01/08/24 0905     Visit Number 6    Number of Visits 17    Date for PT Re-Evaluation 01/31/24    PT Start Time 0905    PT Stop Time 0945    PT Time Calculation (min) 40 min    Activity Tolerance Patient tolerated treatment well    Behavior During Therapy [facility] for tasks assessed/performed             Past Medical History:  Diagnosis Date   Allergic rhinitis    Aortic atherosclerosis (HCC)    Arthritis    Atrial fibrillation (HCC) 12/16/2020   a.) CHA2DS2VASc = 2 (age, vascular disease history);  b.) rate/rhythm maintained on oral metoprolol  succinate; has refused DCCV and chronic oral anticoagulation   Cataract    Coronary artery calcification seen on CT scan    Depression    Diverticulosis    Dyspnea    HLD (hyperlipidemia)    PONV (postoperative nausea and vomiting)    Urothelial carcinoma of bladder (HCC) 02/04/2018   a.) s/p TURBT 02/04/2018: pathology (+) high grade non-invasive urothelial carcinoma (no muscularis propria invasion); b.) s/p TURBT 10/13/2019 --> pathology (+): low grade non-invasive urothelial carcinoma (no muscularis propria invasion); c.) s/p TURBT 12/27/2020: pathology (+) low grade non-invasive urothelial carcinoma (no muscularis propria invasion)   Past Surgical History:  Procedure Laterality Date   APPENDECTOMY  1973   BLADDER INSTILLATION N/A 12/04/2022   Procedure: BLADDER INSTILLATION OF GEMCITABINE ;  Surgeon: Twylla Glendia BROCKS, MD;  Location: [facility] ORS;  Service: Urology;  Laterality: N/A;   COLONOSCOPY N/A 10/21/2023   Procedure: COLONOSCOPY;  Surgeon: Therisa Bi, MD;  Location: [facility];  Service: Gastroenterology;  Laterality: N/A;   CYSTOSCOPY WITH BIOPSY N/A 12/27/2020   Procedure: CYSTOSCOPY WITH BLADDER BIOPSY;  Surgeon: Twylla Glendia BROCKS, MD;   Location: [facility];  Service: Urology;  Laterality: N/A;   CYSTOSCOPY WITH FULGERATION N/A 12/27/2020   Procedure: CYSTOSCOPY WITH FULGERATION;  Surgeon: Twylla Glendia BROCKS, MD;  Location: [facility];  Service: Urology;  Laterality: N/A;   CYSTOSCOPY WITH STENT PLACEMENT Right 02/04/2018   Procedure: CYSTOSCOPY;  Surgeon: Twylla Glendia BROCKS, MD;  Location: [facility];  Service: Urology;  Laterality: Right;   FINGER SURGERY Left 2001   titanium in left 5th finger   FRACTURE SURGERY     HERNIA REPAIR  2000   SHOULDER SURGERY Left 2009   TRANSURETHRAL RESECTION OF BLADDER TUMOR N/A 02/04/2018   Procedure: TRANSURETHRAL RESECTION OF BLADDER TUMOR (TURBT) with gemcitabine ;  Surgeon: Twylla Glendia BROCKS, MD;  Location: [facility];  Service: Urology;  Laterality: N/A;   TRANSURETHRAL RESECTION OF BLADDER TUMOR N/A 10/13/2019   Procedure: TRANSURETHRAL RESECTION OF BLADDER TUMOR (TURBT) with gemcitabine ;  Surgeon: Twylla Glendia BROCKS, MD;  Location: [facility];  Service: Urology;  Laterality: N/A;   TRANSURETHRAL RESECTION OF BLADDER TUMOR N/A 12/04/2022   Procedure: TRANSURETHRAL RESECTION OF BLADDER TUMOR (TURBT);  Surgeon: Twylla Glendia BROCKS, MD;  Location: [facility];  Service: Urology;  Laterality: N/A;   VASECTOMY  1987   Patient Active Problem List   Diagnosis Date Noted   Aortic atherosclerosis (HCC) 09/24/2023   Permanent atrial fibrillation (HCC) 09/12/2023   Obesity, morbid (HCC) 09/12/2023   Peripheral neuropathy 09/12/2023   Cancer of overlapping sites of bladder (HCC) 01/11/2021   Allergic  rhinitis 12/30/2015   HLD (hyperlipidemia) 12/30/2015    PCP: Donzella Lauraine SAILOR, DO  REFERRING PROVIDER: Donzella Lauraine SAILOR, DO  REFERRING DIAG: M54.2 (ICD-10-CM) - Neck pain M54.50,G89.29 (ICD-10-CM) - Chronic low back pain, unspecified back pain laterality, unspecified whether sciatica present  Rationale for Evaluation and Treatment: Rehabilitation  THERAPY DIAG:  Other low back pain  Cervicalgia  ONSET  DATE: Neck: 2-3 years ago; back: 10 years ago  SUBJECTIVE:                                                                                                                                                                                           SUBJECTIVE STATEMENT: Pt reports no LBP since last session. Had a dermatologist appointment where he had some discomfort in his back from his position he had to lay in.  PERTINENT HISTORY:  Low back and neck pain. Just got back from interring his wife's ashes. Emotional currently. Pt was in for a medicare physical recently. Unable to put his head back when he lays in supine. Was then referred to PT. Trying to lift his neck up to drink a can of soda bothers his back because he can't get his neck back far enough. Feels like his back pain is also due to his belly. Back pain has been going on for about 10 years, gradual onset. Has B plantar paresthesia, feels like socks are bunched up on his foot. Has R and L anterior lateral thigh paresthesia (lateral femoral cutaneous nerve) when pt sits for a while.   Neck pain located lower cervical spine and posterior upper cervical spine. Neck pain began about 2-3 years ago, gradual onset. Neck bothers him more currently. More of a concern because he feels like his neck pain is getting worse.   No blood pressure problems Has atrial fibrillation, treated with metroprolol  No latex allergy      PAIN:  Are you having pain? Yes: NPRS scale: 0/10 Pain location: lower and upper cervical spine Pain description: pressure, discomfort Aggravating factors: looking up, R cervical rotation Relieving factors: resting position, cervical protraction.   PRECAUTIONS: no known precautions   RED FLAGS: Bowel or bladder incontinence: No and Cauda equina syndrome: No   WEIGHT BEARING RESTRICTIONS: No  FALLS:  Has patient fallen in last 6 months? 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   NEXT MD  VISIT: Early August 2025  OBJECTIVE:  Note: Objective measures were completed at Evaluation unless otherwise noted.  DIAGNOSTIC FINDINGS:    PATIENT SURVEYS:  NDI:  NECK DISABILITY INDEX  Date: 12/04/23 Score  Pain intensity 0 = I have no pain at the moment  2. Personal care (washing, dressing, etc.) 0 = I can look after myself normally without causing extra pain  3. Lifting 0 =  I can lift heavy weights without extra pain  4. Reading 0 = I can read as much as I want to with no pain in my neck  5. Headaches 1 =  I have slight headaches, which come infrequently  6. Concentration 2 = I have a fair degree of difficulty in concentrating when I want to (recently widowed per pt)  7. Work 0 =  I can do as much work as I want to  8. Driving 0 = I can drive my car without any neck pain  9. Sleeping 3 =  My sleep is moderately disturbed (2-3 hrs sleepless)  10. Recreation 0 = I am able to engage in all my recreation activities with no neck pain at all  Total 6/50 (12%)   Minimum Detectable Change (90% confidence): 5 points or 10% points  COGNITION: Overall cognitive status: Within functional limits for tasks assessed     SENSATION:   MUSCLE LENGTH:   POSTURE:  Forward neck, thoracic kyphosis, B protracted shoulders, movement preference around C5/6 and C2/3 area. Backward lean, B genu varus R > L, L low back rotation, R lumbar convexity, R lateral shift   PALPATION: Cervical paraspinal and B upper trap muscle tension   CERVICAL ROM:   Active ROM A/PROM (deg) eval  Flexion WFL  Extension Limited with reproduction of pain  Right lateral flexion Limited with L lateral neck stretch  Left lateral flexion  WFL with R lateral neck stretch    Right rotation 55 degrees (with neck pain with overpressure)  Left rotation 53 degrees (with neck and R upper trap area pain)   (Blank rows = not tested)  UPPER EXTREMITY MMT:  MMT Right eval Left eval  Shoulder flexion 4 4  Shoulder extension    Shoulder abduction    Shoulder adduction    Shoulder extension    Shoulder internal rotation    Shoulder external rotation    Middle trapezius    Lower trapezius (seated manually resisted) 4 4  Elbow flexion 4 4  Elbow extension 5 4  Wrist flexion    Wrist extension 4 4  Wrist ulnar deviation    Wrist radial deviation    Wrist pronation    Wrist supination    Grip strength     (Blank rows = not tested)   LUMBAR ROM:   AROM eval  Flexion   Extension   Right lateral flexion   Left lateral flexion   Right rotation   Left rotation    (Blank rows = not tested)  LOWER EXTREMITY ROM:     Active  Right eval Left eval  Hip flexion    Hip extension    Hip abduction    Hip adduction    Hip internal rotation    Hip external rotation    Knee flexion    Knee extension    Ankle dorsiflexion    Ankle plantarflexion    Ankle inversion    Ankle eversion     (Blank rows = not tested)  LOWER EXTREMITY MMT:  MMT Right eval Left eval  Hip flexion    Hip extension    Hip abduction    Hip adduction    Hip internal rotation    Hip external rotation    Knee flexion    Knee extension    Ankle dorsiflexion    Ankle plantarflexion    Ankle inversion    Ankle eversion     (Blank rows = not tested)  LUMBAR SPECIAL TESTS:    FUNCTIONAL TESTS:    GAIT: Distance walked:  Assistive device utilized:  Level of assistance:  Comments:   TREATMENT DATE: 01/08/2024                                                                                                                               There.Ex:  Hook lying   Posterior pelvic tilt 2x10, 10 sec holds   Bridge, pain free ROM,  10x3 holding 3 KG med ball  S/L   Hip abduction with 3# AW at distal femur   R: 3x5   L: 3x5. Decreased ROM on LLE > RLE likely due to some weakness.    Seated  B scapular retraction with chin tuck and GTB 2x12, 5 sec holds.    B low row/shoulder extension with GTB. 2x12  Improved exercise technique, movement at target joints, use of target muscles after mod verbal, visual, tactile cues.   PATIENT EDUCATION:  Education details: there-ex, HEP Person educated: Patient Education method: Explanation, Demonstration, Tactile cues, Verbal cues, and Handouts Education comprehension: verbalized understanding and returned demonstration  HOME EXERCISE PROGRAM: Access Code: [email or web address] URL: [email or web address] Date: 12/06/2023 Prepared by: Emil Glassman  Exercises - Seated Cervical Retraction  - 3 x daily - 7 x weekly - 3 sets - 10 reps - 5 seconds  hold - Seated Scapular Retraction  - 3 x daily - 7 x weekly - 3 sets - 10 reps - 5 seconds hold - Seated Shoulder Horizontal Abduction - Thumbs Up  - 3 x daily - 7 x weekly - 3 sets - 10 reps - 5 seconds hold - Shoulder extension with resistance - Neutral  - 1 x daily - 7 x weekly - 3 sets - 10 reps - 5 seconds hold  Green band  - Standing Gluteal Sets  - 3 x daily - 7 x weekly - 3 sets - 10 reps - 5 seconds hold  - Seated Transversus Abdominis Bracing  - 3 x daily - 7 x weekly - 3 sets - 10 reps - 5 seconds hold   Hooklying   Hip extension isometrics, leg straight    R 10x5 seconds for 3 sets   L 10x5 seconds for 3 sets   ASSESSMENT:  CLINICAL IMPRESSION: Continuing PT POC working on global spine extension along with global and posterior chain strengthening. Progressed glut med strength today with AW positioning more distal and overall greater volume. Continuing POC working on progressive overload to posterior  chain to assist in long term pain relief. t tolerated session well without aggravagtion of symptoms. Pt will  benefit from continued skilled physical therapy services to decrease pain, improve strength and function.   OBJECTIVE IMPAIRMENTS: decreased ROM, decreased strength, improper body mechanics, postural dysfunction, and pain.   ACTIVITY LIMITATIONS: lifting, standing, reach over head, and locomotion level  PARTICIPATION LIMITATIONS:   PERSONAL FACTORS: Age, Fitness, Time since onset of injury/illness/exacerbation, and 3+ comorbidities: Aortic atherosclerosis, atrial fibrillation, depression,  are also affecting patient's functional outcome.   REHAB POTENTIAL: Fair    CLINICAL DECISION MAKING: Evolving/moderate complexity, neck pain is worse since onset per pt  EVALUATION COMPLEXITY: Moderate   GOALS: Goals reviewed with patient? Yes  SHORT TERM GOALS: Target date: 12/13/2023  Pt will be independent with his initial HEP to improve posture, strength, decrease pain, improved function.  Baseline: Pt has not yet started his initial HEP (12/04/2023) Goal status: INITIAL   LONG TERM GOALS: Target date: 01/31/2024  Pt will have a decrease in neck pain to 1/10 or less at worst to promote ability to look up and around more comfortably  Baseline: 3/10 neck pain at most for the past 3 months (12/04/2023) Goal status: INITIAL  2.  Pt will have a decrease in low back pain to 1/10 or less at worst to promote ability to perform standing tasks more comfortably.  Baseline: 3/10 at most for the past 3 months (12/04/2023) Goal status: INITIAL  3.  Pt will improve his Neck Disability Index (NDI) to 4% or less as a demonstration of improved function.  Baseline: Neck Disability Index (NDI) 6/50 (12%) (12/04/2023) Goal status: INITIAL  4.  Pt will improve his B lower trap strength by at least 1/2 MMT grade to promote ability to look around with less neck pain.  Baseline:  MMT Right eval Left eval  Lower trapezius (seated manually resisted) 4 4   Goal status: INITIAL   PLAN:  PT FREQUENCY:  1-2x/week  PT DURATION: 8 weeks  PLANNED INTERVENTIONS: 97110-Therapeutic exercises, 97530- Therapeutic activity, [id number]- Neuromuscular re-education, 97535- Self Care, [id number]- Manual therapy, [phone]- Gait training, [phone]- Aquatic Therapy, [phone]- Electrical stimulation (unattended), [phone]- Traction (mechanical), [id number]- Ionotophoresis 4mg /ml Dexamethasone , Patient/Family education, Joint mobilization, and Spinal mobilization.  PLAN FOR NEXT SESSION: Posture, thoracic extension, scapular and trunk strengthening, manual techniques, modalities PRN   Dorina HERO. Fairly IV, PT, DPT Physical Therapist- [place]  [facility] 01/08/2024, 11:13 AM

## 2024-01-12 LAB — GENECONNECT MOLECULAR SCREEN: Genetic Analysis Overall Interpretation: NEGATIVE

## 2024-01-13 ENCOUNTER — Ambulatory Visit: Admitting: Family Medicine

## 2024-01-13 ENCOUNTER — Encounter: Payer: Self-pay | Admitting: Family Medicine

## 2024-01-13 ENCOUNTER — Ambulatory Visit: Attending: Family Medicine

## 2024-01-13 VITALS — BP 118/84 | HR 84 | Ht 72.0 in | Wt 277.4 lb

## 2024-01-13 DIAGNOSIS — R5382 Chronic fatigue, unspecified: Secondary | ICD-10-CM | POA: Diagnosis not present

## 2024-01-13 DIAGNOSIS — M5459 Other low back pain: Secondary | ICD-10-CM | POA: Insufficient documentation

## 2024-01-13 DIAGNOSIS — I4821 Permanent atrial fibrillation: Secondary | ICD-10-CM

## 2024-01-13 DIAGNOSIS — R7309 Other abnormal glucose: Secondary | ICD-10-CM

## 2024-01-13 DIAGNOSIS — I700 Atherosclerosis of aorta: Secondary | ICD-10-CM

## 2024-01-13 DIAGNOSIS — N529 Male erectile dysfunction, unspecified: Secondary | ICD-10-CM | POA: Diagnosis not present

## 2024-01-13 DIAGNOSIS — M542 Cervicalgia: Secondary | ICD-10-CM | POA: Diagnosis not present

## 2024-01-13 MED ORDER — ROSUVASTATIN CALCIUM 5 MG PO TABS: 5.0000 mg | ORAL_TABLET | Freq: Every day | ORAL | 3 refills | Status: AC

## 2024-01-13 NOTE — Therapy (Signed)
 OUTPATIENT PHYSICAL THERAPY TREATMENT   Patient Name: Francis Jones MRN: [id number] DOB:[birth year], 73 y.o., male Today's Date: 01/13/2024  END OF SESSION:  PT End of Session - 01/13/24 1349     Visit Number 7    Number of Visits 17    Date for PT Re-Evaluation 01/31/24    PT Start Time 1349    PT Stop Time 1429    PT Time Calculation (min) 40 min    Activity Tolerance Patient tolerated treatment well    Behavior During Therapy [facility] for tasks assessed/performed              Past Medical History:  Diagnosis Date   Allergic rhinitis    Anxiety 2023-08-30   Death of spouse   Aortic atherosclerosis (HCC)    Arthritis    Atrial fibrillation (HCC) 12/16/2020   a.) CHA2DS2VASc = 2 (age, vascular disease history);  b.) rate/rhythm maintained on oral metoprolol  succinate; has refused DCCV and chronic oral anticoagulation   Cataract    Coronary artery calcification seen on CT scan    Depression    Diverticulosis    Dyspnea    HLD (hyperlipidemia)    PONV (postoperative nausea and vomiting)    Urothelial carcinoma of bladder (HCC) 02/04/2018   a.) s/p TURBT 02/04/2018: pathology (+) high grade non-invasive urothelial carcinoma (no muscularis propria invasion); b.) s/p TURBT 10/13/2019 --> pathology (+): low grade non-invasive urothelial carcinoma (no muscularis propria invasion); c.) s/p TURBT 12/27/2020: pathology (+) low grade non-invasive urothelial carcinoma (no muscularis propria invasion)   Past Surgical History:  Procedure Laterality Date   APPENDECTOMY  1973   BLADDER INSTILLATION N/A 12/04/2022   Procedure: BLADDER INSTILLATION OF GEMCITABINE ;  Surgeon: Twylla Glendia BROCKS, MD;  Location: [facility];  Service: Urology;  Laterality: N/A;   COLONOSCOPY N/A 10/21/2023   Procedure: COLONOSCOPY;  Surgeon: Therisa Bi, MD;  Location: [facility];  Service: Gastroenterology;  Laterality: N/A;   CYSTOSCOPY WITH BIOPSY N/A 12/27/2020   Procedure: CYSTOSCOPY WITH BLADDER BIOPSY;   Surgeon: Twylla Glendia BROCKS, MD;  Location: [facility];  Service: Urology;  Laterality: N/A;   CYSTOSCOPY WITH FULGERATION N/A 12/27/2020   Procedure: CYSTOSCOPY WITH FULGERATION;  Surgeon: Twylla Glendia BROCKS, MD;  Location: [facility];  Service: Urology;  Laterality: N/A;   CYSTOSCOPY WITH STENT PLACEMENT Right 02/04/2018   Procedure: CYSTOSCOPY;  Surgeon: Twylla Glendia BROCKS, MD;  Location: [facility];  Service: Urology;  Laterality: Right;   FINGER SURGERY Left 2001   titanium in left 5th finger   FRACTURE SURGERY     HERNIA REPAIR  2000   SHOULDER SURGERY Left 2009   TRANSURETHRAL RESECTION OF BLADDER TUMOR N/A 02/04/2018   Procedure: TRANSURETHRAL RESECTION OF BLADDER TUMOR (TURBT) with gemcitabine ;  Surgeon: Twylla Glendia BROCKS, MD;  Location: [facility];  Service: Urology;  Laterality: N/A;   TRANSURETHRAL RESECTION OF BLADDER TUMOR N/A 10/13/2019   Procedure: TRANSURETHRAL RESECTION OF BLADDER TUMOR (TURBT) with gemcitabine ;  Surgeon: Twylla Glendia BROCKS, MD;  Location: [facility];  Service: Urology;  Laterality: N/A;   TRANSURETHRAL RESECTION OF BLADDER TUMOR N/A 12/04/2022   Procedure: TRANSURETHRAL RESECTION OF BLADDER TUMOR (TURBT);  Surgeon: Twylla Glendia BROCKS, MD;  Location: [facility];  Service: Urology;  Laterality: N/A;   VASECTOMY  1987   Patient Active Problem List   Diagnosis Date Noted   Aortic atherosclerosis (HCC) 09/24/2023   Permanent atrial fibrillation (HCC) 09/12/2023   Obesity, morbid (HCC) 09/12/2023   Peripheral neuropathy 09/12/2023   Cancer  of overlapping sites of bladder (HCC) 01/11/2021   Allergic rhinitis 12/30/2015   HLD (hyperlipidemia) 12/30/2015    PCP: Donzella Lauraine SAILOR, DO  REFERRING PROVIDER: Donzella Lauraine SAILOR, DO  REFERRING DIAG: M54.2 (ICD-10-CM) - Neck pain M54.50,G89.29 (ICD-10-CM) - Chronic low back pain, unspecified back pain laterality, unspecified whether sciatica present  Rationale for Evaluation and Treatment: Rehabilitation  THERAPY DIAG:  Other low back  pain  Cervicalgia  ONSET DATE: Neck: 2-3 years ago; back: 10 years ago  SUBJECTIVE:                                                                                                                                                                                           SUBJECTIVE STATEMENT: No neck pain currently. No back pain currently. Standing bent over for a long time, his back bothers him.   3-4/10 neck and 5/10 low back pain at worst for the past week.      PERTINENT HISTORY:  Low back and neck pain. Just got back from interring his wife's ashes. Emotional currently. Pt was in for a medicare physical recently. Unable to put his head back when he lays in supine. Was then referred to PT. Trying to lift his neck up to drink a can of soda bothers his back because he can't get his neck back far enough. Feels like his back pain is also due to his belly. Back pain has been going on for about 10 years, gradual onset. Has B plantar paresthesia, feels like socks are bunched up on his foot. Has R and L anterior lateral thigh paresthesia (lateral femoral cutaneous nerve) when pt sits for a while.   Neck pain located lower cervical spine and posterior upper cervical spine. Neck pain began about 2-3 years ago, gradual onset. Neck bothers him more currently. More of a concern because he feels like his neck pain is getting worse.   No blood pressure problems Has atrial fibrillation, treated with metroprolol  No latex allergy      PAIN:  Are you having pain? Yes: NPRS scale: 0/10 Pain location: lower and upper cervical spine Pain description: pressure, discomfort Aggravating factors: looking up, R cervical rotation Relieving factors: resting position, cervical protraction.   PRECAUTIONS: no known precautions   RED FLAGS: Bowel or bladder incontinence: No and Cauda equina syndrome: No   WEIGHT BEARING RESTRICTIONS: No  FALLS:  Has patient fallen in last 6 months? 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   NEXT MD VISIT: Early August 2025  OBJECTIVE:  Note: Objective measures were completed at Evaluation unless otherwise noted.  DIAGNOSTIC FINDINGS:    PATIENT SURVEYS:  NDI:  NECK DISABILITY INDEX  Date: 12/04/23 Score  Pain intensity 0 = I have no pain at the moment  2. Personal care (washing, dressing, etc.) 0 = I can look after myself normally without causing extra pain  3. Lifting 0 =  I can lift heavy weights without extra pain  4. Reading 0 = I can read as much as I want to with no pain in my neck  5. Headaches 1 =  I have slight headaches, which come infrequently  6. Concentration 2 = I have a fair degree of difficulty in concentrating when I want to (recently widowed per pt)  7. Work 0 =  I can do as much work as I want to  8. Driving 0 = I can drive my car without any neck pain  9. Sleeping 3 =  My sleep is moderately disturbed (2-3 hrs sleepless)  10. Recreation 0 = I am able to engage in all my recreation activities with no neck pain at all  Total 6/50 (12%)   Minimum Detectable Change (90% confidence): 5 points or 10% points  COGNITION: Overall cognitive status: Within functional limits for tasks assessed     SENSATION:   MUSCLE LENGTH:   POSTURE:  Forward neck, thoracic kyphosis, B protracted shoulders, movement preference around C5/6 and C2/3 area. Backward lean, B genu varus R > L, L low back rotation, R lumbar convexity, R lateral shift   PALPATION: Cervical paraspinal and B upper trap muscle tension   CERVICAL ROM:   Active ROM A/PROM (deg) eval  Flexion WFL  Extension Limited with reproduction of pain  Right lateral flexion Limited with  L lateral neck stretch  Left lateral flexion WFL with R lateral neck stretch    Right rotation 55 degrees (with neck pain with overpressure)  Left rotation 53 degrees (with neck and R upper trap area pain)   (Blank rows = not tested)  UPPER EXTREMITY MMT:  MMT Right eval Left eval  Shoulder flexion 4 4  Shoulder extension    Shoulder abduction    Shoulder adduction    Shoulder extension    Shoulder internal rotation    Shoulder external rotation    Middle trapezius    Lower trapezius (seated manually resisted) 4 4  Elbow flexion 4 4  Elbow extension 5 4  Wrist flexion    Wrist extension 4 4  Wrist ulnar deviation    Wrist radial deviation    Wrist pronation    Wrist supination    Grip strength     (Blank rows = not tested)   LUMBAR ROM:   AROM eval  Flexion   Extension   Right lateral flexion   Left lateral flexion   Right rotation   Left rotation    (Blank rows = not tested)  LOWER EXTREMITY ROM:     Active  Right eval Left eval  Hip flexion    Hip extension    Hip abduction    Hip adduction    Hip internal rotation    Hip external rotation    Knee flexion    Knee extension    Ankle dorsiflexion    Ankle plantarflexion    Ankle inversion  Ankle eversion     (Blank rows = not tested)  LOWER EXTREMITY MMT:    MMT Right eval Left eval  Hip flexion    Hip extension    Hip abduction    Hip adduction    Hip internal rotation    Hip external rotation    Knee flexion    Knee extension    Ankle dorsiflexion    Ankle plantarflexion    Ankle inversion    Ankle eversion     (Blank rows = not tested)  LUMBAR SPECIAL TESTS:    FUNCTIONAL TESTS:    GAIT: Distance walked:  Assistive device utilized:  Level of assistance:  Comments:   TREATMENT DATE: 01/13/2024                                                                                                                               There.Ex:  Hook lying   Posterior pelvic tilt  2x10, 10 sec holds   Bridge, pain free ROM, 10x3 holding 3 KG med ball  Supine with leg straight 10x2 with 5 second holds  B shoulder flexion to promote thoracic and gentle lumbar extension   Standing hip abduction with B UE assist   R 10x2  L 10x2  Cues needed to keep pt in task   Improved exercise technique, movement at target joints, use of target muscles after mod verbal, visual, tactile cues.   PATIENT EDUCATION:  Education details: there-ex, HEP Person educated: Patient Education method: Explanation, Demonstration, Tactile cues, Verbal cues, and Handouts Education comprehension: verbalized understanding and returned demonstration  HOME EXERCISE PROGRAM: Access Code: [email or web address] URL: [email or web address] Date: 12/06/2023 Prepared by: Emil Glassman  Exercises - Seated Cervical Retraction  - 3 x daily - 7 x weekly - 3 sets - 10 reps - 5 seconds  hold - Seated Scapular Retraction  - 3 x daily - 7 x weekly - 3 sets - 10 reps - 5 seconds hold - Seated Shoulder Horizontal Abduction - Thumbs Up  - 3 x daily - 7 x weekly - 3 sets - 10 reps - 5 seconds hold - Shoulder extension with resistance - Neutral  - 1 x daily - 7 x weekly - 3 sets - 10 reps - 5 seconds hold  Green band  - Standing Gluteal Sets  - 3 x daily - 7 x weekly - 3 sets - 10 reps - 5 seconds hold  - Seated Transversus Abdominis Bracing  - 3 x daily - 7 x weekly - 3 sets - 10 reps - 5 seconds hold   Hooklying   Hip extension isometrics, leg straight    R 10x5 seconds for 3 sets   L 10x5 seconds for 3 sets  - Supine Shoulder Flexion Extension Full Range AROM  - 1 x daily - 7 x weekly - 3 sets - 10 reps - 5 seconds hold - Standing Hip Abduction with Counter Support  -  1 x daily - 7 x weekly - 3 sets - 10 reps     ASSESSMENT:  CLINICAL IMPRESSION: Cotninued working on thoracic extension, trunk and glute strength to decrease stress to his neck and back. Pt tolerated session well without  aggravagtion of symptoms. Pt will benefit from continued skilled physical therapy services to decrease pain, improve strength and function.      OBJECTIVE IMPAIRMENTS: decreased ROM, decreased strength, improper body mechanics, postural dysfunction, and pain.   ACTIVITY LIMITATIONS: lifting, standing, reach over head, and locomotion level  PARTICIPATION LIMITATIONS:   PERSONAL FACTORS: Age, Fitness, Time since onset of injury/illness/exacerbation, and 3+ comorbidities: Aortic atherosclerosis, atrial fibrillation, depression,  are also affecting patient's functional outcome.   REHAB POTENTIAL: Fair    CLINICAL DECISION MAKING: Evolving/moderate complexity, neck pain is worse since onset per pt  EVALUATION COMPLEXITY: Moderate   GOALS: Goals reviewed with patient? Yes  SHORT TERM GOALS: Target date: 12/13/2023  Pt will be independent with his initial HEP to improve posture, strength, decrease pain, improved function.  Baseline: Pt has not yet started his initial HEP (12/04/2023) Goal status: INITIAL   LONG TERM GOALS: Target date: 01/31/2024  Pt will have a decrease in neck pain to 1/10 or less at worst to promote ability to look up and around more comfortably  Baseline: 3/10 neck pain at most for the past 3 months (12/04/2023) Goal status: INITIAL  2.  Pt will have a decrease in low back pain to 1/10 or less at worst to promote ability to perform standing tasks more comfortably.  Baseline: 3/10 at most for the past 3 months (12/04/2023) Goal status: INITIAL  3.  Pt will improve his Neck Disability Index (NDI) to 4% or less as a demonstration of improved function.  Baseline: Neck Disability Index (NDI) 6/50 (12%) (12/04/2023) Goal status: INITIAL  4.  Pt will improve his B lower trap strength by at least 1/2 MMT grade to promote ability to look around with less neck pain.  Baseline:  MMT Right eval Left eval  Lower trapezius (seated manually resisted) 4 4   Goal status:  INITIAL   PLAN:  PT FREQUENCY: 1-2x/week  PT DURATION: 8 weeks  PLANNED INTERVENTIONS: 97110-Therapeutic exercises, 97530- Therapeutic activity, [id number]- Neuromuscular re-education, 97535- Self Care, [id number]- Manual therapy, [phone]- Gait training, [phone]- Aquatic Therapy, [phone]- Electrical stimulation (unattended), [phone]- Traction (mechanical), [id number]- Ionotophoresis 4mg /ml Dexamethasone , Patient/Family education, Joint mobilization, and Spinal mobilization.  PLAN FOR NEXT SESSION: Posture, thoracic extension, scapular and trunk strengthening, manual techniques, modalities PRN   Ramari Bray, PT, DPT Physical Therapist- [facility]  [facility] 01/13/2024, 4:30 PM

## 2024-01-13 NOTE — Progress Notes (Signed)
 Established patient visit   Patient: Francis Jones   DOB: 04/19/1951   73 y.o. Male  MRN: [id number] Visit Date: 01/13/2024  Today's healthcare provider: LAURAINE LOISE BUOY, DO   Chief Complaint  Patient presents with   Medical Management of Chronic Issues    Pt reports he would like to know if anything in product would cause him any harm called cardio miracle.Would also like to know about 5 hour energy and wants to know if they are causing him any harm if he is drinking them. He reports drinking them once in 6 months. He would like to see about having a hormone panel completed. Wanting to know if product NP for thyroids is better than medication she is being prescribed   Hypertension    Does not monitor and reports no symptoms    Subjective    HPIJames L Colton Jones is a 73 year old male with atrial fibrillation who presents with questions about supplement interactions and hormone panel testing.  He is concerned about the safety of taking certain supplements recommended by a friend, particularly regarding potential interactions with his current medication, metoprolol , which he takes for atrial fibrillation. The supplements include a nitric oxide booster and other ingredients, and he wants to ensure they will not cause harm.  He occasionally uses five-hour energy drinks to stay awake during long drives. These drinks help him stay alert without causing jitters or shakes. He is concerned about the potential impact of the caffeine and vitamin B12 content on his heart condition, particularly in relation to his atrial fibrillation.  He has a history of erectile dysfunction, which he believes began around the time his late wife was diagnosed with cancer. He attributes the onset of erectile dysfunction to both psychological factors and potential hormonal changes. He is interested in having a hormone panel done to investigate possible hormonal imbalances, particularly concerning testosterone   levels.  He mentions that his brother-in-law is currently in hospice care. 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  His current lifestyle includes a poor diet with limited vegetables and an increase in processed foods, which he attributes to recent life changes and stress. He acknowledges drinking more wine than usual but has since reduced his intake due to concerns about addiction.      Medications: Outpatient Medications Prior to Visit  Medication Sig   aspirin  EC 81 MG tablet Take 81 mg by mouth daily. Swallow whole.   metoprolol  succinate (TOPROL  XL) 25 MG 24 hr tablet Take 1 tablet (25 mg total) by mouth daily.   tadalafil  (CIALIS ) 20 MG tablet 1 TAB 1 HOUR PRIOR TO INTERCOURSE   No facility-administered medications prior to visit.        Objective    BP 118/84 (BP Location: Right Arm, Patient Position: Sitting, Cuff Size: Large)   Pulse 84   Ht 6' (1.829 m)   Wt 277 lb 6.4 oz (125.8 kg)   SpO2 97%   BMI 37.62 kg/m     Physical Exam Vitals and nursing note reviewed.  Constitutional:      General: He is not in acute distress.    Appearance: Normal appearance.  HENT:     Head: Normocephalic and atraumatic.  Eyes:     General: No scleral icterus.    Conjunctiva/sclera: Conjunctivae normal.  Cardiovascular:  Rate and Rhythm: Normal rate.  Pulmonary:     Effort: Pulmonary effort is normal.  Neurological:     Mental Status: He is alert and oriented to person, place, and time. Mental status is at baseline.  Psychiatric:        Mood and Affect: Mood normal.        Behavior: Behavior normal.      No results found for any visits on 01/13/24.  Assessment & Plan    Chronic fatigue -     Testosterone , Free, Total, SHBG -     FSH/LH -     Estradiol  -     CBC -     Prolactin  Erectile dysfunction, unspecified erectile  dysfunction type -     Testosterone , Free, Total, SHBG -     FSH/LH -     Estradiol  -     Prolactin  Permanent atrial fibrillation (HCC)  Aortic atherosclerosis (HCC) -     Rosuvastatin  Calcium ; Take 1 tablet (5 mg total) by mouth daily.  Dispense: 90 tablet; Refill: 3  Elevated hemoglobin A1c -     Hemoglobin A1c      Permanent atrial fibrillation Chronic atrial fibrillation with fatigue managed with metoprolol  XL 25 mg daily. Discussed impact on oxygen delivery and fatigue.    Chronic fatigue Chronic fatigue related to atrial fibrillation and deconditioning. Discussed hormonal imbalances and fitness impact. - Order hormone panel. - Order CBC.  Aortic atherosclerosis; hyperlipidemia ASCVD risk 24% over ten years.  Discussed statin therapy benefits and risks. He is open to statin therapy. - Initiate low-dose statin therapy to assess response.  Elevated A1c Previous A1c elevated. Discussed medication versus lifestyle modifications, as well as option of more relaxed versus more aggressive A1c goal, given his advanced age. Emphasized diet and exercise. - Order A1c test. - Encourage diet and exercise modifications.  Erectile dysfunction Chronic erectile dysfunction with potential psychological components related to late-partner's cancer diagnosis.  Currently on tadalafil  20 mg daily as needed, prescribed by his urologist.  This has not been effective for him. - Will evaluate hormones as noted. - Follows with urology; defer to specialist management.  General Health Maintenance Evaluated supplements and energy drinks for interactions with metoprolol . Discussed caffeine content in energy drinks. He is cautious due to potential interactions and addictive personality. Discussed that the supplements he is considering may have some impact on the concentrations of medications but they do not appear to be overly concerning as a whole, as they seem to primarily affect cytochrome P450  enzymes other than those metoprolol  is metabolized by.  Discussed that he should not take more than one pack of the Cardiac Miracle he is considering taking per day, despite the instructions allowing for more.  The 5-hour energy drink shot, with use as sporadic as he reports of approximately twice per year, should also be reasonably safe. - Advise caution with supplement use and energy drinks. - Monitor for any adverse effects from supplements or energy drinks.    Return in about 3 months (around 04/14/2024) for Chronic f/u.      I discussed the assessment and treatment plan with the patient  The patient was provided an opportunity to ask questions and all were answered. The patient agreed with the plan and demonstrated an understanding of the instructions.   The patient was advised to call back or seek an in-person evaluation if the symptoms worsen or if the condition fails to improve as anticipated.  Total time was  40 minutes. That includes chart review before the visit, the actual patient visit, and time spent on documentation after the visit.     LAURAINE LOISE BUOY, DO  [facility] [phone] (phone) [phone] (fax)  [facility]

## 2024-01-14 ENCOUNTER — Ambulatory Visit

## 2024-01-16 ENCOUNTER — Ambulatory Visit

## 2024-01-16 DIAGNOSIS — M542 Cervicalgia: Secondary | ICD-10-CM | POA: Diagnosis not present

## 2024-01-16 DIAGNOSIS — M5459 Other low back pain: Secondary | ICD-10-CM

## 2024-01-16 NOTE — Therapy (Signed)
 OUTPATIENT PHYSICAL THERAPY TREATMENT And Discharge Summary   Patient Name: Francis Jones MRN: [id number] DOB:August 07, 1950, 73 y.o., male Today's Date: 01/16/2024  END OF SESSION:  PT End of Session - 01/16/24 1350     Visit Number 8    Number of Visits 17    Date for PT Re-Evaluation 01/31/24    PT Start Time 1350    PT Stop Time 1429    PT Time Calculation (min) 39 min    Activity Tolerance Patient tolerated treatment well    Behavior During Therapy [facility] for tasks assessed/performed               Past Medical History:  Diagnosis Date   Allergic rhinitis    Anxiety 08/08/2023   Death of spouse   Aortic atherosclerosis (HCC)    Arthritis    Atrial fibrillation (HCC) 12/16/2020   a.) CHA2DS2VASc = 2 (age, vascular disease history);  b.) rate/rhythm maintained on oral metoprolol  succinate; has refused DCCV and chronic oral anticoagulation   Cataract    Coronary artery calcification seen on CT scan    Depression    Diverticulosis    Dyspnea    HLD (hyperlipidemia)    PONV (postoperative nausea and vomiting)    Urothelial carcinoma of bladder (HCC) 02/04/2018   a.) s/p TURBT 02/04/2018: pathology (+) high grade non-invasive urothelial carcinoma (no muscularis propria invasion); b.) s/p TURBT 10/13/2019 --> pathology (+): low grade non-invasive urothelial carcinoma (no muscularis propria invasion); c.) s/p TURBT 12/27/2020: pathology (+) low grade non-invasive urothelial carcinoma (no muscularis propria invasion)   Past Surgical History:  Procedure Laterality Date   APPENDECTOMY  1973   BLADDER INSTILLATION N/A 12/04/2022   Procedure: BLADDER INSTILLATION OF GEMCITABINE ;  Surgeon: Twylla Glendia BROCKS, MD;  Location: [facility] ORS;  Service: Urology;  Laterality: N/A;   COLONOSCOPY N/A 10/21/2023   Procedure: COLONOSCOPY;  Surgeon: Therisa Bi, MD;  Location: [facility] ENDOSCOPY;  Service: Gastroenterology;  Laterality: N/A;   CYSTOSCOPY WITH BIOPSY N/A 12/27/2020   Procedure:  CYSTOSCOPY WITH BLADDER BIOPSY;  Surgeon: Twylla Glendia BROCKS, MD;  Location: [facility] ORS;  Service: Urology;  Laterality: N/A;   CYSTOSCOPY WITH FULGERATION N/A 12/27/2020   Procedure: CYSTOSCOPY WITH FULGERATION;  Surgeon: Twylla Glendia BROCKS, MD;  Location: [facility] ORS;  Service: Urology;  Laterality: N/A;   CYSTOSCOPY WITH STENT PLACEMENT Right 02/04/2018   Procedure: CYSTOSCOPY;  Surgeon: Twylla Glendia BROCKS, MD;  Location: [facility] ORS;  Service: Urology;  Laterality: Right;   FINGER SURGERY Left 2001   titanium in left 5th finger   FRACTURE SURGERY     HERNIA REPAIR  2000   SHOULDER SURGERY Left 2009   TRANSURETHRAL RESECTION OF BLADDER TUMOR N/A 02/04/2018   Procedure: TRANSURETHRAL RESECTION OF BLADDER TUMOR (TURBT) with gemcitabine ;  Surgeon: Twylla Glendia BROCKS, MD;  Location: [facility] ORS;  Service: Urology;  Laterality: N/A;   TRANSURETHRAL RESECTION OF BLADDER TUMOR N/A 10/13/2019   Procedure: TRANSURETHRAL RESECTION OF BLADDER TUMOR (TURBT) with gemcitabine ;  Surgeon: Twylla Glendia BROCKS, MD;  Location: [facility] ORS;  Service: Urology;  Laterality: N/A;   TRANSURETHRAL RESECTION OF BLADDER TUMOR N/A 12/04/2022   Procedure: TRANSURETHRAL RESECTION OF BLADDER TUMOR (TURBT);  Surgeon: Twylla Glendia BROCKS, MD;  Location: [facility] ORS;  Service: Urology;  Laterality: N/A;   VASECTOMY  1987   Patient Active Problem List   Diagnosis Date Noted   Aortic atherosclerosis (HCC) 09/24/2023   Permanent atrial fibrillation (HCC) 09/12/2023   Obesity, morbid (HCC) 09/12/2023   Peripheral neuropathy  09/12/2023   Cancer of overlapping sites of bladder (HCC) 01/11/2021   Allergic rhinitis 12/30/2015   HLD (hyperlipidemia) 12/30/2015    PCP: Donzella Lauraine SAILOR, DO  REFERRING PROVIDER: Donzella Lauraine SAILOR, DO  REFERRING DIAG: M54.2 (ICD-10-CM) - Neck pain M54.50,G89.29 (ICD-10-CM) - Chronic low back pain, unspecified back pain laterality, unspecified whether sciatica present  Rationale for Evaluation and Treatment:  Rehabilitation  THERAPY DIAG:  Other low back pain  Cervicalgia  ONSET DATE: Neck: 2-3 years ago; back: 10 years ago  SUBJECTIVE:                                                                                                                                                                                           SUBJECTIVE STATEMENT: Neck seems to be alright, better able to bring it back further, able to sleep with 1 pillow behind his head on his back now instead of 2 pillows. No back pain currently. Feels like he can graduate to his HEP today.   Brother in law passed away this morning. Has to drive to [place]  in the near future.       PERTINENT HISTORY:  Low back and neck pain. Just got back from interring his wife's ashes. Emotional currently. Pt was in for a medicare physical recently. Unable to put his head back when he lays in supine. Was then referred to PT. Trying to lift his neck up to drink a can of soda bothers his back because he can't get his neck back far enough. Feels like his back pain is also due to his belly. Back pain has been going on for about 10 years, gradual onset. Has B plantar paresthesia, feels like socks are bunched up on his foot. Has R and L anterior lateral thigh paresthesia (lateral femoral cutaneous nerve) when pt sits for a while.   Neck pain located lower cervical spine and posterior upper cervical spine. Neck pain began about 2-3 years ago, gradual onset. Neck bothers him more currently. More of a concern because he feels like his neck pain is getting worse.   No blood pressure problems Has atrial fibrillation, treated with metroprolol  No latex allergy      PAIN:  Are you having pain? Yes: NPRS scale: 0/10 Pain location: lower and upper cervical spine Pain description: pressure, discomfort Aggravating factors: looking up, R cervical rotation Relieving factors: resting position, cervical protraction.   PRECAUTIONS: no known precautions    RED FLAGS: Bowel or bladder incontinence: No and Cauda equina syndrome: No   WEIGHT BEARING RESTRICTIONS: No  FALLS:  Has patient fallen in last 6 months? 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   NEXT MD VISIT: Early August 2025  OBJECTIVE:  Note: Objective measures were completed at Evaluation unless otherwise noted.  DIAGNOSTIC FINDINGS:    PATIENT SURVEYS:  NDI:  NECK DISABILITY INDEX  Date: 12/04/23 Score  Pain intensity 0 = I have no pain at the moment  2. Personal care (washing, dressing, etc.) 0 = I can look after myself normally without causing extra pain  3. Lifting 0 =  I can lift heavy weights without extra pain  4. Reading 0 = I can read as much as I want to with no pain in my neck  5. Headaches 1 =  I have slight headaches, which come infrequently  6. Concentration 2 = I have a fair degree of difficulty in concentrating when I want to (recently widowed per pt)  7. Work 0 =  I can do as much work as I want to  8. Driving 0 = I can drive my car without any neck pain  9. Sleeping 3 =  My sleep is moderately disturbed (2-3 hrs sleepless)  10. Recreation 0 = I am able to engage in all my recreation activities with no neck pain at all  Total 6/50 (12%)   Minimum Detectable Change (90% confidence): 5 points or 10% points  NECK DISABILITY INDEX  Date: 01/15/2024 Score  Pain intensity 0 = I have no pain at the moment  2. Personal care (washing, dressing, etc.) 0 = I can look after myself normally without causing extra pain  3. Lifting 0 =  I can lift heavy weights without extra pain  4. Reading 0 = I can read as much as I want to with no pain in my neck  5. Headaches 1 =  I have  slight headaches, which come infrequently  6. Concentration 0 =  I can concentrate fully when I want to with no difficulty  7. Work 0 =  I can do as much work as I want to  8. Driving 0 = I can drive my car without any neck pain  9. Sleeping 1 = My sleep is slightly disturbed (less than 1 hr sleepless)  10. Recreation 0 = I am able to engage in all my recreation activities with no neck pain at all  Total 2/50   Minimum Detectable Change (90% confidence): 5 points or 10% points    COGNITION: Overall cognitive status: Within functional limits for tasks assessed     SENSATION:   MUSCLE LENGTH:   POSTURE:  Forward neck, thoracic kyphosis, B protracted shoulders, movement preference around C5/6 and C2/3 area. Backward lean, B genu varus R > L, L low back rotation, R lumbar convexity, R lateral shift   PALPATION: Cervical paraspinal and B upper trap muscle tension   CERVICAL ROM:   Active ROM A/PROM (deg) eval  Flexion WFL  Extension Limited with reproduction of pain  Right lateral flexion Limited with L lateral neck stretch  Left lateral flexion WFL with R lateral neck stretch    Right rotation 55 degrees (with neck pain with overpressure)  Left rotation 53 degrees (with neck and R upper trap area pain)   (Blank rows = not tested)  UPPER EXTREMITY MMT:  MMT Right eval Left eval  Shoulder flexion 4 4  Shoulder extension    Shoulder abduction    Shoulder adduction    Shoulder extension    Shoulder internal rotation    Shoulder external rotation    Middle trapezius    Lower trapezius (seated manually resisted) 4 4  Elbow flexion 4 4  Elbow extension 5 4  Wrist flexion    Wrist extension 4 4  Wrist ulnar deviation    Wrist radial deviation    Wrist pronation    Wrist supination    Grip strength     (Blank rows = not tested)   LUMBAR ROM:   AROM eval  Flexion   Extension   Right lateral flexion   Left lateral flexion   Right rotation   Left rotation     (Blank rows = not tested)  LOWER EXTREMITY ROM:     Active  Right eval Left eval  Hip flexion    Hip extension    Hip abduction    Hip adduction    Hip internal rotation    Hip external rotation    Knee flexion    Knee extension    Ankle dorsiflexion    Ankle plantarflexion    Ankle inversion    Ankle eversion     (Blank rows = not tested)  LOWER EXTREMITY MMT:    MMT Right eval Left eval  Hip flexion    Hip extension    Hip abduction    Hip adduction    Hip internal rotation    Hip external rotation    Knee flexion    Knee extension    Ankle dorsiflexion    Ankle plantarflexion    Ankle inversion    Ankle eversion     (Blank rows = not tested)  LUMBAR SPECIAL TESTS:    FUNCTIONAL TESTS:    GAIT: Distance walked:  Assistive device utilized:  Level of assistance:  Comments:   TREATMENT DATE: 01/16/2024                                                                                                                               Neuromuscular re education Performed to improve posture and decrease stress to neck and back.     Sitting with lumbar towel roll.   Feels nice for his spine.    Then with chin tuck to promote upper thoracic extension and decrease stress to neck and low back 10x5 seconds for 2 sets   Hook lying     Bridge, pain free ROM, 10x3 holding 3 KG med ball  Seated manually resisted scapular retraction targeting the lower trap   R 10x5 seconds  L 10x5 seconds   Reviewed progress/current status with PT towards goals.    Cues needed to keep pt in task    Improved exercise technique, movement at target joints, use of target muscles after mod verbal, visual, tactile cues.  PATIENT EDUCATION:  Education details: there-ex, HEP Person educated: Patient Education method: Explanation, Demonstration, Tactile cues, Verbal cues, and Handouts Education comprehension: verbalized understanding and returned demonstration  HOME EXERCISE  PROGRAM: Access Code: [email or web address] URL: [email or web address] Date: 12/06/2023 Prepared by: Emil Glassman  Exercises - Seated Cervical Retraction  - 3 x daily - 7 x weekly - 3 sets - 10 reps - 5 seconds  hold - Seated Scapular Retraction  - 3 x daily - 7 x weekly - 3 sets - 10 reps - 5 seconds hold - Seated Shoulder Horizontal Abduction - Thumbs Up  - 3 x daily - 7 x weekly - 3 sets - 10 reps - 5 seconds hold - Shoulder extension with resistance - Neutral  - 1 x daily - 7 x weekly - 3 sets - 10 reps - 5 seconds hold  Green band  - Standing Gluteal Sets  - 3 x daily - 7 x weekly - 3 sets - 10 reps - 5 seconds hold  - Seated Transversus Abdominis Bracing  - 3 x daily - 7 x weekly - 3 sets - 10 reps - 5 seconds hold   Hooklying   Hip extension isometrics, leg straight    R 10x5 seconds for 3 sets   L 10x5 seconds for 3 sets  - Supine Shoulder Flexion Extension Full Range AROM  - 1 x daily - 7 x weekly - 3 sets - 10 reps - 5 seconds hold - Standing Hip Abduction with Counter Support  - 1 x daily - 7 x weekly - 3 sets - 10 reps   Sitting with lumbar towel roll.    ASSESSMENT:  CLINICAL IMPRESSION: Pt demonstrates overall decreased neck and back pain, improved scapular strength and improved cervical function since initial evaluation. Pt has made good progress with PT towards goals and states that he is ready to graduate to his HEP. Skilled physical therapy services discharged with pt continuing his progress with his exercises at home.       OBJECTIVE IMPAIRMENTS: decreased ROM, decreased strength, improper body mechanics, postural dysfunction, and pain.   ACTIVITY LIMITATIONS: lifting, standing, reach over head, and locomotion level  PARTICIPATION LIMITATIONS:   PERSONAL FACTORS: Age, Fitness, Time since onset of injury/illness/exacerbation, and 3+ comorbidities: Aortic atherosclerosis, atrial fibrillation, depression,  are also affecting patient's functional outcome.    REHAB POTENTIAL: Fair    CLINICAL DECISION MAKING: Evolving/moderate complexity, neck pain is worse since onset per pt  EVALUATION COMPLEXITY: Moderate   GOALS: Goals reviewed with patient? Yes  SHORT TERM GOALS: Target date: 12/13/2023  Pt will be independent with his initial HEP to improve posture, strength, decrease pain, improved function.  Baseline: Pt has not yet started his initial HEP (12/04/2023); No questions, exercises help (01/16/2024) Goal status: MET   LONG TERM GOALS: Target date: 01/31/2024  Pt will have a decrease in neck pain to 1/10 or less at worst to promote ability to look up and around more comfortably  Baseline: 3/10 neck pain at most for the past 3 months (12/04/2023); 2-3/10 at most for the past 7 days (01/16/2024) Goal status: PROGRESSING  2.  Pt will have a decrease in low back pain to 1/10 or less at worst to promote ability to perform standing tasks more comfortably.  Baseline: 3/10 at most for the past 3 months (12/04/2023); 4/10 at most for the past 7 days, no debilitating stabs of pain; before starting PT, back pain was a 7/10 at most, pt states PT  helps (01/16/2024) Goal status: PROGRESSING   3.  Pt will improve his Neck Disability Index (NDI) to 4% or less as a demonstration of improved function.  Baseline: Neck Disability Index (NDI) 6/50 (12%) (12/04/2023); 2/50 (4%) (01/15/2024) Goal status: MET  4.  Pt will improve his B lower trap strength by at least 1/2 MMT grade to promote ability to look around with less neck pain.  Baseline:  MMT Right eval Left eval R (01/16/2024) L (01/16/2024)  Lower trapezius (seated manually resisted) 4 4 4+ 4+   Goal status: MET   PLAN:  PT FREQUENCY: 1-2x/week  PT DURATION: 8 weeks  PLANNED INTERVENTIONS: 97110-Therapeutic exercises, 97530- Therapeutic activity, 97112- Neuromuscular re-education, 97535- Self Care, 02859- Manual therapy, [phone]- Gait training, [phone]- Aquatic Therapy, [phone]- Electrical stimulation  (unattended), [phone]- Traction (mechanical), [id number]- Ionotophoresis 4mg /ml Dexamethasone , Patient/Family education, Joint mobilization, and Spinal mobilization.  PLAN FOR NEXT SESSION: Posture, thoracic extension, scapular and trunk strengthening, manual techniques, modalities PRN  Thank you for your referral.  Emil Glassman, PT, DPT Physical Therapist- [facility]  [facility] 01/16/2024, 4:30 PM

## 2024-01-17 ENCOUNTER — Ambulatory Visit

## 2024-01-21 LAB — CBC
Hematocrit: 53.7 % — ABNORMAL HIGH (ref 37.5–51.0)
Hemoglobin: 17.2 g/dL (ref 13.0–17.7)
MCH: 29.3 pg (ref 26.6–33.0)
MCHC: 32 g/dL (ref 31.5–35.7)
MCV: 91 fL (ref 79–97)
Platelets: 244 x10E3/uL (ref 150–450)
RBC: 5.88 x10E6/uL — ABNORMAL HIGH (ref 4.14–5.80)
RDW: 12.2 % (ref 11.6–15.4)
WBC: 8.5 x10E3/uL (ref 3.4–10.8)

## 2024-01-21 LAB — TESTOSTERONE, FREE, TOTAL, SHBG
Sex Hormone Binding: 44.8 nmol/L (ref 19.3–76.4)
Testosterone, Free: 3.5 pg/mL — ABNORMAL LOW (ref 6.6–18.1)
Testosterone: 329 ng/dL (ref 264–916)

## 2024-01-21 LAB — FSH/LH
FSH: 5.3 m[IU]/mL (ref 1.5–12.4)
LH: 4.4 m[IU]/mL (ref 1.7–8.6)

## 2024-01-21 LAB — ESTRADIOL, FREE
Estradiol, Serum, MS: 21 pg/mL
Free Estradiol, Percent: 1.6 %
Free Estradiol, Serum: 0.34 pg/mL

## 2024-01-21 LAB — HEMOGLOBIN A1C
Est. average glucose Bld gHb Est-mCnc: 151 mg/dL
Hgb A1c MFr Bld: 6.9 % — ABNORMAL HIGH (ref 4.8–5.6)

## 2024-01-21 LAB — PROLACTIN: Prolactin: 7.6 ng/mL (ref 3.6–25.2)

## 2024-01-24 DIAGNOSIS — H2513 Age-related nuclear cataract, bilateral: Secondary | ICD-10-CM | POA: Diagnosis not present

## 2024-01-24 DIAGNOSIS — H43811 Vitreous degeneration, right eye: Secondary | ICD-10-CM | POA: Diagnosis not present

## 2024-01-24 DIAGNOSIS — H43392 Other vitreous opacities, left eye: Secondary | ICD-10-CM | POA: Diagnosis not present

## 2024-01-25 ENCOUNTER — Other Ambulatory Visit: Payer: Self-pay | Admitting: Urology

## 2024-01-27 ENCOUNTER — Ambulatory Visit: Payer: Self-pay | Admitting: Family Medicine

## 2024-01-27 NOTE — Addendum Note (Signed)
 Addended by: REMUS NOEMI PARAS on: 01/27/2024 09:33 AM   Modules accepted: Orders

## 2024-02-18 ENCOUNTER — Other Ambulatory Visit: Payer: Self-pay | Admitting: Urology

## 2024-03-02 ENCOUNTER — Other Ambulatory Visit: Payer: Self-pay

## 2024-03-02 MED ORDER — NA SULFATE-K SULFATE-MG SULF 17.5-3.13-1.6 GM/177ML PO SOLN: 1.0000 | Freq: Once | ORAL | 0 refills | Status: AC

## 2024-03-05 ENCOUNTER — Ambulatory Visit: Admitting: Anesthesiology

## 2024-03-05 ENCOUNTER — Encounter: Payer: Self-pay | Admitting: Gastroenterology

## 2024-03-05 ENCOUNTER — Encounter
Admission: RE | Disposition: A | Discharge status: Home / Self Care | Payer: Self-pay | Source: Home / Self Care | Attending: Gastroenterology

## 2024-03-05 ENCOUNTER — Ambulatory Visit
Admission: RE | Admit: 2024-03-05 | Discharge: 2024-03-05 | Disposition: A | Discharge status: Home / Self Care | Attending: Gastroenterology | Admitting: Gastroenterology

## 2024-03-05 DIAGNOSIS — I4891 Unspecified atrial fibrillation: Secondary | ICD-10-CM | POA: Insufficient documentation

## 2024-03-05 DIAGNOSIS — Z1211 Encounter for screening for malignant neoplasm of colon: Secondary | ICD-10-CM | POA: Diagnosis not present

## 2024-03-05 DIAGNOSIS — D123 Benign neoplasm of transverse colon: Secondary | ICD-10-CM | POA: Insufficient documentation

## 2024-03-05 DIAGNOSIS — K640 First degree hemorrhoids: Secondary | ICD-10-CM | POA: Insufficient documentation

## 2024-03-05 DIAGNOSIS — K635 Polyp of colon: Secondary | ICD-10-CM

## 2024-03-05 DIAGNOSIS — Z87891 Personal history of nicotine dependence: Secondary | ICD-10-CM | POA: Diagnosis not present

## 2024-03-05 DIAGNOSIS — I2510 Atherosclerotic heart disease of native coronary artery without angina pectoris: Secondary | ICD-10-CM | POA: Diagnosis not present

## 2024-03-05 DIAGNOSIS — K5730 Diverticulosis of large intestine without perforation or abscess without bleeding: Secondary | ICD-10-CM | POA: Insufficient documentation

## 2024-03-05 HISTORY — PX: POLYPECTOMY: SHX149

## 2024-03-05 HISTORY — PX: COLONOSCOPY: SHX5424

## 2024-03-05 SURGERY — COLONOSCOPY
Anesthesia: General

## 2024-03-05 MED ORDER — LIDOCAINE HCL (CARDIAC) PF 100 MG/5ML IV SOSY
PREFILLED_SYRINGE | INTRAVENOUS | Status: DC | PRN
  Administered 2024-03-05: 80 mg via INTRAVENOUS

## 2024-03-05 MED ORDER — PROPOFOL 500 MG/50ML IV EMUL
INTRAVENOUS | Status: DC | PRN
  Administered 2024-03-05: 50 ug/kg/min via INTRAVENOUS

## 2024-03-05 MED ORDER — LIDOCAINE HCL (PF) 2 % IJ SOLN
INTRAMUSCULAR | Status: AC
  Filled 2024-03-05: qty 5

## 2024-03-05 MED ORDER — STERILE WATER FOR INJECTION IJ SOLN
INTRAMUSCULAR | Status: AC
  Filled 2024-03-05: qty 40

## 2024-03-05 MED ORDER — PROPOFOL 10 MG/ML IV BOLUS
INTRAVENOUS | Status: DC | PRN
  Administered 2024-03-05 (×2): 50 mg via INTRAVENOUS

## 2024-03-05 MED ORDER — DEXMEDETOMIDINE HCL IN NACL 80 MCG/20ML IV SOLN
INTRAVENOUS | Status: DC | PRN
  Administered 2024-03-05: 12 ug via INTRAVENOUS
  Administered 2024-03-05: 8 ug via INTRAVENOUS

## 2024-03-05 MED ORDER — SODIUM CHLORIDE 0.9 % IV SOLN: INTRAVENOUS | Status: DC

## 2024-03-05 NOTE — Op Note (Signed)
 [facility] Gastroenterology Patient Name: Francis Jones Procedure Date: 03/05/2024 8:39 AM MRN: [id number] Account #: [id number] Date of Birth: Jun 12, 1950 Admit Type: Inpatient Age: 73 Room: [facility] ENDO ROOM 3 Gender: Male Note Status: Finalized Instrument Name: Colon Scope [id number] Procedure:             Colonoscopy Indications:           Screening for colorectal malignant neoplasm Providers:             Rogelia Copping MD, MD Medicines:             Propofol  per Anesthesia Complications:         No immediate complications. Procedure:             Pre-Anesthesia Assessment:                        - Prior to the procedure, a History and Physical was                         performed, and patient medications and allergies were                         reviewed. The patient's tolerance of previous                         anesthesia was also reviewed. The risks and benefits                         of the procedure and the sedation options and risks                         were discussed with the patient. All questions were                         answered, and informed consent was obtained. Prior                         Anticoagulants: The patient has taken no anticoagulant                         or antiplatelet agents. ASA Grade Assessment: II - A                         patient with mild systemic disease. After reviewing                         the risks and benefits, the patient was deemed in                         satisfactory condition to undergo the procedure.                        After obtaining informed consent, the colonoscope was                         passed under direct vision. Throughout the procedure,                         the patient's  blood pressure, pulse, and oxygen                         saturations were monitored continuously. The                         Colonoscope was introduced through the anus and                         advanced to the the  cecum, identified by appendiceal                         orifice and ileocecal valve. The colonoscopy was                         performed without difficulty. The patient tolerated                         the procedure well. The quality of the bowel                         preparation was good. Findings:      The perianal and digital rectal examinations were normal.      A 3 mm polyp was found in the transverse colon. The polyp was sessile.       The polyp was removed with a cold snare. Resection and retrieval were       complete.      Multiple small-mouthed diverticula were found in the entire colon.      Non-bleeding internal hemorrhoids were found during retroflexion. The       hemorrhoids were Grade I (internal hemorrhoids that do not prolapse). Impression:            - One 3 mm polyp in the transverse colon, removed with                         a cold snare. Resected and retrieved.                        - Diverticulosis in the entire examined colon.                        - Non-bleeding internal hemorrhoids. Recommendation:        - Discharge patient to home.                        - Resume previous diet.                        - Continue present medications.                        - Await pathology results.                        - Repeat colonoscopy is not recommended for                         surveillance. Procedure Code(s):     --- Professional ---  54614, Colonoscopy, flexible; with removal of                         tumor(s), polyp(s), or other lesion(s) by snare                         technique Diagnosis Code(s):     --- Professional ---                        Z12.11, Encounter for screening for malignant neoplasm                         of colon                        D12.3, Benign neoplasm of transverse colon (hepatic                         flexure or splenic flexure) CPT copyright 2022 American Medical Association. All rights reserved. The  codes documented in this report are preliminary and upon coder review may  be revised to meet current compliance requirements. Rogelia Copping MD, MD 03/05/2024 9:13:50 AM This report has been signed electronically. Number of Addenda: 0 Note Initiated On: 03/05/2024 8:39 AM Scope Withdrawal Time: 0 hours 8 minutes 25 seconds  Total Procedure Duration: 0 hours 16 minutes 52 seconds  Estimated Blood Loss:  Estimated blood loss: none.      [facility]

## 2024-03-05 NOTE — Anesthesia Postprocedure Evaluation (Signed)
 Anesthesia Post Note  Patient: Francis Jones  Procedure(s) Performed: COLONOSCOPY POLYPECTOMY, INTESTINE  Patient location during evaluation: Endoscopy Anesthesia Type: General Level of consciousness: awake and alert Pain management: pain level controlled Vital Signs Assessment: post-procedure vital signs reviewed and stable Respiratory status: spontaneous breathing, nonlabored ventilation and respiratory function stable Cardiovascular status: blood pressure returned to baseline and stable Postop Assessment: no apparent nausea or vomiting Anesthetic complications: no   No notable events documented.   Last Vitals:  Vitals:   03/05/24 0925 03/05/24 0938  BP: 118/82 112/75  Pulse: 91 78  Resp: 19 14  Temp:    SpO2: 96% 95%    Last Pain:  Vitals:   03/05/24 0938  TempSrc:   PainSc: 0-No pain                 Fairy POUR Zaylynn Rickett

## 2024-03-05 NOTE — Transfer of Care (Signed)
 Immediate Anesthesia Transfer of Care Note  Patient: Francis Jones  Procedure(s) Performed: COLONOSCOPY POLYPECTOMY, INTESTINE  Patient Location: PACU  Anesthesia Type:General  Level of Consciousness: sedated  Airway & Oxygen Therapy: Patient Spontanous Breathing  Post-op Assessment: Report given to RN and Post -op Vital signs reviewed and stable  Post vital signs: Reviewed and stable  Last Vitals:  Vitals Value Taken Time  BP    Temp    Pulse 133 03/05/24 09:15  Resp 11 03/05/24 09:15  SpO2 95 % 03/05/24 09:15  Vitals shown include unfiled device data.  Last Pain:  Vitals:   03/05/24 0813  TempSrc: Temporal  PainSc: 0-No pain         Complications: No notable events documented.

## 2024-03-05 NOTE — Anesthesia Preprocedure Evaluation (Signed)
 Anesthesia Evaluation  Patient identified by MRN, date of birth, ID band Patient awake    Reviewed: Allergy & Precautions, NPO status , Patient's Chart, lab work & pertinent test results  History of Anesthesia Complications (+) PONV and history of anesthetic complications  Airway Mallampati: III  TM Distance: <3 FB Neck ROM: full    Dental  (+) Chipped   Pulmonary neg pulmonary ROS, neg shortness of breath, former smoker   Pulmonary exam normal        Cardiovascular (-) angina + CAD  + dysrhythmias Atrial Fibrillation      Neuro/Psych  Neuromuscular disease    GI/Hepatic negative GI ROS, Neg liver ROS,neg GERD  ,,  Endo/Other  negative endocrine ROS    Renal/GU negative Renal ROS  negative genitourinary   Musculoskeletal   Abdominal   Peds  Hematology negative hematology ROS (+)   Anesthesia Other Findings Past Medical History: No date: Allergic rhinitis 08-29-23: Anxiety     Comment:  Death of spouse No date: Aortic atherosclerosis No date: Arthritis 12/16/2020: Atrial fibrillation (HCC)     Comment:  a.) CHA2DS2VASc = 2 (age, vascular disease history);                b.) rate/rhythm maintained on oral metoprolol  succinate;               has refused DCCV and chronic oral anticoagulation No date: Cataract No date: Coronary artery calcification seen on CT scan No date: Depression No date: Diverticulosis No date: Dyspnea No date: HLD (hyperlipidemia) No date: PONV (postoperative nausea and vomiting) 02/04/2018: Urothelial carcinoma of bladder (HCC)     Comment:  a.) s/p TURBT 02/04/2018: pathology (+) high grade               non-invasive urothelial carcinoma (no muscularis propria               invasion); b.) s/p TURBT 10/13/2019 --> pathology (+):               low grade non-invasive urothelial carcinoma (no               muscularis propria invasion); c.) s/p TURBT 12/27/2020:               pathology  (+) low grade non-invasive urothelial carcinoma              (no muscularis propria invasion)  Past Surgical History: 1973: APPENDECTOMY 12/04/2022: BLADDER INSTILLATION; N/A     Comment:  Procedure: BLADDER INSTILLATION OF GEMCITABINE ;                Surgeon: Twylla Glendia BROCKS, MD;  Location: [facility] ORS;                Service: Urology;  Laterality: N/A; 10/21/2023: COLONOSCOPY; N/A     Comment:  Procedure: COLONOSCOPY;  Surgeon: Therisa Bi, MD;                Location: [facility] ENDOSCOPY;  Service: Gastroenterology;                Laterality: N/A; 12/27/2020: CYSTOSCOPY WITH BIOPSY; N/A     Comment:  Procedure: CYSTOSCOPY WITH BLADDER BIOPSY;  Surgeon:               Twylla Glendia BROCKS, MD;  Location: [facility] OR[facility];  Service:               Urology;  Laterality: N/A; 12/27/2020: CYSTOSCOPY WITH FULGERATION; N/A  Comment:  Procedure: CYSTOSCOPY WITH FULGERATION;  Surgeon:               Twylla Glendia BROCKS, MD;  Location: [facility]S;  Service:               Urology;  Laterality: N/A; 02/04/2018: CYSTOSCOPY WITH STENT PLACEMENT; Right     Comment:  Procedure: CYSTOSCOPY;  Surgeon: Twylla Glendia BROCKS, MD;                Location: [facility]S;  Service: Urology;  Laterality:               Right; 2001: FINGER SURGERY; Left     Comment:  titanium in left 5th finger No date: FRACTURE SURGERY 2000: HERNIA REPAIR 2009: SHOULDER SURGERY; Left 02/04/2018: TRANSURETHRAL RESECTION OF BLADDER TUMOR; N/A     Comment:  Procedure: TRANSURETHRAL RESECTION OF BLADDER TUMOR               (TURBT) with gemcitabine ;  Surgeon: Twylla Glendia BROCKS, MD;              Location: [facility] ORS;  Service: Urology;  Laterality: N/A; 10/13/2019: TRANSURETHRAL RESECTION OF BLADDER TUMOR; N/A     Comment:  Procedure: TRANSURETHRAL RESECTION OF BLADDER TUMOR               (TURBT) with gemcitabine ;  Surgeon: Twylla Glendia BROCKS, MD;              Location: [facility]S;  Service: Urology;  Laterality: N/A; 12/04/2022: TRANSURETHRAL RESECTION OF BLADDER  TUMOR; N/A     Comment:  Procedure: TRANSURETHRAL RESECTION OF BLADDER TUMOR               (TURBT);  Surgeon: Twylla Glendia BROCKS, MD;  Location: [facility]               ORS;  Service: Urology;  Laterality: N/A; 1987: VASECTOMY  BMI    Body Mass Index: 36.70 kg/m      Reproductive/Obstetrics negative OB ROS                              Anesthesia Physical Anesthesia Plan  ASA: 3  Anesthesia Plan: General   Post-op Pain Management:    Induction: Intravenous  PONV Risk Score and Plan: Propofol  infusion and TIVA  Airway Management Planned: Natural Airway and Nasal Cannula  Additional Equipment:   Intra-op Plan:   Post-operative Plan:   Informed Consent: I have reviewed the patients History and Physical, chart, labs and discussed the procedure including the risks, benefits and alternatives for the proposed anesthesia with the patient or authorized representative who has indicated his/her understanding and acceptance.     Dental Advisory Given  Plan Discussed with: Anesthesiologist, CRNA and Surgeon  Anesthesia Plan Comments: (Patient consented for risks of anesthesia including but not limited to:  - adverse reactions to medications - risk of airway placement if required - damage to eyes, teeth, lips or other oral mucosa - nerve damage due to positioning  - sore throat or hoarseness - Damage to heart, brain, nerves, lungs, other parts of body or loss of life  Patient voiced understanding and assent.)        Anesthesia Quick Evaluation

## 2024-03-05 NOTE — H&P (Signed)
 Rogelia Copping, MD [facility] [address] [place], [place] [address] Phone: [phone] Fax : [phone]  Primary Care Physician:  Donzella Lauraine SAILOR, DO Primary Gastroenterologist:  Dr. Copping  Pre-Procedure History & Physical: HPI:  Francis Jones is a 73 y.o. male is here for a screening colonoscopy.   Past Medical History:  Diagnosis Date   Allergic rhinitis    Anxiety 08/20/23   Death of spouse   Aortic atherosclerosis    Arthritis    Atrial fibrillation (HCC) 12/16/2020   a.) CHA2DS2VASc = 2 (age, vascular disease history);  b.) rate/rhythm maintained on oral metoprolol  succinate; has refused DCCV and chronic oral anticoagulation   Cataract    Coronary artery calcification seen on CT scan    Depression    Diverticulosis    Dyspnea    HLD (hyperlipidemia)    PONV (postoperative nausea and vomiting)    Urothelial carcinoma of bladder (HCC) 02/04/2018   a.) s/p TURBT 02/04/2018: pathology (+) high grade non-invasive urothelial carcinoma (no muscularis propria invasion); b.) s/p TURBT 10/13/2019 --> pathology (+): low grade non-invasive urothelial carcinoma (no muscularis propria invasion); c.) s/p TURBT 12/27/2020: pathology (+) low grade non-invasive urothelial carcinoma (no muscularis propria invasion)    Past Surgical History:  Procedure Laterality Date   APPENDECTOMY  1973   BLADDER INSTILLATION N/A 12/04/2022   Procedure: BLADDER INSTILLATION OF GEMCITABINE ;  Surgeon: Twylla Glendia BROCKS, MD;  Location: [facility];  Service: Urology;  Laterality: N/A;   COLONOSCOPY N/A 10/21/2023   Procedure: COLONOSCOPY;  Surgeon: Therisa Bi, MD;  Location: [facility] ENDOSCOPY;  Service: Gastroenterology;  Laterality: N/A;   CYSTOSCOPY WITH BIOPSY N/A 12/27/2020   Procedure: CYSTOSCOPY WITH BLADDER BIOPSY;  Surgeon: Twylla Glendia BROCKS, MD;  Location: [facility];  Service: Urology;  Laterality: N/A;   CYSTOSCOPY WITH FULGERATION N/A 12/27/2020   Procedure: CYSTOSCOPY WITH FULGERATION;   Surgeon: Twylla Glendia BROCKS, MD;  Location: [facility];  Service: Urology;  Laterality: N/A;   CYSTOSCOPY WITH STENT PLACEMENT Right 02/04/2018   Procedure: CYSTOSCOPY;  Surgeon: Twylla Glendia BROCKS, MD;  Location: [facility];  Service: Urology;  Laterality: Right;   FINGER SURGERY Left 2001   titanium in left 5th finger   FRACTURE SURGERY     HERNIA REPAIR  2000   SHOULDER SURGERY Left 2009   TRANSURETHRAL RESECTION OF BLADDER TUMOR N/A 02/04/2018   Procedure: TRANSURETHRAL RESECTION OF BLADDER TUMOR (TURBT) with gemcitabine ;  Surgeon: Twylla Glendia BROCKS, MD;  Location: [facility];  Service: Urology;  Laterality: N/A;   TRANSURETHRAL RESECTION OF BLADDER TUMOR N/A 10/13/2019   Procedure: TRANSURETHRAL RESECTION OF BLADDER TUMOR (TURBT) with gemcitabine ;  Surgeon: Twylla Glendia BROCKS, MD;  Location: [facility];  Service: Urology;  Laterality: N/A;   TRANSURETHRAL RESECTION OF BLADDER TUMOR N/A 12/04/2022   Procedure: TRANSURETHRAL RESECTION OF BLADDER TUMOR (TURBT);  Surgeon: Twylla Glendia BROCKS, MD;  Location: [facility] OR[facility];  Service: Urology;  Laterality: N/A;   VASECTOMY  1987    Prior to Admission medications   Medication Sig Start Date End Date Taking? Authorizing Provider  aspirin  EC 81 MG tablet Take 81 mg by mouth daily. Swallow whole.   Yes [provider]  metoprolol  succinate (TOPROL  XL) 25 MG 24 hr tablet Take 1 tablet (25 mg total) by mouth daily. 07/22/23  Yes Agbor-Etang, Redell, MD  rosuvastatin  (CRESTOR ) 5 MG tablet Take 1 tablet (5 mg total) by mouth daily. 01/13/24  Yes Pardue, Lauraine SAILOR, DO  tadalafil  (CIALIS ) 20 MG tablet 1  TAB 1 HOUR PRIOR TO INTERCOURSE 02/19/24   Twylla Glendia BROCKS, MD    Allergies as of 10/25/2023 - Review Complete 10/21/2023  Allergen Reaction Noted   Hydrocodone-acetaminophen  Nausea And Vomiting 12/30/2015   Oxycodone  Nausea And Vomiting 10/06/2013    Family History  Problem Relation Age of Onset   Alzheimer's disease Father    Arthritis Mother     Social  History   Socioeconomic History   Marital status: Widowed    Spouse name: Francis Jones   Number of children: Not on file   Years of education: Not on file   Highest education level: Doctorate  Occupational History   Occupation: retired Teacher, English as a foreign language  Tobacco Use   Smoking status: Former    Current packs/day: 0.00    Types: Cigarettes    Quit date: 1979    Years since quitting: 46.7   Smokeless tobacco: Never  Vaping Use   Vaping status: Never Used  Substance and Sexual Activity   Alcohol use: Yes    Alcohol/week: 5.0 standard drinks of alcohol    Types: 3 Glasses of wine, 1 Cans of beer, 1 Shots of liquor per week    Comment: occasional   Drug use: Not Currently    Types: LSD, Opium    Comment: as a teenager   Sexual activity: Yes    Birth control/protection: None  Other Topics Concern   Not on file  Social History Narrative   Pt lives with wife; retd- Stage manager- Nurse, children's. 5 years smoker- remote.    Social Drivers of Corporate investment banker Strain: Low Risk  (11/24/2023)   Overall Financial Resource Strain (CARDIA)    Difficulty of Paying Living Expenses: Not hard at all  Food Insecurity: No Food Insecurity (11/24/2023)   Hunger Vital Sign    Worried About Running Out of Food in the Last Year: Never true    Ran Out of Food in the Last Year: Never true  Transportation Needs: No Transportation Needs (11/24/2023)   PRAPARE - Administrator, Civil Service (Medical): No    Lack of Transportation (Non-Medical): No  Physical Activity: Insufficiently Active (11/24/2023)   Exercise Vital Sign    Days of Exercise per Week: 2 days    Minutes of Exercise per Session: 20 min  Stress: Stress Concern Present (11/24/2023)   [employer] of Occupational Health - Occupational Stress Questionnaire    Feeling of Stress: Very much  Social Connections: Moderately Integrated (11/24/2023)   Social Connection and Isolation Panel    Frequency of Communication with  Friends and Family: More than three times a week    Frequency of Social Gatherings with Friends and Family: Once a week    Attends Religious Services: More than 4 times per year    Active Member of [employer] or Organizations: Yes    Attends Banker Meetings: More than 4 times per year    Marital Status: Widowed  Catering manager Violence: Not on file    Review of Systems: See HPI, otherwise negative ROS  Physical Exam: BP (!) 116/96   Pulse 90   Temp 97.6 F (36.4 C) (Temporal)   Resp 17   Ht 6' (1.829 m)   Wt 122.7 kg   BMI 36.70 kg/m  General:   Alert,  pleasant and cooperative in NAD Head:  Normocephalic and atraumatic. Neck:  Supple; no masses or thyromegaly. Lungs:  Clear throughout to auscultation.    Heart:  Regular rate and rhythm.  Abdomen:  Soft, nontender and nondistended. Normal bowel sounds, without guarding, and without rebound.   Neurologic:  Alert and  oriented x4;  grossly normal neurologically.  Impression/Plan: Francis Jones is now here to undergo a screening colonoscopy.  Risks, benefits, and alternatives regarding colonoscopy have been reviewed with the patient.  Questions have been answered.  All parties agreeable.

## 2024-03-06 LAB — SURGICAL PATHOLOGY

## 2024-03-08 ENCOUNTER — Ambulatory Visit: Payer: Self-pay | Admitting: Gastroenterology

## 2024-03-09 DIAGNOSIS — H2513 Age-related nuclear cataract, bilateral: Secondary | ICD-10-CM | POA: Diagnosis not present

## 2024-03-09 DIAGNOSIS — H43811 Vitreous degeneration, right eye: Secondary | ICD-10-CM | POA: Diagnosis not present

## 2024-03-21 ENCOUNTER — Encounter: Payer: Self-pay | Admitting: Family Medicine

## 2024-03-21 DIAGNOSIS — Z6836 Body mass index (BMI) 36.0-36.9, adult: Secondary | ICD-10-CM

## 2024-03-21 DIAGNOSIS — N529 Male erectile dysfunction, unspecified: Secondary | ICD-10-CM

## 2024-03-21 DIAGNOSIS — R5382 Chronic fatigue, unspecified: Secondary | ICD-10-CM

## 2024-03-21 DIAGNOSIS — G629 Polyneuropathy, unspecified: Secondary | ICD-10-CM

## 2024-04-14 ENCOUNTER — Encounter: Payer: Self-pay | Admitting: Family Medicine

## 2024-04-14 ENCOUNTER — Ambulatory Visit: Admitting: Family Medicine

## 2024-04-14 VITALS — BP 104/76 | HR 77 | Temp 97.7°F | Ht 72.0 in | Wt 274.2 lb

## 2024-04-14 DIAGNOSIS — K5900 Constipation, unspecified: Secondary | ICD-10-CM

## 2024-04-14 DIAGNOSIS — N529 Male erectile dysfunction, unspecified: Secondary | ICD-10-CM

## 2024-04-14 DIAGNOSIS — R5382 Chronic fatigue, unspecified: Secondary | ICD-10-CM

## 2024-04-14 DIAGNOSIS — Z6836 Body mass index (BMI) 36.0-36.9, adult: Secondary | ICD-10-CM | POA: Diagnosis not present

## 2024-04-14 DIAGNOSIS — R7303 Prediabetes: Secondary | ICD-10-CM | POA: Diagnosis not present

## 2024-04-14 DIAGNOSIS — G629 Polyneuropathy, unspecified: Secondary | ICD-10-CM | POA: Diagnosis not present

## 2024-04-14 NOTE — Assessment & Plan Note (Addendum)
 Morbid obesity requiring lifestyle modifications. Acknowledges need for increased physical activity and dietary changes. Current diet lacks greens. - Increase physical activity, aiming for 20-30 minutes of brisk walking daily. - Increase dietary intake of greens and vegetables. - Consider flavored water  or electrolyte drinks to increase fluid intake.
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
## 2024-04-14 NOTE — Patient Instructions (Addendum)
 Increase physical activity and water  intake to help improve constipation. - can consider sugar-free or reduced sugar electrolyte drinks in moderation to help retain water .  Recommend 2nd shingles vaccine before 05/30/2024

## 2024-04-15 ENCOUNTER — Other Ambulatory Visit: Payer: Self-pay

## 2024-04-15 LAB — PROLACTIN: Prolactin: 7.6 ng/mL (ref 3.6–25.2)

## 2024-04-15 LAB — TESTOSTERONE,FREE AND TOTAL
Testosterone, Free: 2.7 pg/mL — ABNORMAL LOW (ref 6.6–18.1)
Testosterone: 280 ng/dL (ref 264–916)

## 2024-04-15 LAB — CBC
Hematocrit: 51.6 % — ABNORMAL HIGH (ref 37.5–51.0)
Hemoglobin: 16.9 g/dL (ref 13.0–17.7)
MCH: 29.7 pg (ref 26.6–33.0)
MCHC: 32.8 g/dL (ref 31.5–35.7)
MCV: 91 fL (ref 79–97)
Platelets: 230 x10E3/uL (ref 150–450)
RBC: 5.69 x10E6/uL (ref 4.14–5.80)
RDW: 12.4 % (ref 11.6–15.4)
WBC: 8.2 x10E3/uL (ref 3.4–10.8)

## 2024-04-15 LAB — HEMOGLOBIN A1C
Est. average glucose Bld gHb Est-mCnc: 151 mg/dL
Hgb A1c MFr Bld: 6.9 % — ABNORMAL HIGH (ref 4.8–5.6)

## 2024-04-15 LAB — FSH/LH
FSH: 4.5 m[IU]/mL (ref 1.5–12.4)
LH: 2.5 m[IU]/mL (ref 1.7–8.6)

## 2024-04-15 LAB — VITAMIN B12: Vitamin B-12: 254 pg/mL (ref 232–1245)

## 2024-04-15 LAB — ESTRADIOL: Estradiol: 17.9 pg/mL (ref 7.6–42.6)

## 2024-04-15 LAB — VITAMIN D 25 HYDROXY (VIT D DEFICIENCY, FRACTURES): Vit D, 25-Hydroxy: 35.5 ng/mL (ref 30.0–100.0)

## 2024-04-15 LAB — SEX HORMONE BINDING GLOBULIN: Sex Hormone Binding: 39.9 nmol/L (ref 19.3–76.4)

## 2024-04-20 ENCOUNTER — Other Ambulatory Visit: Payer: Self-pay | Admitting: Urology

## 2024-04-22 ENCOUNTER — Ambulatory Visit: Payer: Self-pay | Admitting: Family Medicine

## 2024-04-24 ENCOUNTER — Encounter: Payer: Self-pay | Admitting: Urology

## 2024-04-25 ENCOUNTER — Other Ambulatory Visit: Payer: Self-pay | Admitting: Family Medicine

## 2024-04-28 NOTE — Telephone Encounter (Signed)
 Last read by Lynwood LITTIE Elson Signe at 8:13AM on 04/26/2024.

## 2024-05-01 ENCOUNTER — Telehealth: Payer: Self-pay | Admitting: Urology

## 2024-05-01 ENCOUNTER — Ambulatory Visit (INDEPENDENT_AMBULATORY_CARE_PROVIDER_SITE_OTHER): Admitting: Urology

## 2024-05-01 ENCOUNTER — Encounter: Payer: Self-pay | Admitting: Urology

## 2024-05-01 VITALS — BP 134/83 | HR 56 | Ht 72.0 in | Wt 274.0 lb

## 2024-05-01 DIAGNOSIS — Z8551 Personal history of malignant neoplasm of bladder: Secondary | ICD-10-CM

## 2024-05-01 NOTE — Telephone Encounter (Signed)
 Pt wants to talk about his test levels for low T at his upcoming appt on Wednesday.  It looks like all his prior Testosterone  lab tests were Testosterone  Free.  He wants to know if he should have any prior labs done before his appointment.

## 2024-05-01 NOTE — Progress Notes (Signed)
   05/01/24  Urologic history 1. Low grade Ta urethral carcinoma of the bladder TURBT August 2019 papillary bladder tumor High-grade urothelial carcinoma Ta Induction BCG completed November 2019 TURBT 10/13/2019 10 mm papillary lesion right bladder neck, received post resection gemcitabine  Pathology Ta urothelial carcinoma (low-grade) TURBT 12/2020 for 5 mm recurrent tumor posterior wall Pathology Ta low-grade urothelial carcinoma 6-week course of intravesical gemcitabine  in oncology TURBT for 1 cm recurrent bladder tumor 12/04/22;pathology Ta LG urothelial carcinoma Fulguration 10/30/2023 small papillary lesion anterior prostate near bladder neck   HPI: 73 y.o. male presents for follow-up surveillance cystoscopy.  No complaints.  Denies gross hematuria.    See rooming tab for vitals  Cystoscopy Procedure Note  Patient identification was confirmed, informed consent was obtained, and patient was prepped using Betadine solution.  Lidocaine  jelly was administered per urethral meatus.     Pre-Procedure: - Inspection reveals a normal caliber urethral meatus.  Procedure: The flexible cystoscope was introduced without difficulty - No urethral strictures/lesions are present. - Moderate lateral lobe enlargement prostate  - Median lobe present.   - Bilateral ureteral orifices identified - Bladder mucosa  reveals no erythema, solid or papillary lesions - No bladder stones - Moderate trabeculation  Retroflexion shows no abnormalities   Post-Procedure: - Patient tolerated the procedure well  Assessment/ Plan: No recurrent tumor noted Surveillance cystoscopy 6 months   Glendia JAYSON Barba, MD

## 2024-05-03 ENCOUNTER — Other Ambulatory Visit: Payer: Self-pay

## 2024-05-04 ENCOUNTER — Other Ambulatory Visit: Payer: Self-pay

## 2024-05-04 DIAGNOSIS — N5201 Erectile dysfunction due to arterial insufficiency: Secondary | ICD-10-CM

## 2024-05-05 ENCOUNTER — Other Ambulatory Visit: Payer: Self-pay

## 2024-05-05 ENCOUNTER — Other Ambulatory Visit

## 2024-05-05 DIAGNOSIS — N5201 Erectile dysfunction due to arterial insufficiency: Secondary | ICD-10-CM

## 2024-05-05 DIAGNOSIS — E291 Testicular hypofunction: Secondary | ICD-10-CM | POA: Diagnosis not present

## 2024-05-05 NOTE — Progress Notes (Unsigned)
 05/06/2024 1:13 PM   Francis Jones [birth year] [id number]  Referring provider: Donzella Lauraine SAILOR, DO [address] [place],  [place] [address]  Urological history: 1. 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 BPH with LU TS -cysto (11/2022) -mild lateral lobe enlargement and moderate trabeculation   No chief complaint on file.  HPI: Francis Jones is a 73 y.o. man who presents today for ED  Previous records reviewed.  SHIM ***  He does not have confidence that he could get and keep an erection, his erections are not firm enough for penetrative intercourse, he has difficulty maintaining his erections,  and he is not finding intercourse satisfactory for him.  ***  Patient still having spontaneous erections.  ***   He denies any pain or curvature with erections.    He is not able to ejaculate, has pain with ejaculation, and has blood in his ejaculate fluid.   ***  Testosterone  level (04/2024) 280, ***  Cholesterol (09/2023) elevated total cholesterol, elevated triglyceride, elevated LDL and elevated cholesterol/HDL ratio  Hemoglobin A1c (04/2024) 6.9  TSH (09/2023) 1.950  Prolactin, FSH/LH and estradiol  were normal.    Tried and failed ***  He has been experiencing reduced energy, reduced endurance, diminished work performance, diminished physical performance, loss of body hair, reduced beard growth, fatigue, reduced lean muscle mass  and obesity.  He has also noticed depressive symptoms, cognitive dysfunction, reduced motivation, poor concentration, poor memory and irritability.  There is also been a decreased in his libido and issues with erectile dysfunction.  Hemoglobin/hematocrit 16.9/51.6  PMH: Past Medical History:  Diagnosis Date   Allergic rhinitis    Anxiety 25-Aug-2023   Death of spouse   Aortic atherosclerosis    Arthritis    Atrial fibrillation (HCC) 12/16/2020   a.) CHA2DS2VASc = 2 (age, vascular disease history);  b.) rate/rhythm maintained on oral metoprolol  succinate; has refused DCCV and chronic oral anticoagulation   Cataract    Coronary artery calcification seen on CT scan    Depression    Diverticulosis    Dyspnea    HLD (hyperlipidemia)    PONV (postoperative nausea and vomiting)    Urothelial carcinoma of bladder (HCC) 02/04/2018   a.) s/p TURBT 02/04/2018: pathology (+) high grade non-invasive urothelial carcinoma (no muscularis propria invasion); b.) s/p TURBT 10/13/2019 --> pathology (+): low grade non-invasive urothelial carcinoma (no muscularis propria invasion); c.) s/p TURBT 12/27/2020: pathology (+) low grade non-invasive urothelial carcinoma (no muscularis propria invasion)    Surgical History: Past Surgical History:  Procedure Laterality Date   APPENDECTOMY  1973   BLADDER INSTILLATION N/A 12/04/2022   Procedure: BLADDER INSTILLATION OF GEMCITABINE ;  Surgeon: Twylla Glendia BROCKS, MD;  Location: [facility] ORS;  Service: Urology;  Laterality: N/A;   COLONOSCOPY N/A 10/21/2023   Procedure: COLONOSCOPY;  Surgeon: Therisa Bi, MD;  Location: [facility] ENDOSCOPY;  Service: Gastroenterology;  Laterality: N/A;   COLONOSCOPY N/A 03/05/2024   Procedure: COLONOSCOPY;  Surgeon: Jinny Carmine, MD;  Location: [facility] ENDOSCOPY;  Service: Endoscopy;  Laterality: N/A;   CYSTOSCOPY WITH BIOPSY  N/A 12/27/2020   Procedure: CYSTOSCOPY WITH BLADDER BIOPSY;  Surgeon: Twylla Glendia BROCKS, MD;  Location: [facility] ORS;  Service:  Urology;  Laterality: N/A;   CYSTOSCOPY WITH FULGERATION N/A 12/27/2020   Procedure: CYSTOSCOPY WITH FULGERATION;  Surgeon: Twylla Glendia BROCKS, MD;  Location: [facility] ORS;  Service: Urology;  Laterality: N/A;   CYSTOSCOPY WITH STENT PLACEMENT Right 02/04/2018   Procedure: CYSTOSCOPY;  Surgeon: Twylla Glendia BROCKS, MD;  Location: [facility] ORS;  Service: Urology;  Laterality: Right;   FINGER SURGERY Left 2001   titanium in left 5th finger   FRACTURE SURGERY     HERNIA REPAIR  2000   POLYPECTOMY  03/05/2024   Procedure: POLYPECTOMY, INTESTINE;  Surgeon: Jinny Carmine, MD;  Location: [facility] ENDOSCOPY;  Service: Endoscopy;;   SHOULDER SURGERY Left 2009   TRANSURETHRAL RESECTION OF BLADDER TUMOR N/A 02/04/2018   Procedure: TRANSURETHRAL RESECTION OF BLADDER TUMOR (TURBT) with gemcitabine ;  Surgeon: Twylla Glendia BROCKS, MD;  Location: [facility] ORS;  Service: Urology;  Laterality: N/A;   TRANSURETHRAL RESECTION OF BLADDER TUMOR N/A 10/13/2019   Procedure: TRANSURETHRAL RESECTION OF BLADDER TUMOR (TURBT) with gemcitabine ;  Surgeon: Twylla Glendia BROCKS, MD;  Location: [facility] ORS;  Service: Urology;  Laterality: N/A;   TRANSURETHRAL RESECTION OF BLADDER TUMOR N/A 12/04/2022   Procedure: TRANSURETHRAL RESECTION OF BLADDER TUMOR (TURBT);  Surgeon: Twylla Glendia BROCKS, MD;  Location: [facility] ORS;  Service: Urology;  Laterality: N/A;   VASECTOMY  1987    Home Medications:  Allergies as of 05/06/2024       Reactions   Hydrocodone-acetaminophen  Nausea And Vomiting   Oxycodone  Nausea And Vomiting        Medication List        Accurate as of May 05, 2024  1:13 PM. If you have any questions, ask your nurse or doctor.          aspirin  EC 81 MG tablet Take 81 mg by mouth daily. Swallow whole.   MAGNESIUM MALATE PO Take 1,050 mg by mouth at bedtime.   metoprolol  succinate 25 MG 24 hr tablet Commonly known as: Toprol  XL Take 1 tablet (25 mg total) by mouth daily.   rosuvastatin  5 MG tablet Commonly known as:  Crestor  Take 1 tablet (5 mg total) by mouth daily.   tadalafil  20 MG tablet Commonly known as: CIALIS  1 TAB 1 HOUR PRIOR TO INTERCOURSE        Allergies:  Allergies  Allergen Reactions   Hydrocodone-Acetaminophen  Nausea And Vomiting   Oxycodone  Nausea And Vomiting    Family History: Family History  Problem Relation Age of Onset   Alzheimer's disease Father    Arthritis Mother     Social History:  reports that he quit smoking about 46 years ago. His smoking use included cigarettes. He has never used smokeless tobacco. He reports current alcohol use of about 5.0 standard drinks of alcohol per week. He reports that he does not currently use drugs after having used the following drugs: LSD and Opium.  ROS: Pertinent ROS in HPI  Physical Exam: There were no vitals taken for this visit.  Constitutional:  Well nourished. Alert and oriented, No acute distress. HEENT: [place] AT, moist mucus membranes.  Trachea midline, no masses. Cardiovascular: No clubbing, cyanosis, or edema. Respiratory: Normal respiratory effort, no increased work of breathing. GI: Abdomen is soft, non tender, non distended, no abdominal masses. Liver and spleen not palpable.  No hernias appreciated.  Stool sample for occult testing is not indicated.   GU: No CVA  tenderness.  No bladder fullness or masses.  Patient with circumcised/uncircumcised phallus. ***Foreskin easily retracted***  Urethral meatus is patent.  No penile discharge. No penile lesions or rashes. Scrotum without lesions, cysts, rashes and/or edema.  Testicles are located scrotally bilaterally. No masses are appreciated in the testicles. Left and right epididymis are normal. Rectal: Patient with  normal sphincter tone. Anus and perineum without scarring or rashes. No rectal masses are appreciated. Prostate is approximately *** grams, *** nodules are appreciated. Seminal vesicles are normal. Skin: No rashes, bruises or suspicious lesions. Lymph: No  cervical or inguinal adenopathy. Neurologic: Grossly intact, no focal deficits, moving all 4 extremities. Psychiatric: Normal mood and affect.  Laboratory Data: See Epic and HPI   I have reviewed the labs.   Pertinent Imaging: N/A  Assessment & Plan:  ***  1. Erectile dysfunction -I explained that conditions like diabetes, hypertension, coronary artery disease, peripheral vascular disease, smoking, alcohol consumption, age, sleep apnea and BPH can diminish the ability to have an erection *** -I explained the ED may be a risk marker for underlying CVD and he should follow up with PCP for further studies *** -We will obtain a serum testosterone  level at this time; if it is abnormal we will need to repeat the study for confirmation *** -Explained that moderate to vigorous aerobic exercise for 40 minutes 4 times per week can decrease erectile problems caused by physical inactivity, obesity, hypertension, metabolic syndrome and/or cardiovascular diseases *** -We discussed trying a *** different PDE5 inhibitor, intra-urethral suppositories, ICI, vacuum erection devices, Li-SWT, and penile prosthesis implantation -Initiate tadalafil  [dose, e.g., 10 mg PO as needed prior to sexual activity, not more than once daily].*** -Discussed risks, benefits, and side effects (headache, flushing, dyspepsia, back pain, rare risk of priapism). -Counseled on contraindications: avoid concurrent nitrate therapy. -Encouraged lifestyle modifications: optimize cardiovascular health, exercise, limit alcohol, avoid smoking. -Patient verbalized understanding and agreement with plan.   2. Hypogonadism  - explained that the diagnosis of testosterone  deficiency/hypogonadism requires two morning testosterones at least two days apart below 300 to meet criteria** - explained some insurances also require free and total testosterone , SHBG, FH/LH and prolactin levels as well  - explained some insurances will even require  weight loss, managing diabetes, high blood pressure, sleep apnea and liver disease prior to covering testosterone  therapy - explained that TRT is not a treatment for ED, he may see some improvement in his erections, but his ED will likely persist even with therapeutic levels of testosterone  - discussed potential side effects of testosterone  replacement  including stimulation of erythrocytosis; edema; gynecomastia; worsening sleep apnea; venous thromboembolism; testicular atrophy and infertility.   The theoretical risk of growth stimulation of an undetected prostate cancer was also discussed.  He was informed that current evidence does not provide any definitive answers regarding the risks of testosterone  therapy on prostate cancer and cardiovascular disease. The need for periodic monitoring of his testosterone  level, PSA, hematocrit and DRE was discussed.  This monitoring will be conducted every three months during the first year of TRT and then every 6 months if blood work remains stable, if there is an abnormality found in follow up blood work, it will result in the monitoring of blood work more frequently  - advised that any missed or delayed appointments will also result in delays in the refilling of the TRT as it is a controlled substance - advised that the office requires one week to refill TRT - Near castrate testosterone  levels*** - Significant  symptoms***feeling depressed or tired, having little to no interest in sex, low energy and weak muscles or bones - Recommend starting TRT*** - We discussed the most common forms of replacement including intramuscular injection and gels and he desires to start injections*** - Rx testosterone  cypionate-200 mg every 2 weeks to start*** - Appointment will be made for injection training*** - Follow-up 5 weeks after starting TRT for testosterone  level and symptom check*** - discussed that we follow guidelines for age appropriate testosterone  levels to decide on  dosing regimens for TRT and this is based on current agreements in the medical community and we will not deviate from this unless there is good scientific data to do so [facility] [employer] may not recognize the parameters we use to determine that the patient has low testosterone  and therefore, if they want to pursue TRT, it will have be out-of-pocket    20-39 years ([phone]) 40-49 ([phone]) (5.3-26.3) 50-59 ([phone]) (4.2-22.2) 60-69 (196-859) (3.7-18.9) 70-     ([phone]) (2.2-14.7)  No follow-ups on file.  These notes generated with voice recognition software. I apologize for typographical errors.  Francis Jones  [facility] [address]  [address] [place], [place] [address] [phone]

## 2024-05-06 ENCOUNTER — Ambulatory Visit: Admitting: Urology

## 2024-05-06 ENCOUNTER — Encounter: Payer: Self-pay | Admitting: Urology

## 2024-05-06 ENCOUNTER — Telehealth: Payer: Self-pay | Admitting: Urology

## 2024-05-06 ENCOUNTER — Other Ambulatory Visit: Payer: Self-pay | Admitting: Urology

## 2024-05-06 VITALS — BP 129/84 | HR 90 | Wt 274.0 lb

## 2024-05-06 DIAGNOSIS — B356 Tinea cruris: Secondary | ICD-10-CM

## 2024-05-06 DIAGNOSIS — N5201 Erectile dysfunction due to arterial insufficiency: Secondary | ICD-10-CM

## 2024-05-06 DIAGNOSIS — E291 Testicular hypofunction: Secondary | ICD-10-CM | POA: Diagnosis not present

## 2024-05-06 LAB — TESTOSTERONE: Testosterone: 243 ng/dL — ABNORMAL LOW (ref 264–916)

## 2024-05-06 MED ORDER — NYSTATIN 100000 UNIT/GM EX CREA: 1.0000 | TOPICAL_CREAM | Freq: Two times a day (BID) | CUTANEOUS | 0 refills | Status: AC

## 2024-05-06 MED ORDER — TESTOSTERONE 25 MG/2.5GM (1%) TD GEL: TRANSDERMAL | 1 refills | Status: DC

## 2024-05-06 MED ORDER — TADALAFIL 20 MG PO TABS: 20.0000 mg | ORAL_TABLET | Freq: Every day | ORAL | 3 refills | Status: DC | PRN

## 2024-05-06 NOTE — Telephone Encounter (Signed)
 Patient called and stated that he would prefer Testosterone  Gel rather than the injections. Patient would also like to know if script can be sent to pharmacy today. Please advise.

## 2024-05-13 ENCOUNTER — Other Ambulatory Visit: Payer: Self-pay | Admitting: Urology

## 2024-05-13 DIAGNOSIS — E291 Testicular hypofunction: Secondary | ICD-10-CM

## 2024-05-13 MED ORDER — TESTOSTERONE 25 MG/2.5GM (1%) TD GEL: TRANSDERMAL | 1 refills | Status: DC

## 2024-05-19 ENCOUNTER — Other Ambulatory Visit: Payer: Self-pay | Admitting: Urology

## 2024-05-19 DIAGNOSIS — E291 Testicular hypofunction: Secondary | ICD-10-CM

## 2024-05-19 MED ORDER — TESTOSTERONE 25 MG/2.5GM (1%) TD GEL: TRANSDERMAL | 1 refills | Status: AC

## 2024-05-21 DIAGNOSIS — H2513 Age-related nuclear cataract, bilateral: Secondary | ICD-10-CM | POA: Diagnosis not present

## 2024-05-21 DIAGNOSIS — D23122 Other benign neoplasm of skin of left lower eyelid, including canthus: Secondary | ICD-10-CM | POA: Diagnosis not present

## 2024-05-21 DIAGNOSIS — H43813 Vitreous degeneration, bilateral: Secondary | ICD-10-CM | POA: Diagnosis not present

## 2024-07-01 ENCOUNTER — Other Ambulatory Visit: Payer: Self-pay | Admitting: Urology

## 2024-07-01 DIAGNOSIS — N5201 Erectile dysfunction due to arterial insufficiency: Secondary | ICD-10-CM

## 2024-07-01 NOTE — Telephone Encounter (Signed)
Dx: N52.9

## 2024-07-13 ENCOUNTER — Encounter: Payer: Self-pay | Admitting: Family Medicine

## 2024-07-15 ENCOUNTER — Ambulatory Visit: Admitting: Family Medicine

## 2024-07-15 ENCOUNTER — Encounter: Payer: Self-pay | Admitting: Family Medicine

## 2024-07-15 VITALS — BP 115/92 | HR 78 | Temp 97.6°F | Ht 72.0 in | Wt 280.0 lb

## 2024-07-15 DIAGNOSIS — R4689 Other symptoms and signs involving appearance and behavior: Secondary | ICD-10-CM

## 2024-07-15 DIAGNOSIS — D582 Other hemoglobinopathies: Secondary | ICD-10-CM

## 2024-07-15 DIAGNOSIS — R5382 Chronic fatigue, unspecified: Secondary | ICD-10-CM

## 2024-07-15 DIAGNOSIS — I4821 Permanent atrial fibrillation: Secondary | ICD-10-CM

## 2024-07-15 DIAGNOSIS — C678 Malignant neoplasm of overlapping sites of bladder: Secondary | ICD-10-CM

## 2024-07-15 DIAGNOSIS — R7303 Prediabetes: Secondary | ICD-10-CM

## 2024-07-15 DIAGNOSIS — G629 Polyneuropathy, unspecified: Secondary | ICD-10-CM

## 2024-07-15 DIAGNOSIS — E291 Testicular hypofunction: Secondary | ICD-10-CM

## 2024-07-15 DIAGNOSIS — B356 Tinea cruris: Secondary | ICD-10-CM

## 2024-07-15 DIAGNOSIS — Z7189 Other specified counseling: Secondary | ICD-10-CM

## 2024-07-15 DIAGNOSIS — N529 Male erectile dysfunction, unspecified: Secondary | ICD-10-CM

## 2024-07-15 DIAGNOSIS — E782 Mixed hyperlipidemia: Secondary | ICD-10-CM

## 2024-07-15 DIAGNOSIS — I700 Atherosclerosis of aorta: Secondary | ICD-10-CM

## 2024-07-15 MED ORDER — TERBINAFINE HCL 250 MG PO TABS: 250.0000 mg | ORAL_TABLET | Freq: Every day | ORAL | 0 refills | Status: AC

## 2024-07-15 NOTE — Assessment & Plan Note (Signed)
 Noted. Continues to follow with Dr. Twylla with regular cystoscopy to manage ongoing microscopic-level recurrence.

## 2024-07-15 NOTE — Progress Notes (Unsigned)
 "     Established patient visit   Patient: Francis Jones   DOB: 02/20/1951   73 y.o. Male  MRN: [id number] Visit Date: 07/15/2024  Today's healthcare provider: LAURAINE LOISE BUOY, DO   Chief Complaint  Patient presents with   Medical Management of Chronic Issues    Patient is here for a 3 month follow up regarding chronic management.   Subjective    HPI Francis Jones is a 74 year old male who presents for a follow-up visit to discuss blood work and ongoing health concerns.  He experiences significant lack of motivation, describing it as having 'zero motivation to do anything.' He finds it challenging to initiate tasks despite having responsibilities and deadlines.  He is taking vitamin D  at a dose of 5000 units daily and is interested in checking vitamin D , magnesium, B12, testosterone , folate, and iron levels. He has been using testosterone  cream following low testosterone  levels confirmed by blood work. Two tests showed very low levels, leading to the initiation of testosterone  therapy.  He reports recent swelling in his feet, which he noticed when trying to wear older boots. This swelling is a new issue for him.  He has a history of a rash for which he has used nystatin  cream and over-the-counter Lamisil . The creams help control the rash, but he would like to reach a point where he no longer needs to use them.  He underwent a cystoscopy recently.  He has a history of snoring and has previously undergone a sleep study that showed he was borderline for treatment. He is currently pursuing an at-home sleep study to assess his current status.  He discusses anticipatory grief related to his deceased wife, who experienced body-wide pain before her death, affecting his ability to comfort each other. He has been in a relationship with Verneita since six months after his wife's passing.  No new numbness, tingling, weakness, shaking, memory concerns, pain, or shortness of breath. Reports  normal numbness in feet.    ***  {History (Optional):23778}  Medications: Show/hide medication list[1]   {Insert previous labs (optional):23779} {See past labs  Heme  Chem  Endocrine  Serology  Results Review (optional):1}   Objective    BP (!) 115/92 (BP Location: Left Arm, Patient Position: Sitting, Cuff Size: Large)   Pulse 78   Temp 97.6 F (36.4 C) (Oral)   Ht 6' (1.829 m)   Wt 280 lb (127 kg)   SpO2 98%   BMI 37.97 kg/m  {Insert last BP/Wt (optional):23777}{See vitals history (optional):1}   Physical Exam Vitals and nursing note reviewed.  Constitutional:      General: He is not in acute distress.    Appearance: Normal appearance.  HENT:     Head: Normocephalic and atraumatic.  Eyes:     General: No scleral icterus.    Conjunctiva/sclera: Conjunctivae normal.  Cardiovascular:     Rate and Rhythm: Normal rate.  Pulmonary:     Effort: Pulmonary effort is normal.  Neurological:     Mental Status: He is alert and oriented to person, place, and time. Mental status is at baseline.  Psychiatric:        Mood and Affect: Mood normal.        Behavior: Behavior normal.      No results found for any visits on 07/15/24.  Assessment & Plan    Permanent atrial fibrillation [facility])  Aortic atherosclerosis  Peripheral polyneuropathy  Cancer of overlapping sites of bladder (HCC)  Prediabetes  Obesity, morbid (HCC)  Mixed hyperlipidemia      Peripheral polyneuropathy Chronic condition with numbness and tingling in feet. Reports swelling in feet. - Ordered blood work: B12, folate, iron levels.  Chronic fatigue Persistent fatigue possibly linked to metoprolol . - Ordered blood work: testosterone , B12, folate, iron levels.  Hypogonadism in male Low testosterone  managed with topical cream. Recent levels very low. - Ordered blood work: testosterone  levels.  Tinea cruris Recurrent condition with similar reactions to creams. Discussed oral antifungal  option and liver toxicity risk. - Prescribed oral antifungal for one week.  Mixed hyperlipidemia Monitoring. - Ordered blood work: cholesterol panel.  Elevated hemoglobin Previous elevated levels noted. - Ordered blood work: complete blood count.  General health maintenance Discussed vitamin D  and shingles vaccination. Declined second shingles dose. - Continue vitamin D  5000 units daily. ***  No follow-ups on file.      I discussed the assessment and treatment plan with the patient  The patient was provided an opportunity to ask questions and all were answered. The patient agreed with the plan and demonstrated an understanding of the instructions.   The patient was advised to call back or seek an in-person evaluation if the symptoms worsen or if the condition fails to improve as anticipated.    LAURAINE LOISE BUOY, DO  [facility] [phone] (phone) [phone] (fax)  [facility]    [1]  Outpatient Medications Prior to Visit  Medication Sig   aspirin  EC 81 MG tablet Take 81 mg by mouth daily. Swallow whole.   MAGNESIUM MALATE PO Take 1,050 mg by mouth at bedtime.   metoprolol  succinate (TOPROL  XL) 25 MG 24 hr tablet Take 1 tablet (25 mg total) by mouth daily.   nystatin  cream (MYCOSTATIN ) Apply 1 Application topically 2 (two) times daily.   rosuvastatin  (CRESTOR ) 5 MG tablet Take 1 tablet (5 mg total) by mouth daily.   tadalafil  (CIALIS ) 20 MG tablet TAKE 1 TABLET BY MOUTH EVERY DAY AS NEEDED FOR ERECTILE DYSFUNCTION   Testosterone  25 MG/2.5GM (1%) GEL Application: Apply one packet daily in the morning to clean, dry, intact skin of the shoulders, upper arms, and/or abdomen. Do NOT apply to genitals, chest, armpits, knees, or back. Wash hands immediately after application. Cover the application site with clothing after the gel dries to prevent secondary exposure to others.   [DISCONTINUED] tadalafil  (CIALIS ) 20 MG tablet 1 TAB 1 HOUR  PRIOR TO INTERCOURSE   No facility-administered medications prior to visit.   "

## 2024-07-16 ENCOUNTER — Other Ambulatory Visit: Payer: Self-pay | Admitting: Cardiology

## 2024-07-16 ENCOUNTER — Encounter: Payer: Self-pay | Admitting: Internal Medicine

## 2024-07-16 DIAGNOSIS — I4891 Unspecified atrial fibrillation: Secondary | ICD-10-CM

## 2024-07-16 LAB — CBC
Hematocrit: 48.1 % (ref 37.5–51.0)
Hemoglobin: 16.1 g/dL (ref 13.0–17.7)
MCH: 30.1 pg (ref 26.6–33.0)
MCHC: 33.5 g/dL (ref 31.5–35.7)
MCV: 90 fL (ref 79–97)
Platelets: 210 10*3/uL (ref 150–450)
RBC: 5.34 x10E6/uL (ref 4.14–5.80)
RDW: 12.4 % (ref 11.6–15.4)
WBC: 7.9 10*3/uL (ref 3.4–10.8)

## 2024-07-16 LAB — LIPID PANEL WITH LDL/HDL RATIO
Cholesterol, Total: 174 mg/dL (ref 100–199)
HDL: 55 mg/dL
LDL Chol Calc (NIH): 100 mg/dL — ABNORMAL HIGH (ref 0–99)
LDL/HDL Ratio: 1.8 ratio (ref 0.0–3.6)
Triglycerides: 103 mg/dL (ref 0–149)
VLDL Cholesterol Cal: 19 mg/dL (ref 5–40)

## 2024-07-16 LAB — COMPREHENSIVE METABOLIC PANEL WITH GFR
ALT: 19 [IU]/L (ref 0–44)
AST: 19 [IU]/L (ref 0–40)
Albumin: 4.3 g/dL (ref 3.8–4.8)
Alkaline Phosphatase: 74 [IU]/L (ref 47–123)
BUN/Creatinine Ratio: 14 (ref 10–24)
BUN: 12 mg/dL (ref 8–27)
Bilirubin Total: 0.4 mg/dL (ref 0.0–1.2)
CO2: 21 mmol/L (ref 20–29)
Calcium: 9 mg/dL (ref 8.6–10.2)
Chloride: 105 mmol/L (ref 96–106)
Creatinine, Ser: 0.84 mg/dL (ref 0.76–1.27)
Globulin, Total: 2.4 g/dL (ref 1.5–4.5)
Glucose: 129 mg/dL — ABNORMAL HIGH (ref 70–99)
Potassium: 4.7 mmol/L (ref 3.5–5.2)
Sodium: 145 mmol/L — ABNORMAL HIGH (ref 134–144)
Total Protein: 6.7 g/dL (ref 6.0–8.5)
eGFR: 92 mL/min/{1.73_m2}

## 2024-07-16 LAB — DHEA

## 2024-07-16 LAB — IRON,TIBC AND FERRITIN PANEL
Ferritin: 166 ng/mL (ref 30–400)
Iron Saturation: 32 % (ref 15–55)
Iron: 96 ug/dL (ref 38–169)
Total Iron Binding Capacity: 300 ug/dL (ref 250–450)
UIBC: 204 ug/dL (ref 111–343)

## 2024-07-16 LAB — B12 AND FOLATE PANEL
Folate: 13 ng/mL
Vitamin B-12: 1143 pg/mL (ref 232–1245)

## 2024-07-16 LAB — C-REACTIVE PROTEIN: CRP: 3 mg/L (ref 0–10)

## 2024-07-16 LAB — HEMOGLOBIN A1C
Est. average glucose Bld gHb Est-mCnc: 148 mg/dL
Hgb A1c MFr Bld: 6.8 % — ABNORMAL HIGH (ref 4.8–5.6)

## 2024-07-16 LAB — TESTOSTERONE: Testosterone: 320 ng/dL (ref 264–916)

## 2024-07-16 LAB — ESTRADIOL: Estradiol: 30.1 pg/mL (ref 7.6–42.6)

## 2024-07-16 NOTE — Telephone Encounter (Signed)
Scheduled 02/11

## 2024-07-16 NOTE — Telephone Encounter (Signed)
Please contact pt for future appointment. Pt overdue for follow up.

## 2024-07-22 ENCOUNTER — Ambulatory Visit: Admitting: Cardiology

## 2024-08-05 ENCOUNTER — Ambulatory Visit: Admitting: Cardiology

## 2024-10-13 ENCOUNTER — Encounter

## 2024-10-30 ENCOUNTER — Other Ambulatory Visit: Admitting: Urology
